# Patient Record
Sex: Female | Born: 1960 | Race: Black or African American | Hispanic: No | Marital: Single | Smoking: Never smoker
Health system: Southern US, Community
[De-identification: ages and names within clinical notes are randomized; demographics above are authoritative.]

## PROBLEM LIST (undated history)

## (undated) DIAGNOSIS — R0602 Shortness of breath: Secondary | ICD-10-CM

## (undated) DIAGNOSIS — I428 Other cardiomyopathies: Secondary | ICD-10-CM

## (undated) DIAGNOSIS — G4733 Obstructive sleep apnea (adult) (pediatric): Secondary | ICD-10-CM

## (undated) DIAGNOSIS — D649 Anemia, unspecified: Secondary | ICD-10-CM

## (undated) DIAGNOSIS — I517 Cardiomegaly: Secondary | ICD-10-CM

## (undated) DIAGNOSIS — G4730 Sleep apnea, unspecified: Secondary | ICD-10-CM

## (undated) DIAGNOSIS — E119 Type 2 diabetes mellitus without complications: Secondary | ICD-10-CM

## (undated) DIAGNOSIS — M1990 Unspecified osteoarthritis, unspecified site: Secondary | ICD-10-CM

## (undated) DIAGNOSIS — R7303 Prediabetes: Secondary | ICD-10-CM

## (undated) DIAGNOSIS — E785 Hyperlipidemia, unspecified: Secondary | ICD-10-CM

## (undated) DIAGNOSIS — E039 Hypothyroidism, unspecified: Secondary | ICD-10-CM

## (undated) DIAGNOSIS — I447 Left bundle-branch block, unspecified: Secondary | ICD-10-CM

## (undated) DIAGNOSIS — I429 Cardiomyopathy, unspecified: Secondary | ICD-10-CM

## (undated) DIAGNOSIS — I5042 Chronic combined systolic (congestive) and diastolic (congestive) heart failure: Secondary | ICD-10-CM

## (undated) DIAGNOSIS — J45909 Unspecified asthma, uncomplicated: Secondary | ICD-10-CM

## (undated) DIAGNOSIS — I10 Essential (primary) hypertension: Secondary | ICD-10-CM

## (undated) DIAGNOSIS — E663 Overweight: Secondary | ICD-10-CM

## (undated) HISTORY — PX: OVARIAN CYST REMOVAL: SHX89

## (undated) HISTORY — DX: Hypothyroidism, unspecified: E03.9

## (undated) HISTORY — DX: Other cardiomyopathies: I42.8

## (undated) HISTORY — DX: Unspecified osteoarthritis, unspecified site: M19.90

## (undated) HISTORY — DX: Type 2 diabetes mellitus without complications: E11.9

## (undated) HISTORY — PX: ACHILLES TENDON SURGERY: SHX542

## (undated) HISTORY — DX: Overweight: E66.3

## (undated) HISTORY — DX: Sleep apnea, unspecified: G47.30

## (undated) HISTORY — DX: Shortness of breath: R06.02

## (undated) HISTORY — DX: Unspecified asthma, uncomplicated: J45.909

## (undated) HISTORY — DX: Left bundle-branch block, unspecified: I44.7

## (undated) HISTORY — DX: Chronic combined systolic (congestive) and diastolic (congestive) heart failure: I50.42

## (undated) HISTORY — DX: Cardiomyopathy, unspecified: I42.9

## (undated) HISTORY — PX: KNEE ARTHROSCOPY WITH MENISCAL REPAIR: SHX5653

## (undated) HISTORY — DX: Essential (primary) hypertension: I10

## (undated) HISTORY — DX: Prediabetes: R73.03

## (undated) HISTORY — DX: Anemia, unspecified: D64.9

## (undated) HISTORY — DX: Hypercalcemia: E83.52

## (undated) HISTORY — DX: Obstructive sleep apnea (adult) (pediatric): G47.33

## (undated) HISTORY — DX: Cardiomegaly: I51.7

## (undated) HISTORY — DX: Hyperlipidemia, unspecified: E78.5

---

## 2015-06-27 ENCOUNTER — Other Ambulatory Visit: Payer: Self-pay

## 2015-06-27 DIAGNOSIS — Z1231 Encounter for screening mammogram for malignant neoplasm of breast: Secondary | ICD-10-CM

## 2015-07-25 ENCOUNTER — Ambulatory Visit
Admission: RE | Admit: 2015-07-25 | Discharge: 2015-07-25 | Disposition: A | Discharge status: Home / Self Care | Payer: 59 | Source: Ambulatory Visit

## 2015-07-25 DIAGNOSIS — Z1231 Encounter for screening mammogram for malignant neoplasm of breast: Secondary | ICD-10-CM

## 2016-09-12 ENCOUNTER — Other Ambulatory Visit: Payer: Self-pay | Admitting: Family Medicine

## 2016-09-12 DIAGNOSIS — Z1231 Encounter for screening mammogram for malignant neoplasm of breast: Secondary | ICD-10-CM

## 2016-10-09 ENCOUNTER — Ambulatory Visit
Admission: RE | Admit: 2016-10-09 | Discharge: 2016-10-09 | Disposition: A | Discharge status: Home / Self Care | Payer: 59 | Source: Ambulatory Visit | Attending: Family Medicine | Admitting: Family Medicine

## 2016-10-09 DIAGNOSIS — Z1231 Encounter for screening mammogram for malignant neoplasm of breast: Secondary | ICD-10-CM

## 2017-03-14 ENCOUNTER — Other Ambulatory Visit: Payer: Self-pay | Admitting: Family Medicine

## 2017-03-14 DIAGNOSIS — R221 Localized swelling, mass and lump, neck: Secondary | ICD-10-CM

## 2017-03-18 ENCOUNTER — Ambulatory Visit
Admission: RE | Admit: 2017-03-18 | Discharge: 2017-03-18 | Disposition: A | Discharge status: Home / Self Care | Payer: 59 | Source: Ambulatory Visit | Attending: Family Medicine | Admitting: Family Medicine

## 2017-03-18 DIAGNOSIS — R221 Localized swelling, mass and lump, neck: Secondary | ICD-10-CM

## 2017-03-18 MED ORDER — IOPAMIDOL (ISOVUE-300) INJECTION 61%
75.0000 mL | Freq: Once | INTRAVENOUS | Status: AC | PRN
  Administered 2017-03-18: 75 mL via INTRAVENOUS

## 2017-04-21 ENCOUNTER — Encounter: Payer: Self-pay | Admitting: Dietician

## 2017-04-21 ENCOUNTER — Encounter: Payer: 59 | Attending: Family Medicine | Admitting: Dietician

## 2017-04-21 DIAGNOSIS — Z713 Dietary counseling and surveillance: Secondary | ICD-10-CM | POA: Diagnosis not present

## 2017-04-21 DIAGNOSIS — E7800 Pure hypercholesterolemia, unspecified: Secondary | ICD-10-CM | POA: Diagnosis not present

## 2017-04-21 DIAGNOSIS — R7303 Prediabetes: Secondary | ICD-10-CM | POA: Diagnosis not present

## 2017-04-21 DIAGNOSIS — I10 Essential (primary) hypertension: Secondary | ICD-10-CM | POA: Insufficient documentation

## 2017-04-21 NOTE — Patient Instructions (Signed)
Rethink what you drink.  Be sure to stay hydrated. Continue to stay active Bake, broil, grill rather than fry. Eat slowly.  Plan:  Aim for 3 Carb Choices per meal (45 grams) +/- 1 either way  Aim for 0-1 Carbs per snack if hungry  Include protein in moderation with your meals and snacks Consider reading food labels for Total Carbohydrate and Fat Grams of foods

## 2017-04-21 NOTE — Progress Notes (Signed)
  Medical Nutrition Therapy:  Appt start time: 1308 end time:  1630.   Assessment:  Primary concerns today: Patient is here today with her husband.  She would like to lose weight.  She has been recently diagnosed with prediabetes with an A1C of 6.4%.  She is going for a sleep study this week to evaluate OSA.  Other hx includes hypothyroid.  She states that her medication has been increased but she does not feel better.  She also has HTN and high cholesterol.    Weight hx: 198 lbs today and highest weight 190 lbs 09/2014 and lost 20 lbs within 6 months of moving  She made no changes and does not know how she lost weight. Regained summer 2016. Goal 140-150 lbs.  Patient lives with her husband.  They share shopping and cooking.  She works for the [employer].  She gets an average of 12,000 steps daily at work.  Her husband has diabetes.  Preferred Learning Style:   No preference indicated   Learning Readiness:   Not ready  Contemplating  Ready  Change in progress   MEDICATIONS: see list   DIETARY INTAKE:  Usual eating pattern includes 3 meals and 0 snacks per day. 24-hr recall:  B (8:30 AM):  Plain instant oatmeal with liquid creamer, yogurt  Snk (10:30AM): Fig bar or granola bar, cup of hot tea with lemon and equal  L ( PM): leftovers OR [place] sandwich, chips Snk ( PM): none D ( PM): Meat, starch, vegetable Snk ( PM): none Beverages: Armold Palmar iced tea, water, rare juice  Usual physical activity: none   Progress Towards Goal(s):  In progress.   Nutritional Diagnosis:  NB-1.1 Food and nutrition-related knowledge deficit As related to prediabetes.  As evidenced by patient report and diet hx.    Intervention:  Nutrition counseling and diabetes education initiated. Discussed Carb Counting by food group as method of portion control, reading food labels, and benefits of continued increased activity. Also discussed basic physiology of Diabetes, target BG ranges pre and post  meals, and A1c.   Rethink what you drink.  Be sure to stay hydrated. Continue to stay active Bake, broil, grill rather than fry. Eat slowly.  Plan:  Aim for 3 Carb Choices per meal (45 grams) +/- 1 either way  Aim for 0-1 Carbs per snack if hungry  Include protein in moderation with your meals and snacks Consider reading food labels for Total Carbohydrate and Fat Grams of foods  Teaching Method Utilized:  Visual Auditory Hands on  Handouts given during visit include: Living Well with Diabetes handouts Food Label handouts Meal Plan Card  My plate  M5H sheet  Barriers to learning/adherence to lifestyle change: long hours  Demonstrated degree of understanding via:  Teach Back   Monitoring/Evaluation:  Dietary intake, exercise, label reading, and body weight prn.

## 2017-06-09 ENCOUNTER — Emergency Department (HOSPITAL_BASED_OUTPATIENT_CLINIC_OR_DEPARTMENT_OTHER)
Admission: EM | Admit: 2017-06-09 | Discharge: 2017-06-09 | Disposition: A | Discharge status: Home / Self Care | Payer: 59 | Attending: Emergency Medicine | Admitting: Emergency Medicine

## 2017-06-09 ENCOUNTER — Emergency Department (HOSPITAL_BASED_OUTPATIENT_CLINIC_OR_DEPARTMENT_OTHER): Payer: 59

## 2017-06-09 ENCOUNTER — Encounter (HOSPITAL_BASED_OUTPATIENT_CLINIC_OR_DEPARTMENT_OTHER): Payer: Self-pay | Admitting: *Deleted

## 2017-06-09 DIAGNOSIS — R42 Dizziness and giddiness: Secondary | ICD-10-CM | POA: Diagnosis present

## 2017-06-09 DIAGNOSIS — Z79899 Other long term (current) drug therapy: Secondary | ICD-10-CM | POA: Insufficient documentation

## 2017-06-09 DIAGNOSIS — J45909 Unspecified asthma, uncomplicated: Secondary | ICD-10-CM | POA: Insufficient documentation

## 2017-06-09 DIAGNOSIS — I10 Essential (primary) hypertension: Secondary | ICD-10-CM | POA: Insufficient documentation

## 2017-06-09 DIAGNOSIS — R1110 Vomiting, unspecified: Secondary | ICD-10-CM | POA: Insufficient documentation

## 2017-06-09 LAB — CBC WITH DIFFERENTIAL/PLATELET
BASOS ABS: 0 10*3/uL (ref 0.0–0.1)
BASOS PCT: 0 %
EOS ABS: 0 10*3/uL (ref 0.0–0.7)
Eosinophils Relative: 0 %
HEMATOCRIT: 40.9 % (ref 36.0–46.0)
HEMOGLOBIN: 13.6 g/dL (ref 12.0–15.0)
Lymphocytes Relative: 9 %
Lymphs Abs: 0.8 10*3/uL (ref 0.7–4.0)
MCH: 27.6 pg (ref 26.0–34.0)
MCHC: 33.3 g/dL (ref 30.0–36.0)
MCV: 83.1 fL (ref 78.0–100.0)
MONO ABS: 0.2 10*3/uL (ref 0.1–1.0)
MONOS PCT: 2 %
NEUTROS PCT: 89 %
Neutro Abs: 7.5 10*3/uL (ref 1.7–7.7)
Platelets: 312 10*3/uL (ref 150–400)
RBC: 4.92 MIL/uL (ref 3.87–5.11)
RDW: 14.9 % (ref 11.5–15.5)
WBC: 8.5 10*3/uL (ref 4.0–10.5)

## 2017-06-09 LAB — BASIC METABOLIC PANEL
Anion gap: 9 (ref 5–15)
BUN: 17 mg/dL (ref 6–20)
CHLORIDE: 104 mmol/L (ref 101–111)
CO2: 23 mmol/L (ref 22–32)
CREATININE: 0.94 mg/dL (ref 0.44–1.00)
Calcium: 10.1 mg/dL (ref 8.9–10.3)
GFR calc Af Amer: >60 mL/min (ref >60)
GFR calc non Af Amer: >60 mL/min (ref >60)
GLUCOSE: 138 mg/dL — AB (ref 65–99)
Potassium: 3.9 mmol/L (ref 3.5–5.1)
Sodium: 136 mmol/L (ref 135–145)

## 2017-06-09 MED ORDER — ONDANSETRON HCL 4 MG/2ML IJ SOLN
4.0000 mg | Freq: Once | INTRAMUSCULAR | Status: AC
  Administered 2017-06-09: 4 mg via INTRAVENOUS
  Filled 2017-06-09: qty 2

## 2017-06-09 MED ORDER — MECLIZINE HCL 25 MG PO TABS
25.0000 mg | ORAL_TABLET | Freq: Once | ORAL | Status: AC
Start: 2017-06-09 — End: 2017-06-09
  Administered 2017-06-09: 25 mg via ORAL
  Filled 2017-06-09: qty 1

## 2017-06-09 MED ORDER — SODIUM CHLORIDE 0.9 % IV SOLN: INTRAVENOUS | Status: DC

## 2017-06-09 MED ORDER — SODIUM CHLORIDE 0.9 % IV BOLUS (SEPSIS)
250.0000 mL | Freq: Once | INTRAVENOUS | Status: AC
  Administered 2017-06-09: 250 mL via INTRAVENOUS

## 2017-06-09 MED ORDER — MECLIZINE HCL 25 MG PO TABS: 25.0000 mg | ORAL_TABLET | Freq: Three times a day (TID) | ORAL | 0 refills | Status: AC | PRN

## 2017-06-09 NOTE — ED Provider Notes (Signed)
[facility] Provider Note   CSN: [id number] Arrival date & time: 06/09/17  1726     History   Chief Complaint Chief Complaint  Patient presents with  . Emesis  . Dizziness    HPI Lindsay Hayden is a 56 y.o. female.  Patient with the acute onset of room spinning and vomiting that started at 11:30 this morning. Was not present when she first got up. Associated with a very mild headache. Associated with several episodes of nausea and vomiting. Patient does better if she lays still. Patient denies any speech problems or any significant weakness or numbness. Patient without similar history in the past. Patient does have the risk factors of hypertension and hyperlipidemia and prediabetes.  Patient when laying still still feels much better. Patient denies any fevers. Denies any upper respiratory infection symptoms. Denies any ringing in her ears. No injury or fall.      Past Medical History:  Diagnosis Date  . Asthma   . Hyperlipidemia   . Hypertension   . OSA (obstructive sleep apnea)   . Prediabetes     There are no active problems to display for this patient.   History reviewed. No pertinent surgical history.  OB History    No data available       Home Medications    Prior to Admission medications   Medication Sig Start Date End Date Taking? Authorizing Provider  albuterol (PROVENTIL HFA;VENTOLIN HFA) 108 (90 Base) MCG/ACT inhaler Inhale into the lungs every 6 (six) hours as needed for wheezing or shortness of breath.    [provider]  atorvastatin (LIPITOR) 10 MG tablet Take 10 mg by mouth daily.    [provider]  cholecalciferol (VITAMIN D) 1000 units tablet Take 5,000 Units by mouth daily.    [provider]  enalapril (VASOTEC) 20 MG tablet Take 20 mg by mouth daily.    [provider]  furosemide (LASIX) 20 MG tablet Take 20 mg by mouth.    [provider]  hydrALAZINE (APRESOLINE) 25 MG tablet  Take 25 mg by mouth 3 (three) times daily.    [provider]  levothyroxine (SYNTHROID, LEVOTHROID) 75 MCG tablet Take 75 mcg by mouth daily before breakfast.    [provider]  meclizine (ANTIVERT) 25 MG tablet Take 1 tablet (25 mg total) by mouth 3 (three) times daily as needed for dizziness. 06/09/17   Fredia Sorrow, MD  metoprolol succinate (TOPROL-XL) 25 MG 24 hr tablet Take 25 mg by mouth daily.    [provider]  spironolactone (ALDACTONE) 25 MG tablet Take 25 mg by mouth daily.    [provider]    Family History No family history on file.  Social History Social History  Substance Use Topics  . Smoking status: Never Smoker  . Smokeless tobacco: Never Used  . Alcohol use Yes     Allergies   Patient has no known allergies.   Review of Systems Review of Systems  Constitutional: Negative for fatigue and fever.  HENT: Negative for congestion and sore throat.   Eyes: Negative for redness.  Respiratory: Negative for shortness of breath.   Cardiovascular: Negative for chest pain.  Gastrointestinal: Positive for nausea and vomiting. Negative for abdominal pain.  Genitourinary: Negative for dysuria.  Musculoskeletal: Negative for back pain and neck pain.  Skin: Negative for rash.  Neurological: Positive for dizziness and headaches. Negative for facial asymmetry, speech difficulty, weakness and numbness.  Hematological: Does not bruise/bleed easily.  Psychiatric/Behavioral: Negative for confusion.     Physical Exam Updated Vital Signs BP (!) 180/86   Pulse 64   Temp 98.1 F (36.7 C) (Oral)   Resp 16   Ht 1.575 m (5\' 2" )   Wt 87.1 kg (192 lb)   SpO2 97%   BMI 35.12 kg/m   Physical Exam  Constitutional: She is oriented to person, place, and time. She appears well-developed and well-nourished. No distress.  HENT:  Head: Normocephalic and atraumatic.  Mouth/Throat: Oropharynx is clear and moist.  Eyes: Pupils are equal,  round, and reactive to light. Conjunctivae and EOM are normal.  Neck: Normal range of motion. Neck supple.  Cardiovascular: Normal rate, regular rhythm and normal heart sounds.   Pulmonary/Chest: Effort normal and breath sounds normal. No respiratory distress.  Abdominal: Soft. Bowel sounds are normal. There is no tenderness.  Musculoskeletal: Normal range of motion.  Neurological: She is alert and oriented to person, place, and time. No cranial nerve deficit or sensory deficit. She exhibits normal muscle tone. Coordination normal.  Reproducible dizziness room spinning with moving head left to right. Also with movement of eyes following a crack emotions.  Skin: Skin is warm.  Nursing note and vitals reviewed.    ED Treatments / Results  Labs (all labs ordered are listed, but only abnormal results are displayed) Labs Reviewed  BASIC METABOLIC PANEL - Abnormal; Notable for the following:       Result Value   Glucose, Bld 138 (*)    All other components within normal limits  CBC WITH DIFFERENTIAL/PLATELET    EKG  EKG Interpretation None       Radiology Ct Head Wo Contrast  Result Date: 06/09/2017 CLINICAL DATA:  Ataxia with frontal headache EXAM: CT HEAD WITHOUT CONTRAST TECHNIQUE: Contiguous axial images were obtained from the base of the skull through the vertex without intravenous contrast. COMPARISON:  None. FINDINGS: Brain: No acute territorial infarction, hemorrhage or intracranial mass is seen. Scattered hypodensity within the bilateral white matter suggestive of small vessel ischemic change. The ventricles are nonenlarged. Vascular: No hyperdense vessels.  No unexpected calcification Skull: Normal. Negative for fracture or focal lesion. Sinuses/Orbits: Mild mucosal thickening in the ethmoid sinuses. No acute orbital abnormality Other: None IMPRESSION: No CT evidence for acute intracranial abnormality Electronically Signed   By: Donavan Foil M.D.   On: 06/09/2017 19:29     Procedures Procedures (including critical care time)  Medications Ordered in ED Medications  0.9 %  sodium chloride infusion (not administered)  sodium chloride 0.9 % bolus 250 mL (0 mLs Intravenous Stopped 06/09/17 2019)  ondansetron (ZOFRAN) injection 4 mg (4 mg Intravenous Given 06/09/17 1912)  meclizine (ANTIVERT) tablet 25 mg (25 mg Oral Given 06/09/17 1912)     Initial Impression / Assessment and Plan / ED Course  I have reviewed the triage vital signs and the nursing notes.  Pertinent labs & imaging results that were available during my care of the patient were reviewed by me and considered in my medical decision making (see chart for details).     Cardiac monitor without any arrhythmia.  Patient symptoms consistent with vertigo. Head CT negative. Patient with resolution of symptoms with meclizine orally. Able to ambulate without difficulty. As discussed with patient not small stroke is not been completely ruled out. Do not feel the patient needs to be sent in for MRI. She will follow-up with her doctors and also given referral to neurology. Will return for any new or worse symptoms.  Will continue on them meclizine.  Final Clinical Impressions(s) / ED Diagnoses   Final diagnoses:  Vertigo    New Prescriptions New Prescriptions   MECLIZINE (ANTIVERT) 25 MG TABLET    Take 1 tablet (25 mg total) by mouth 3 (three) times daily as needed for dizziness.     Fredia Sorrow, MD 06/09/17 2049

## 2017-06-09 NOTE — ED Triage Notes (Signed)
States she has been dizzy since this am. Vomiting. Symptoms all day.

## 2017-06-09 NOTE — ED Notes (Signed)
ED Provider at bedside. 

## 2017-06-09 NOTE — Discharge Instructions (Signed)
It will be important to follow-up with either your doctor or neurology for the vertigo symptoms. May require MRI. Return for any new or worse symptoms. As we discussed small stroke not completely ruled out. Take the meclizine as directed. Rest at home and be careful with walking.

## 2017-06-09 NOTE — ED Notes (Signed)
Pt verbalized understanding of discharge instructions and denies any further questions at this time.   

## 2017-06-09 NOTE — ED Notes (Signed)
Pt ambulated to bathroom with one assist. Slow but steady gait. States she feels better.

## 2017-06-09 NOTE — ED Notes (Signed)
Patient transported to CT 

## 2017-06-13 ENCOUNTER — Encounter: Payer: Self-pay | Admitting: Neurology

## 2017-06-13 ENCOUNTER — Ambulatory Visit (INDEPENDENT_AMBULATORY_CARE_PROVIDER_SITE_OTHER): Payer: 59 | Admitting: Neurology

## 2017-06-13 VITALS — BP 128/78 | HR 72 | Ht 62.0 in | Wt 195.2 lb

## 2017-06-13 DIAGNOSIS — R519 Headache, unspecified: Secondary | ICD-10-CM

## 2017-06-13 DIAGNOSIS — R42 Dizziness and giddiness: Secondary | ICD-10-CM

## 2017-06-13 DIAGNOSIS — R29898 Other symptoms and signs involving the musculoskeletal system: Secondary | ICD-10-CM

## 2017-06-13 DIAGNOSIS — R51 Headache: Secondary | ICD-10-CM | POA: Diagnosis not present

## 2017-06-13 DIAGNOSIS — G4452 New daily persistent headache (NDPH): Secondary | ICD-10-CM | POA: Diagnosis not present

## 2017-06-13 NOTE — Progress Notes (Signed)
NEUROLOGY CONSULTATION NOTE  Lindsay Hayden MRN: [id number] DOB: 05/02/1961  Referring provider: ED referral Primary care provider: Dr. Sabra Heck  Reason for consult:  vertigo  HISTORY OF PRESENT ILLNESS: Lindsay Hayden is a 56 year old right-handed female with asthma, hyperlipidemia, hypertension, OSA and prediabetes who presents for vertigo.  History supplemented by ED note.  On 06/09/17, she developed acute onset of room spinning when she laid down on the bed.  It lasted about a minute or two.  When she got up, she felt nauseous and started to vomit.  She went back to bed and developed a left sided headache lasting 5-10 minutes.  She started developing the spinning again. This cycle kept repeating.  The vertigo was not clearly positional.  There was some blurred vision but no associated double vision, slurred speech, facial droop or unilateral numbness or weakness.  She presented to [facility] ED for further evaluation.  Blood pressure was initially 627O systolic but later read as 180/86.  Labs, including CBC and CMP, were unremarkable.  CT of head was personally reviewed and demonstrated chronic small vessel ischemic changes but no acute abnormality.  She was given meclizine and discharged.  She continued to have some mild dizziness.  She had another bout of vertigo yesterday and woke up today with the headache.  Left side of her face feels "tight".  She does have history of left sided headache, which she associates with high blood pressure.  Sometimes it is associated with photophobia but usually not nausea.  She also has left hand and arm pain.  She has a ganglion cyst on her wrist.  She reports numbness and tingling in the thumb and pain sometimes radiates up the arm.  She says her hand is weak.  She was evaluated by a hand specialist in the past who told her she had carpal tunnel syndrome and recommended surgery, including excision of the cyst.  PAST MEDICAL  HISTORY: Past Medical History:  Diagnosis Date  . Asthma   . Hyperlipidemia   . Hypertension   . OSA (obstructive sleep apnea)   . Prediabetes     PAST SURGICAL HISTORY: Past Surgical History:  Procedure Laterality Date  . ACHILLES TENDON SURGERY     right  . CESAREAN SECTION     x2  . KNEE ARTHROSCOPY WITH MENISCAL REPAIR     right knee  . OVARIAN CYST REMOVAL     right    MEDICATIONS: Current Outpatient Prescriptions on File Prior to Visit  Medication Sig Dispense Refill  . albuterol (PROVENTIL HFA;VENTOLIN HFA) 108 (90 Base) MCG/ACT inhaler Inhale into the lungs every 6 (six) hours as needed for wheezing or shortness of breath.    Marland Kitchen atorvastatin (LIPITOR) 10 MG tablet Take 10 mg by mouth daily.    . cholecalciferol (VITAMIN D) 1000 units tablet Take 5,000 Units by mouth daily.    . enalapril (VASOTEC) 20 MG tablet Take 20 mg by mouth daily.    . furosemide (LASIX) 20 MG tablet Take 20 mg by mouth.    . hydrALAZINE (APRESOLINE) 25 MG tablet Take 25 mg by mouth 3 (three) times daily.    Marland Kitchen levothyroxine (SYNTHROID, LEVOTHROID) 75 MCG tablet Take 75 mcg by mouth daily before breakfast.    . meclizine (ANTIVERT) 25 MG tablet Take 1 tablet (25 mg total) by mouth 3 (three) times daily as needed for dizziness. 30 tablet 0  . metoprolol succinate (TOPROL-XL) 25 MG 24 hr tablet Take 25 mg  by mouth daily.    Marland Kitchen spironolactone (ALDACTONE) 25 MG tablet Take 25 mg by mouth daily.     No current facility-administered medications on file prior to visit.     ALLERGIES: No Known Allergies  FAMILY HISTORY: History reviewed. No pertinent family history.  SOCIAL HISTORY: Social History   Social History  . Marital status: Single    Spouse name: N/A  . Number of children: 2  . Years of education: AD   Occupational History  . mail handler [employer]   Social History Main Topics  . Smoking status: Never Smoker  . Smokeless tobacco: Never Used  . Alcohol use 0.6 oz/week    1 Glasses  of wine per week     Comment: 1-2 times a month  . Drug use: No  . Sexual activity: Not on file   Other Topics Concern  . Not on file   Social History Narrative   Patient is single, lives with boyfriend in a single story home, though does have a full basement that she rarely goes down the stairs to. No pets.    REVIEW OF SYSTEMS: Constitutional: No fevers, chills, or sweats, no generalized fatigue, change in appetite Eyes: No visual changes, double vision, eye pain Ear, nose and throat: No hearing loss, ear pain, nasal congestion, sore throat Cardiovascular: No chest pain, palpitations Respiratory:  No shortness of breath at rest or with exertion, wheezes GastrointestinaI: No nausea, vomiting, diarrhea, abdominal pain, fecal incontinence Genitourinary:  No dysuria, urinary retention or frequency Musculoskeletal:  No neck pain, back pain Integumentary: No rash, pruritus, skin lesions Neurological: as above Psychiatric: No depression, insomnia, anxiety Endocrine: No palpitations, fatigue, diaphoresis, mood swings, change in appetite, change in weight, increased thirst Hematologic/Lymphatic:  No purpura, petechiae. Allergic/Immunologic: no itchy/runny eyes, nasal congestion, recent allergic reactions, rashes  PHYSICAL EXAM: Vitals:   06/13/17 1401  BP: 128/78  Pulse: 72  SpO2: 95%   General: No acute distress.  Patient appears well-groomed.  Head:  Normocephalic/atraumatic Eyes:  fundi examined but not visualized Neck: supple, no paraspinal tenderness, full range of motion Back: No paraspinal tenderness Heart: regular rate and rhythm Lungs: Clear to auscultation bilaterally. Vascular: No carotid bruits. Neurological Exam: Mental status: alert and oriented to person, place, and time, recent and remote memory intact, fund of knowledge intact, attention and concentration intact, speech fluent and not dysarthric, language intact. Cranial nerves: CN I: not tested CN II: pupils  equal, round and reactive to light, visual fields intact CN III, IV, VI:  full range of motion, no nystagmus, no ptosis CN V: facial sensation intact CN VII: upper and lower face symmetric CN VIII: hearing intact CN IX, X: gag intact, uvula midline CN XI: sternocleidomastoid and trapezius muscles intact CN XII: tongue midline Bulk & Tone: normal, no fasciculations. Motor:  5/5 throughout  Sensation: temperature and vibration sensation intact. Deep Tendon Reflexes:  2+ throughout, toes downgoing.  Finger to nose testing:  Without dysmetria.  Heel to shin:  Without dysmetria.  Gait:  Normal station and stride.  Able to turn, tandem walk slow and cautious. Romberg negative. Dix-Hallpike and Head Impulse Test were both negative but quick head turn to the right elicited the vertigo.  IMPRESSION: Vertigo with left sided headache.  Vertigo may be BPPV, although not classic presentation.  Headache may be migraine triggered by the vertigo.  However, given her uncommon presentation, and continuation of symptoms, Elevated blood pressure in the ED and co-morbidities, I would perform further workup.  PLAN: 1.  MRI and MRA of head to evaluate for vertebrobasilar stroke 2.  Further recommendations pending results.  If MRI unremarkable, I would recommend vestibular rehabilitation if symptoms not improved. 3.  Recommended she revisit hand specialist as the ganglion cyst is causing pain.  Thank you for allowing me to take part in the care of this patient.  Metta Clines, DO  CC:  Kathyrn Lass, MD

## 2017-06-13 NOTE — Patient Instructions (Signed)
1.  We will get MRI and MRA of head  2.  Further recommendations pending results.  If MRI unremarkable, I would refer you to physical therapy

## 2017-06-30 ENCOUNTER — Ambulatory Visit
Admission: RE | Admit: 2017-06-30 | Discharge: 2017-06-30 | Disposition: A | Discharge status: Home / Self Care | Payer: 59 | Source: Ambulatory Visit | Attending: Neurology | Admitting: Neurology

## 2017-06-30 DIAGNOSIS — R51 Headache: Secondary | ICD-10-CM

## 2017-06-30 DIAGNOSIS — R519 Headache, unspecified: Secondary | ICD-10-CM

## 2017-06-30 DIAGNOSIS — R29898 Other symptoms and signs involving the musculoskeletal system: Secondary | ICD-10-CM

## 2017-06-30 DIAGNOSIS — G4452 New daily persistent headache (NDPH): Secondary | ICD-10-CM

## 2017-06-30 DIAGNOSIS — R42 Dizziness and giddiness: Secondary | ICD-10-CM

## 2017-07-02 ENCOUNTER — Other Ambulatory Visit: Payer: Self-pay

## 2017-07-02 ENCOUNTER — Telehealth: Payer: Self-pay

## 2017-07-02 DIAGNOSIS — R42 Dizziness and giddiness: Secondary | ICD-10-CM

## 2017-07-02 NOTE — Telephone Encounter (Signed)
Called Pt, advsd of MRI results, Pt asked what she should do now, advsd her Dr Tomi Likens recommended vestibular rehab and they will contact her directly-put in orders

## 2017-07-02 NOTE — Telephone Encounter (Signed)
-----   Message from Pieter Partridge, DO sent at 07/01/2017  8:09 AM EDT ----- MRI does not reveal anything concerning that would cause the dizziness and headache.  There are no tumors, strokes, bleeds or aneurysms.  It shows some chronic changes in the brain that is often seen in people with history of high cholesterol and high blood pressure.

## 2017-09-24 ENCOUNTER — Ambulatory Visit: Payer: POS | Attending: Neurology | Admitting: Rehabilitative and Restorative Service Providers"

## 2017-09-24 VITALS — BP 145/85

## 2017-09-24 DIAGNOSIS — R2681 Unsteadiness on feet: Secondary | ICD-10-CM

## 2017-09-24 DIAGNOSIS — R42 Dizziness and giddiness: Secondary | ICD-10-CM | POA: Diagnosis present

## 2017-09-24 NOTE — Therapy (Signed)
[facility] [address] [place], [place], [address] Phone: [phone]   Fax:  [phone]  Physical Therapy Evaluation  Patient Details  Name: Lindsay Hayden MRN: [id number] Date of Birth: 10/24/1960 Referring Provider: Metta Clines, DO   Encounter Date: 09/24/2017  PT End of Session - 09/24/17 1418    Visit Number  1    Number of Visits  4    Date for PT Re-Evaluation  11/08/17    Authorization Type  private insurance    PT Start Time  6812    PT Stop Time  1320    PT Time Calculation (min)  45 min    Activity Tolerance  Patient tolerated treatment well;Treatment limited secondary to medical complications (Comment) headache developed during evaluation    Behavior During Therapy  [facility] for tasks assessed/performed       Past Medical History:  Diagnosis Date  . Asthma   . Hyperlipidemia   . Hypertension   . OSA (obstructive sleep apnea)   . Prediabetes     Past Surgical History:  Procedure Laterality Date  . ACHILLES TENDON SURGERY     right  . CESAREAN SECTION     x2  . KNEE ARTHROSCOPY WITH MENISCAL REPAIR     right knee  . OVARIAN CYST REMOVAL     right    Vitals:   09/24/17 1239  BP: (!) 145/85     Subjective Assessment - 09/24/17 1239    Subjective  The patient notes sudden onset of dizziness 06/09/17 reporting unsteadiness upon waking.  She went back to bed, noted her taste was off (for her morning tea), developed nausea/vomitting, and felt her eyes were moving side to side quickly with room spinning.  She describes a "amusement park ride".  The initial episode lasted for a minut but "felt like forever".    After room spinning stopped, she noted head pressure and more nausea/vomitting.   When she would change positions, the room wold spin again.  She had intermittent n/v throughout the day and intermittent spells of vertigo.  At this time, she describes her symptoms as "something is off" with  quick head motion.  She reports sensation that things are going by really quick with fast head motion.  She notes the meclizine makes her tired and she reports "I'm no good, like a Zombie".      Pertinent History  HTN, hyperlipidemia, sleep apnea, "prediabetes" listed in history.    Patient Stated Goals  Stop the dizziness.     Currently in Pain?  Yes    Pain Score  -- "slight headache" like my head feels tight    Pain Location  Head    Pain Descriptors / Indicators  Headache    Pain Type  Chronic pain HA have increased since onset of vertigo    Pain Onset  More than a month ago    Pain Frequency  Intermittent    Aggravating Factors   varies    Pain Relieving Factors  unsure    Effect of Pain on Daily Activities  Reports HA every other day frequency.          [facility] PT Assessment - 09/24/17 1252      Assessment   Medical Diagnosis  Dizziness    Referring Provider  Metta Clines, DO    Onset Date/Surgical Date  06/09/17    Prior Therapy  none      Precautions   Precautions  None  Restrictions   Weight Bearing Restrictions  No      Balance Screen   Has the patient fallen in the past 6 months  No    Has the patient had a decrease in activity level because of a fear of falling?   Yes    Is the patient reluctant to leave their home because of a fear of falling?   No      Home Film/video editor residence    Living Arrangements  -- lives with boyfriend    Type of [place] Access  -- steps to basement      Prior Function   Level of Independence  Independent    Vocation  Full time employment    Vocation Requirements  on feet all day         Vestibular Assessment - 09/24/17 1259      Vestibular Assessment   General Observation  no dizziness at rest; walks into clinic at slowed pace indep without device      Symptom Behavior   Type of Dizziness  Imbalance spinning in acute phase of oinset    Frequency of Dizziness  daily    Duration  of Dizziness  seconds to minutes    Aggravating Factors  Activity in general;Turning head quickly    Relieving Factors  Head stationary      Occulomotor Exam   Occulomotor Alignment  Normal    Spontaneous  Absent    Gaze-induced  Absent    Smooth Pursuits  Intact    Saccades  Intact      Vestibulo-Occular Reflex   VOR 1 Head Only (x 1 viewing)  Slow gaze x 1=5 reps provokes a sensation of fast visual movement rated a 4/10    VOR Cancellation  Normal    Comment  Head impulse test=patient initially shuts her eyes and has a guarded head position after attempting HiT.  PT had her try with eyes opened.  She was able to maintain gaze, but noted anxiety due to feeling like spinning could come on.       Visual Acuity   Static  line 3    Dynamic  line 8      Positional Testing   Dix-Hallpike  Dix-Hallpike Right;Dix-Hallpike Left    Horizontal Canal Testing  Horizontal Canal Right;Horizontal Canal Left      Dix-Hallpike Right   Dix-Hallpike Right Duration  0    Dix-Hallpike Right Symptoms  No nystagmus      Dix-Hallpike Left   Dix-Hallpike Left Duration  0    Dix-Hallpike Left Symptoms  No nystagmus      Horizontal Canal Right   Horizontal Canal Right Duration  0    Horizontal Canal Right Symptoms  Normal      Horizontal Canal Left   Horizontal Canal Left Duration  0    Horizontal Canal Left Symptoms  Normal      Positional Sensitivities   Head Turning x 5  Lightheadedness lightheaded "whoozy" sensation; took 11.2 sec to complete    Head Nodding x 5  No dizziness provokes HA (or building up of all prior assessments); 8 sec    Positional Sensitivities Comments  motion sensitive with static versus dynamic visual acuity         Objective measurements completed on examination: See above findings.              PT Education - 09/24/17 1411  Education provided  Yes    Education Details  goals of treatment, role of VOR, treatment for motion sensitivity    Person(s)  Educated  Patient    Methods  Explanation    Comprehension  Verbalized understanding          PT Long Term Goals - 09/24/17 1419      PT LONG TERM GOAL #1   Title  The patient will be indep with HEP for gaze adaptation and motion sensitivity.    Time  4    Period  Weeks    Target Date  11/08/17      PT LONG TERM GOAL #2   Title  The patient will tolerate VOR x 1 gaze adaptation x 30 seconds nonstop with c/o dizziness, or other symptoms < or equal to 2/10.    Time  4    Period  Weeks    Target Date  11/08/17      PT LONG TERM GOAL #3   Title  The patient will improve SVA vs DVA to < or equal to 3 lines to demo improving VOR.    Time  4    Period  Weeks    Target Date  11/08/17      PT LONG TERM GOAL #4   Title  The patient will tolerate head motion horizontal plane x 10 reps with dizziness < or equal to 2/10.    Time  4    Period  Weeks    Target Date  11/08/17      PT LONG TERM GOAL #5   Title  The patient will improve functional status score from 71% to 85% to demo improving perception of functional mobility.    Time  4    Period  Weeks    Target Date  11/08/17             Plan - 09/24/17 1314    Clinical Impression Statement  The patient is a 57 year old female presenting to OP PT with diminsihed VOR per provocation of symptoms with head impulse testing (guarding and eyes closed diminished test accuracy), 5 line difference on static versus dynamic visual acuity, and motion sensitivity.   Examination ended due to onset of 6/10 HA with light sensitivity.    PT to establish HEP for patient to progress VOR and motion tolerance.     Clinical Presentation  Stable    Clinical Decision Making  Low    Rehab Potential  Good    Clinical Impairments Affecting Rehab Potential  headaches    PT Frequency  1x / week    PT Duration  4 weeks    PT Treatment/Interventions  ADLs/Self Care Home Management;Balance training;Neuromuscular re-education;Patient/family education;Gait  training;Vestibular;Therapeutic exercise;Therapeutic activities;Functional mobility training    PT Next Visit Plan  Establish HEP for VOR, motion sensitivty, and balance as indicated.  Provide instruction on modifying as needed for tolerance and to limit HA    Consulted and Agree with Plan of Care  Patient       Patient will benefit from skilled therapeutic intervention in order to improve the following deficits and impairments:  Decreased activity tolerance, Dizziness, Decreased balance  Visit Diagnosis: Dizziness and giddiness  Unsteadiness on feet     Problem List There are no active problems to display for this patient.   [place], [place] 09/24/2017, 2:32 PM  [facility] [address] [place], [place], [address] Phone: [phone]   Fax:  [phone]  Name: Lindsay Hayden MRN: [id number] Date of Birth: [birth year]

## 2017-10-03 ENCOUNTER — Ambulatory Visit: Payer: POS | Admitting: Rehabilitative and Restorative Service Providers"

## 2017-10-03 ENCOUNTER — Encounter: Payer: Self-pay | Admitting: Rehabilitative and Restorative Service Providers"

## 2017-10-03 DIAGNOSIS — R2681 Unsteadiness on feet: Secondary | ICD-10-CM

## 2017-10-03 DIAGNOSIS — R42 Dizziness and giddiness: Secondary | ICD-10-CM | POA: Diagnosis not present

## 2017-10-03 NOTE — Therapy (Signed)
[facility] [address] [place], [place], [address] Phone: [phone]   Fax:  [phone]  Physical Therapy Treatment  Patient Details  Name: Lindsay Hayden MRN: [id number] Date of Birth: 05/18/1961 Referring Provider: Metta Clines, DO   Encounter Date: 10/03/2017  PT End of Session - 10/03/17 1353    Visit Number  2    Number of Visits  4    Date for PT Re-Evaluation  11/08/17    Authorization Type  private insurance    PT Start Time  1321    PT Stop Time  1350    PT Time Calculation (min)  29 min    Activity Tolerance  Patient tolerated treatment well    Behavior During Therapy  [facility] for tasks assessed/performed       Past Medical History:  Diagnosis Date  . Asthma   . Hyperlipidemia   . Hypertension   . OSA (obstructive sleep apnea)   . Prediabetes     Past Surgical History:  Procedure Laterality Date  . ACHILLES TENDON SURGERY     right  . CESAREAN SECTION     x2  . KNEE ARTHROSCOPY WITH MENISCAL REPAIR     right knee  . OVARIAN CYST REMOVAL     right    There were no vitals filed for this visit.  Subjective Assessment - 10/03/17 1323    Subjective  The patient notes that she felt nystagmus "my eyes went like that" (describing jumping of her eyes side to side) 2 times this week when filling up pill box.      Pertinent History  HTN, hyperlipidemia, sleep apnea, "prediabetes" listed in history.    Patient Stated Goals  Stop the dizziness.     Currently in Pain?  No/denies                       Vestibular Treatment/Exercise - 10/03/17 1325      Vestibular Treatment/Exercise   Vestibular Treatment Provided  Gaze;Habituation    Habituation Exercises  Standing Horizontal Head Turns;Standing Vertical Head Turns;180 degree Turns    Gaze Exercises  X1 Viewing Horizontal;X1 Viewing Vertical      Standing Horizontal Head Turns   Number of Reps   10    Symptom Description    notes mild imbalance; not provoking dizziness today      Standing Vertical Head Turns   Number of Reps   10    Symptom Description   no dizziness      180 degree Turns   Number of Reps   5    Symptom Description   no increase in dizziness today.      X1 Viewing Horizontal   Foot Position  standing feet apart    Reps  3    Comments  initially performed 12 reps, rested, then 15 seconds with sensation of head movement sensation; then 30 seconds with cues to slow speed once movement sensation begins      X1 Viewing Vertical   Foot Position  standing feet apart    Reps  1    Comments  x 30 seconds            PT Education - 10/03/17 1353    Education provided  Yes    Education Details  HEP: provided gaze x 1 viewing, habituation with head motion standing, eyes closed + compliant for balance; discussed home walking program    Person(s) Educated  Patient    Methods  Explanation;Demonstration;Handout    Comprehension  Verbalized understanding;Returned demonstration          PT Long Term Goals - 09/24/17 1419      PT LONG TERM GOAL #1   Title  The patient will be indep with HEP for gaze adaptation and motion sensitivity.    Time  4    Period  Weeks    Target Date  11/08/17      PT LONG TERM GOAL #2   Title  The patient will tolerate VOR x 1 gaze adaptation x 30 seconds nonstop with c/o dizziness, or other symptoms < or equal to 2/10.    Time  4    Period  Weeks    Target Date  11/08/17      PT LONG TERM GOAL #3   Title  The patient will improve SVA vs DVA to < or equal to 3 lines to demo improving VOR.    Time  4    Period  Weeks    Target Date  11/08/17      PT LONG TERM GOAL #4   Title  The patient will tolerate head motion horizontal plane x 10 reps with dizziness < or equal to 2/10.    Time  4    Period  Weeks    Target Date  11/08/17      PT LONG TERM GOAL #5   Title  The patient will improve functional status score from 71% to 85% to demo improving  perception of functional mobility.    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  She tolerates gaze and habituation activities well.  Plan to check LTGs next visit to determine further needs.     PT Treatment/Interventions  ADLs/Self Care Home Management;Balance training;Neuromuscular re-education;Patient/family education;Gait training;Vestibular;Therapeutic exercise;Therapeutic activities;Functional mobility training    PT Next Visit Plan  check HEP, motion sensitivity, check LTGs.  Monitor HA    Consulted and Agree with Plan of Care  Patient       Patient will benefit from skilled therapeutic intervention in order to improve the following deficits and impairments:  Decreased activity tolerance, Dizziness, Decreased balance  Visit Diagnosis: Dizziness and giddiness  Unsteadiness on feet     Problem List There are no active problems to display for this patient.   [place], [place] 10/03/2017, 1:55 PM  [facility] [address] [place], [place], [address] Phone: [phone]   Fax:  [phone]  Name: Zyara Riling MRN: [id number] Date of Birth: 07/05/1961

## 2017-10-03 NOTE — Patient Instructions (Signed)
Gaze Stabilization - Tip Card  1.Target must remain in focus, not blurry, and appear stationary while head is in motion. 2.Perform exercises with small head movements (45 to either side of midline). 3.Increase speed of head motion so long as target is in focus. 4.If you wear eyeglasses, be sure you can see target through lens (therapist will give specific instructions for bifocal / progressive lenses). 5.These exercises may provoke dizziness or nausea. Work through these symptoms. If too dizzy, slow head movement slightly. Rest between each exercise. 6.Exercises demand concentration; avoid distractions. 7.For safety, perform standing exercises close to a counter, wall, corner, or next to someone.  Copyright  VHI. All rights reserved.   Gaze Stabilization - Standing Feet Apart   Feet shoulder width apart, keeping eyes on target on wall 3 feet away, tilt head down slightly and move head side to side for 30 seconds. Repeat while moving head up and down for 30 seconds. *Work up to tolerating 60 seconds, as able. Do 2-3 sessions per day.  DO NOT ALLOW DIZZINESS TO GET > 3/10.  iF IT DOES, STOP AND LET SYMPTOMS SETTLE.   Copyright  VHI. All rights reserved.   Feet Apart (Compliant Surface) Head Motion - Eyes Open    With eyes open, standing on compliant surface: __pillow_, feet shoulder width apart, move head slowly: up and down x 10, then side to side x 10. Do __1-2__ sessions per day.  Copyright  VHI. All rights reserved.   Feet Together (Compliant Surface) Arm Motion - Eyes Closed    Stand on compliant surface: _pillow__ with feet together. Close eyes and hold 30 seconds.  Repeat __3__ times per session. Do __1-2__ sessions per day.  Copyright  VHI. All rights reserved.

## 2017-10-07 ENCOUNTER — Other Ambulatory Visit: Payer: Self-pay | Admitting: Family Medicine

## 2017-10-07 DIAGNOSIS — Z1231 Encounter for screening mammogram for malignant neoplasm of breast: Secondary | ICD-10-CM

## 2017-10-15 ENCOUNTER — Ambulatory Visit: Payer: POS | Admitting: Rehabilitative and Restorative Service Providers"

## 2017-10-15 ENCOUNTER — Encounter: Payer: Self-pay | Admitting: Rehabilitative and Restorative Service Providers"

## 2017-10-15 DIAGNOSIS — R42 Dizziness and giddiness: Secondary | ICD-10-CM | POA: Diagnosis not present

## 2017-10-15 DIAGNOSIS — R2681 Unsteadiness on feet: Secondary | ICD-10-CM

## 2017-10-15 NOTE — Therapy (Signed)
[facility] [address] [place], [place], [address] Phone: [phone]   Fax:  [phone]  Physical Therapy Treatment  Patient Details  Name: Lindsay Hayden MRN: [id number] Date of Birth: 30-Jul-1961 Referring Provider: Metta Clines, DO   Encounter Date: 10/15/2017  PT End of Session - 10/15/17 1302    Visit Number  3    Number of Visits  4    Date for PT Re-Evaluation  11/08/17    Authorization Type  private insurance    PT Start Time  1230    PT Stop Time  1300    PT Time Calculation (min)  30 min    Activity Tolerance  Patient tolerated treatment well    Behavior During Therapy  [facility] for tasks assessed/performed       Past Medical History:  Diagnosis Date  . Asthma   . Hyperlipidemia   . Hypertension   . OSA (obstructive sleep apnea)   . Prediabetes     Past Surgical History:  Procedure Laterality Date  . ACHILLES TENDON SURGERY     right  . CESAREAN SECTION     x2  . KNEE ARTHROSCOPY WITH MENISCAL REPAIR     right knee  . OVARIAN CYST REMOVAL     right    There were no vitals filed for this visit.  Subjective Assessment - 10/15/17 1237    Subjective  The patient reports no dizzinress with day to day activities.  She has had one episode of dizziness when performing quick head motion with HEP that resolved wtihin 5 minutes.   No headaches per report.    Pertinent History  HTN, hyperlipidemia, sleep apnea, "prediabetes" listed in history.    Patient Stated Goals  Stop the dizziness.     Currently in Pain?  No/denies             Vestibular Assessment - 10/15/17 1239      Vestibular Assessment   General Observation  No device walking independently into clinic.       Visual Acuity   Static  line 7    Dynamic  line 4 3 line difference, improved from 5 line              [facility] Adult PT Treatment/Exercise - 10/15/17 1307      Self-Care   Self-Care  Other Self-Care Comments    Other Self-Care Comments   The patient and PT discussed continuing HEP x 3 days/week x 1 month and then weening further down as long as she is doing well with daily activities.   Performed functional status score and patient scored 93%      Vestibular Treatment/Exercise - 10/15/17 1242      Vestibular Treatment/Exercise   Vestibular Treatment Provided  Gaze    Gaze Exercises  X1 Viewing Horizontal      Standing Horizontal Head Turns   Number of Reps   10    Symptom Description   on compliant surface without dizziness      Standing Vertical Head Turns   Number of Reps   10    Symptom Description   no dizziness; performed on compliant surface      X1 Viewing Horizontal   Foot Position  standing feet apart    Comments  30 seconds without dizziness between 1-2 MHz. (> 1 cycle/second)                 PT Long Term Goals - 10/15/17  [place] #1   Title  The patient will be indep with HEP for gaze adaptation and motion sensitivity.    Time  4    Period  Weeks    Status  Achieved      PT LONG TERM GOAL #2   Title  The patient will tolerate VOR x 1 gaze adaptation x 30 seconds nonstop with c/o dizziness, or other symptoms < or equal to 2/10.    Time  4    Period  Weeks    Status  Achieved      PT LONG TERM GOAL #3   Title  The patient will improve SVA vs DVA to < or equal to 3 lines to demo improving VOR.    Baseline  was a 5 line difference.     Time  4    Period  Weeks    Status  Achieved      PT LONG TERM GOAL #4   Title  The patient will tolerate head motion horizontal plane x 10 reps with dizziness < or equal to 2/10.    Baseline  "off balance" sensation described as dizziness 1/10    Time  4    Period  Weeks    Status  Achieved      PT LONG TERM GOAL #5   Title  The patient will improve functional status score from 71% to 85% to demo improving perception of functional mobility.    Baseline  93%    Time  4    Period  Weeks    Status   Achieved            Plan - 10/15/17 1304    Clinical Impression Statement  The patient has met all LTGs and reports return to brisk walking/ light jogging for exercise.  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  PT recommended she f/u with physician if these symptoms persist, however she has not had any episodes this week.      PT Treatment/Interventions  ADLs/Self Care Home Management;Balance training;Neuromuscular re-education;Patient/family education;Gait training;Vestibular;Therapeutic exercise;Therapeutic activities;Functional mobility training    PT Next Visit Plan  Discharge today    Consulted and Agree with Plan of Care  Patient       Patient will benefit from skilled therapeutic intervention in order to improve the following deficits and impairments:  Decreased activity tolerance, Dizziness, Decreased balance  Visit Diagnosis: No diagnosis found.     Problem List There are no active problems to display for this patient.   [place], [place] 10/15/2017, 1:08 PM  [facility] [address] [place], [place], [address] Phone: [phone]   Fax:  [phone]  Name: Lindsay Hayden MRN: [id number] Date of Birth: March 05, 1961

## 2017-10-15 NOTE — Patient Instructions (Signed)
FOR HOME EXERCISES:  Continue doing current home program 3x/week.    After 4 weeks, you can decrease to 1x/week as long as exercises continue to feel easy for you.  After doing them for a couple of weeks at 1x/week, you can stop doing them as long as you continue to move well during daily activities without dizziness.  [place], [place] [address] [place], [place]  [address] Phone:  [phone] Fax:  [phone]

## 2017-10-15 NOTE — Therapy (Signed)
[facility] [address] [place], [place], [address] Phone: [phone]   Fax:  [phone]  Patient Details  Name: Lindsay Hayden MRN: [id number] Date of Birth: December 12, 1960 Referring Provider:  Metta Clines, DO  PHYSICAL THERAPY DISCHARGE SUMMARY  Visits from Start of Care: 3  Current functional level related to goals / functional outcomes: PT Long Term Goals - 10/15/17 1238      PT LONG TERM GOAL #1   Title  The patient will be indep with HEP for gaze adaptation and motion sensitivity.    Time  4    Period  Weeks    Status  Achieved      PT LONG TERM GOAL #2   Title  The patient will tolerate VOR x 1 gaze adaptation x 30 seconds nonstop with c/o dizziness, or other symptoms < or equal to 2/10.    Time  4    Period  Weeks    Status  Achieved      PT LONG TERM GOAL #3   Title  The patient will improve SVA vs DVA to < or equal to 3 lines to demo improving VOR.    Baseline  was a 5 line difference.     Time  4    Period  Weeks    Status  Achieved      PT LONG TERM GOAL #4   Title  The patient will tolerate head motion horizontal plane x 10 reps with dizziness < or equal to 2/10.    Baseline  "off balance" sensation described as dizziness 1/10    Time  4    Period  Weeks    Status  Achieved      PT LONG TERM GOAL #5   Title  The patient will improve functional status score from 71% to 85% to demo improving perception of functional mobility.    Baseline  93%    Time  4    Period  Weeks    Status  Achieved         Remaining deficits: None at this time.   Education / Equipment: Home program, home walking/jogging.  Plan: Patient agrees to discharge.  Patient goals were met. Patient is being discharged due to meeting the stated rehab goals.  ?????     Thank you for the referral of this patient. Rudell Cobb, MPT   Encounter Date: 10/15/2017   Lindsay Hayden 10/15/2017, 1:10 PM  [facility] [address] [place], [place], [address] Phone: [phone]   Fax:  [phone]

## 2017-10-28 ENCOUNTER — Ambulatory Visit
Admission: RE | Admit: 2017-10-28 | Discharge: 2017-10-28 | Disposition: A | Discharge status: Home / Self Care | Payer: POS | Source: Ambulatory Visit | Attending: Family Medicine | Admitting: Family Medicine

## 2017-10-28 DIAGNOSIS — Z1231 Encounter for screening mammogram for malignant neoplasm of breast: Secondary | ICD-10-CM

## 2017-11-06 ENCOUNTER — Ambulatory Visit: Payer: 59

## 2017-11-13 ENCOUNTER — Ambulatory Visit: Payer: 59

## 2017-11-20 ENCOUNTER — Ambulatory Visit: Payer: 59

## 2017-11-27 ENCOUNTER — Encounter: Payer: POS | Attending: Family Medicine | Admitting: Dietician

## 2017-11-27 ENCOUNTER — Encounter: Payer: Self-pay | Admitting: Dietician

## 2017-11-27 DIAGNOSIS — Z713 Dietary counseling and surveillance: Secondary | ICD-10-CM | POA: Insufficient documentation

## 2017-11-27 DIAGNOSIS — D119 Benign neoplasm of major salivary gland, unspecified: Secondary | ICD-10-CM | POA: Diagnosis not present

## 2017-11-27 DIAGNOSIS — E119 Type 2 diabetes mellitus without complications: Secondary | ICD-10-CM

## 2017-11-27 NOTE — Progress Notes (Signed)
Patient was seen on 11/27/17 for the first of a series of three diabetes self-management courses at the [facility].  Patient Education Plan per assessed needs and concerns is to attend three course education program for Diabetes Self Management Education.  The following learning objectives were met by the patient during this class:  Describe diabetes  State some common risk factors for diabetes  Defines the role of glucose and insulin  Identifies type of diabetes and pathophysiology  Describe the relationship between diabetes and cardiovascular risk  State the members of the Healthcare Team  States the rationale for glucose monitoring  State when to test glucose  State their individual Target Range  State the importance of logging glucose readings  Describe how to interpret glucose readings  Identifies A1C target  Explain the correlation between A1c and eAG values  State symptoms and treatment of high blood glucose  State symptoms and treatment of low blood glucose  Explain proper technique for glucose testing  Identifies proper sharps disposal  Handouts given during class include:  Living Well with Diabetes book  Carb Counting and Meal Planning book  Meal Plan Card  Meal planning worksheet  Low Sodium Flavoring Tips  Types of Fats  The diabetes portion plate  L5V to eAG Conversion Chart  Diabetes Recommended Care Schedule  Support Group  Diabetes Success Plan  Core Class Satisfaction Survey   Follow-Up Plan:  Attend core 2

## 2017-12-04 ENCOUNTER — Encounter: Payer: POS | Admitting: Dietician

## 2017-12-04 DIAGNOSIS — E119 Type 2 diabetes mellitus without complications: Secondary | ICD-10-CM

## 2017-12-04 DIAGNOSIS — Z713 Dietary counseling and surveillance: Secondary | ICD-10-CM | POA: Diagnosis not present

## 2017-12-04 NOTE — Progress Notes (Signed)
Patient was seen on 12/04/17 for the second of a series of three diabetes self-management courses at the [facility]. The following learning objectives were met by the patient during this class:   Describe the role of different macronutrients on glucose  Explain how carbohydrates affect blood glucose  State what foods contain the most carbohydrates  Demonstrate carbohydrate counting  Demonstrate how to read Nutrition Facts food label  Describe effects of various fats on heart health  Describe the importance of good nutrition for health and healthy eating strategies  Describe techniques for managing your shopping, cooking and meal planning  List strategies to follow meal plan when dining out  Describe the effects of alcohol on glucose and how to use it safely  Goals:  Follow Diabetes Meal Plan as instructed  Aim to spread carbs evenly throughout the day  Aim for 3 meals per day and snacks as needed Include lean protein foods to meals/snacks  Monitor glucose levels as instructed by your doctor   Follow-Up Plan:  Attend Core 3  Work towards following your personal food plan.

## 2017-12-11 ENCOUNTER — Encounter: Payer: POS | Admitting: Dietician

## 2017-12-11 DIAGNOSIS — E119 Type 2 diabetes mellitus without complications: Secondary | ICD-10-CM

## 2017-12-11 DIAGNOSIS — Z713 Dietary counseling and surveillance: Secondary | ICD-10-CM | POA: Diagnosis not present

## 2017-12-11 NOTE — Progress Notes (Signed)
Patient was seen on 12/11/17 for the third of a series of three diabetes self-management courses at the [facility].   Catalina Gravel the amount of activity recommended for healthy living . Describe activities suitable for individual needs . Identify ways to regularly incorporate activity into daily life . Identify barriers to activity and ways to over come these barriers  Identify diabetes medications being personally used and their primary action for lowering glucose and possible side effects . Describe role of stress on blood glucose and develop strategies to address psychosocial issues . Identify diabetes complications and ways to prevent them  Explain how to manage diabetes during illness . Evaluate success in meeting personal goal . Establish 2-3 goals that they will plan to diligently work on  Goals:   I will count my carb choices at most meals and snacks  I will be active 30 minutes or more 5 times a week  To help manage stress I will  Do deep breathing at least 2 times a week  Your patient has identified these potential barriers to change:  Motivation  Your patient has identified their diabetes self-care support plan as  Family Support    Plan:  Attend Support Group as desired

## 2018-10-12 ENCOUNTER — Other Ambulatory Visit: Payer: Self-pay | Admitting: Family Medicine

## 2018-10-12 DIAGNOSIS — Z1231 Encounter for screening mammogram for malignant neoplasm of breast: Secondary | ICD-10-CM

## 2018-11-09 ENCOUNTER — Ambulatory Visit
Admission: RE | Admit: 2018-11-09 | Discharge: 2018-11-09 | Disposition: A | Discharge status: Home / Self Care | Payer: POS | Source: Ambulatory Visit | Attending: Family Medicine | Admitting: Family Medicine

## 2018-11-09 DIAGNOSIS — Z1231 Encounter for screening mammogram for malignant neoplasm of breast: Secondary | ICD-10-CM

## 2019-02-12 ENCOUNTER — Telehealth: Payer: Self-pay

## 2019-02-12 ENCOUNTER — Encounter: Payer: Self-pay | Admitting: Cardiology

## 2019-02-12 NOTE — Telephone Encounter (Signed)
Left message for patient to return phone call regarding upcoming virtual appointment with Dr Radford Pax. Need verbal consent for virtual visit.

## 2019-02-12 NOTE — Telephone Encounter (Signed)

## 2019-02-12 NOTE — Telephone Encounter (Signed)
New Message    Pt is returning call and says she is at work but will try to answer     Please call back

## 2019-02-12 NOTE — Telephone Encounter (Signed)
Duplicate

## 2019-02-15 ENCOUNTER — Other Ambulatory Visit: Payer: Self-pay

## 2019-02-15 ENCOUNTER — Telehealth (INDEPENDENT_AMBULATORY_CARE_PROVIDER_SITE_OTHER): Payer: POS | Admitting: Cardiology

## 2019-02-15 ENCOUNTER — Encounter: Payer: Self-pay | Admitting: Cardiology

## 2019-02-15 DIAGNOSIS — E669 Obesity, unspecified: Secondary | ICD-10-CM | POA: Diagnosis not present

## 2019-02-15 DIAGNOSIS — I10 Essential (primary) hypertension: Secondary | ICD-10-CM

## 2019-02-15 DIAGNOSIS — I420 Dilated cardiomyopathy: Secondary | ICD-10-CM | POA: Diagnosis not present

## 2019-02-15 DIAGNOSIS — G4733 Obstructive sleep apnea (adult) (pediatric): Secondary | ICD-10-CM

## 2019-02-15 DIAGNOSIS — I447 Left bundle-branch block, unspecified: Secondary | ICD-10-CM | POA: Diagnosis not present

## 2019-02-15 DIAGNOSIS — Z6835 Body mass index (BMI) 35.0-35.9, adult: Secondary | ICD-10-CM | POA: Insufficient documentation

## 2019-02-15 MED ORDER — SPIRONOLACTONE 25 MG PO TABS: 25.0000 mg | ORAL_TABLET | Freq: Every day | ORAL | 3 refills | Status: DC

## 2019-02-15 MED ORDER — FUROSEMIDE 20 MG PO TABS: 20.0000 mg | ORAL_TABLET | Freq: Every day | ORAL | 3 refills | Status: DC

## 2019-02-15 MED ORDER — METOPROLOL SUCCINATE ER 25 MG PO TB24: 25.0000 mg | ORAL_TABLET | Freq: Every day | ORAL | 3 refills | Status: DC

## 2019-02-15 MED ORDER — ENALAPRIL MALEATE 20 MG PO TABS: 20.0000 mg | ORAL_TABLET | Freq: Every day | ORAL | 3 refills | Status: DC

## 2019-02-15 NOTE — Progress Notes (Signed)
Virtual Visit via Video Note   This visit type was conducted due to national recommendations for restrictions regarding the COVID-19 Pandemic (e.g. social distancing) in an effort to limit this patient's exposure and mitigate transmission in our community.  Due to her co-morbid illnesses, this patient is at least at moderate risk for complications without adequate follow up.  This format is felt to be most appropriate for this patient at this time.  All issues noted in this document were discussed and addressed.  A limited physical exam was performed with this format.  Please refer to the patient's chart for her consent to telehealth for [facility].   Evaluation Performed:  Follow-up visit  This visit type was conducted due to national recommendations for restrictions regarding the COVID-19 Pandemic (e.g. social distancing).  This format is felt to be most appropriate for this patient at this time.  All issues noted in this document were discussed and addressed.  No physical exam was performed (except for noted visual exam findings with Video Visits).  Please refer to the patient's chart (MyChart message for video visits and phone note for telephone visits) for the patient's consent to telehealth for [facility].  Date:  02/15/2019   ID:  Lindsay Hayden, DOB 1961/07/25, MRN [id number]  Patient Location:  [facility]  Provider location:   [place]  PCP:  Kathyrn Lass, MD  Cardiologist:  NEW - old Dr. Wynonia Lawman patient Electrophysiologist:  None   Chief Complaint:  DCM, HTN, LBBB, oSA, lipids  History of Present Illness:    Lindsay Hayden is a 58 y.o. female who presents via audio/video conferencing for a telehealth visit today.    This is a 58 year old female with a history of hypertensive dilated cardiomyopathy, hypertension, left bundle branch block, obstructive sleep apnea, hyperlipidemia and obesity.  She was last seen by Dr. Wynonia Lawman in 2018.  She is now here to establish  care.  She is here today for followup and is doing well.  She denies any chest pain or pressure (unless it is windy and cold out which will trigger her asthma), PND, orthopnea, LE edema, dizziness, palpitations or syncope. She was diagnosed with asthma a few years ago which will cause wheezing and SOB at times.  She is compliant with her meds and is tolerating meds with no SE.    The patient does not have symptoms concerning for COVID-19 infection (fever, chills, cough, or new shortness of breath).   Prior CV studies:   The following studies were reviewed today:  none  Past Medical History:  Diagnosis Date  . Asthma   . Diabetes mellitus without complication ([place])   . Hyperlipidemia   . Hypertension   . OSA (obstructive sleep apnea)   . Prediabetes   . Sleep apnea    Past Surgical History:  Procedure Laterality Date  . ACHILLES TENDON SURGERY     right  . CESAREAN SECTION     x2  . KNEE ARTHROSCOPY WITH MENISCAL REPAIR     right knee  . OVARIAN CYST REMOVAL     right     Current Meds  Medication Sig  . albuterol (PROVENTIL HFA;VENTOLIN HFA) 108 (90 Base) MCG/ACT inhaler Inhale into the lungs every 6 (six) hours as needed for wheezing or shortness of breath.  Marland Kitchen atorvastatin (LIPITOR) 10 MG tablet Take 10 mg by mouth daily.  . cholecalciferol (VITAMIN D) 1000 units tablet Take 5,000 Units by mouth daily.  . enalapril (VASOTEC) 20 MG tablet Take 20  mg by mouth daily.  . furosemide (LASIX) 20 MG tablet Take 20 mg by mouth daily.   . hydrALAZINE (APRESOLINE) 25 MG tablet Take 25 mg by mouth daily.   Marland Kitchen LEVOXYL 88 MCG tablet Take 1 tablet by mouth daily.  . meclizine (ANTIVERT) 25 MG tablet Take 1 tablet (25 mg total) by mouth 3 (three) times daily as needed for dizziness.  . metoprolol succinate (TOPROL-XL) 25 MG 24 hr tablet Take 25 mg by mouth daily.  Marland Kitchen spironolactone (ALDACTONE) 25 MG tablet Take 25 mg by mouth daily.  . [DISCONTINUED] levothyroxine (SYNTHROID, LEVOTHROID) 75  MCG tablet Take 75 mcg by mouth daily before breakfast.     Allergies:   Patient has no known allergies.   Social History   Tobacco Use  . Smoking status: Never Smoker  . Smokeless tobacco: Never Used  Substance Use Topics  . Alcohol use: Yes    Alcohol/week: 1.0 standard drinks    Types: 1 Glasses of wine per week    Comment: 1-2 times a month  . Drug use: No     Family Hx: The patient's family history is not on file.  ROS:   Please see the history of present illness.     All other systems reviewed and are negative.   Labs/Other Tests and Data Reviewed:    Recent Labs: No results found for requested labs within last 8760 hours.   Recent Lipid Panel No results found for: CHOL, TRIG, HDL, CHOLHDL, LDLCALC, LDLDIRECT  Wt Readings from Last 3 Encounters:  02/15/19 186 lb (84.4 kg)  06/13/17 195 lb 3.2 oz (88.5 kg)  06/09/17 192 lb (87.1 kg)     Objective:    Vital Signs:  BP 118/65 (BP Location: Right Arm, Patient Position: Sitting, Cuff Size: Normal)   Pulse 68   Ht 5\' 3"  (1.6 m)   Wt 186 lb (84.4 kg)   BMI 32.95 kg/m    CONSTITUTIONAL:  Well nourished, well developed female in no acute distress.  EYES: anicteric MOUTH: oral mucosa is pink RESPIRATORY: Normal respiratory effort, symmetric expansion CARDIOVASCULAR: No peripheral edema SKIN: No rash, lesions or ulcers MUSCULOSKELETAL: no digital cyanosis NEURO: Cranial Nerves II-XII grossly intact, moves all extremities PSYCH: Intact judgement and insight.  A&O x 3, Mood/affect appropriate   ASSESSMENT & PLAN:    1.  DCM - presumed to be hypertensive in origin.  Her last 2D echocardiogram was 04/04/2018 showing moderate LVH with mild left atrial enlargement and EF 35%.  She denies any heart failure symptoms and is NYHA class I CHF.  She will continue on Vasotec 20 mg daily, Lasix 20 mg daily, Toprol-XL 25 mg daily and spironolactone 25 mg daily.   She can only take the hydralazine once daily because she can  hear her heart beat at night if she takes her nightly does and then she cannot sleep.  Since she can only take it once daily I suspect that it is not really adding any benefit.  Her BP is well controlled.  I have told her to stop the hydralazine and check her BP twice daily for a week and call with the results.  I will repeat a 2D echo and if EF remains < 40% then stop ACE I and start Entresto and titrate to highest dose tolerated and repeat echo.    2.  Hypertension - blood pressure is well controlled on exam today. She will continue on Vasotec for now as well as BB.  Stop  hydralazine.    3.  Chronic left bundle branch block - I will try to get old records from Dr. Thurman Coyer office.  She tells me that her Cardiologist in [place] did a stress test once the LBBB and DCM were dx and stress test was fine.  She has had 2 heart caths done in [place] that were normal.    4.  Obstructive sleep apnea - she is on CPAP.  I will get a download from her DME.    5.  Obesity - I have encouraged her to get into a routine exercise program and cut back on carbs and portions.   6.  COVID-19 Education:The signs and symptoms of COVID-19 were discussed with the patient and how to seek care for testing (follow up with PCP or arrange E-visit).  The importance of social distancing was discussed today.  Patient Risk:   After full review of this patient's clinical status, I feel that they are at least moderate risk at this time.  Time:   Today, I have spent 20 minutes directly with the patient on video discussing medical problems including DCM, HTN, OSA, LBBB.  We also reviewed the symptoms of COVID 19 and the ways to protect against contracting the virus with telehealth technology.  I spent an additional 5 minutes reviewing patient's chart including office notes form Dr. Wynonia Lawman and 2D echo.  Medication Adjustments/Labs and Tests Ordered: Current medicines are reviewed at length with the patient today.  Concerns regarding medicines  are outlined above.  Tests Ordered: No orders of the defined types were placed in this encounter.  Medication Changes: No orders of the defined types were placed in this encounter.   Disposition:  Follow up in 6 month(s)  Signed, Fransico Him, MD  02/15/2019 10:29 AM    [facility]

## 2019-02-15 NOTE — Patient Instructions (Signed)
Medication Instructions:  Stop: Hydralazine   If you need a refill on your cardiac medications before your next appointment, please call your pharmacy.   Lab work: None If you have labs (blood work) drawn today and your tests are completely normal, you will receive your results only by: Marland Kitchen MyChart Message (if you have MyChart) OR . A paper copy in the mail If you have any lab test that is abnormal or we need to change your treatment, we will call you to review the results.  Testing/Procedures: Your physician has requested that you have an echocardiogram. Echocardiography is a painless test that uses sound waves to create images of your heart. It provides your doctor with information about the size and shape of your heart and how well your heart's chambers and valves are working. This procedure takes approximately one hour. There are no restrictions for this procedure.  Follow-Up: At [facility], you and your health needs are our priority.  As part of our continuing mission to provide you with exceptional heart care, we have created designated Provider Care Teams.  These Care Teams include your primary Cardiologist (physician) and Advanced Practice Providers (APPs -  Physician Assistants and Nurse Practitioners) who all work together to provide you with the care you need, when you need it. You will need a follow up appointment in 6 months.  Please call our office 2 months in advance to schedule this appointment.  You may see Dr. Radford Pax or one of the following Advanced Practice Providers on your designated Care Team:   [place], PA-C Melina Copa, PA-C . Ermalinda Barrios, PA-C

## 2019-02-16 ENCOUNTER — Telehealth: Payer: Self-pay | Admitting: *Deleted

## 2019-02-16 DIAGNOSIS — G4733 Obstructive sleep apnea (adult) (pediatric): Secondary | ICD-10-CM

## 2019-02-16 NOTE — Telephone Encounter (Signed)
Informed patient of compliance results and verbalized understanding was indicated. Patient is aware and agreeable to AHI being within range at 0.6/hr. Patient is aware and agreeable to being in compliance with machine usage Patient is aware and agreeable to no change in current pressures.  Patient states she needs filters I will send the order to [facility] via community message today.

## 2019-02-16 NOTE — Telephone Encounter (Signed)
-----   Message from Sueanne Margarita, MD sent at 02/16/2019  1:34 PM EDT ----- Regarding: RE: download I have reviewed her CPAP download.  She is 97% compliant in using more than 4 hours nightly.  Her AHI is 0.6/h and she is on auto CPAP.  She will continue on current settings.  Fransico Him ----- Message ----- From: Freada Bergeron, CMA Sent: 02/15/2019   1:37 PM EDT To: Sueanne Margarita, MD, [name], RN Subject: download                                       Patient in air view ----- Message ----- From: [name], RN Sent: 02/15/2019  12:11 PM EDT To: Freada Bergeron, CMA  Get D/L.  Suezanne Jacquet

## 2019-02-24 ENCOUNTER — Telehealth: Payer: Self-pay | Admitting: Cardiology

## 2019-02-24 NOTE — Telephone Encounter (Signed)
New Message     Pt is calling to leave her BP readings   06/03  Morning 120/68  Evening 115/55 06/04  Morning 123/74  Evening  111/51 06/05  Morning 122/59  Evening 111/57 06/06  Morning 122/64  Evening 110/60 06/07  Morning 122/60  Evening 113/42 06/08  Morning 110/65  Evening 104/65  06/09  Morning 123/70  Evening  126/65

## 2019-02-24 NOTE — Telephone Encounter (Signed)
BPs are great off of hydralazine

## 2019-02-25 NOTE — Telephone Encounter (Signed)
Left a detailed message, per dpr, to call back if the patient had any questions.

## 2019-03-21 IMAGING — MR MR HEAD W/O CM
10 of 11 series · 34 of 48 positions shown · non-contrast
Comparison: Prior CT from 06/09/2017.

CLINICAL DATA: Initial evaluation for vertigo, left-sided
headaches. Left-sided weakness with visual difficulties.

EXAM:
MRI HEAD WITHOUT CONTRAST
MRA HEAD WITHOUT CONTRAST
TECHNIQUE: Multiplanar, multiecho pulse sequences of the brain and surrounding
structures were obtained without intravenous contrast. Angiographic
images of the head were obtained using MRA technique without
contrast.

[Series 5: T1 · sagittal · 4.0mm · 0.75mm/px · 1 of 27 slices shown (1 of 2)]
[im 1/27]
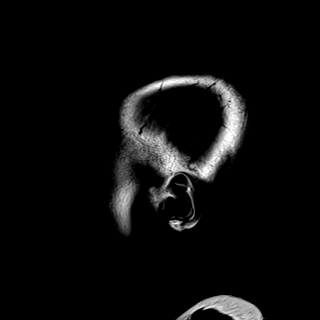

[Series 6: DWI · axial · 3.0mm · 1.44mm/px · z∈[-46,+95]mm · 6 of 88 slices shown (1 of 4)]
[im 1/88]
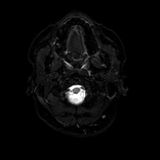
[im 18/88]
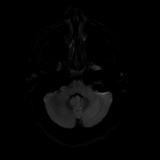
[im 35/88]
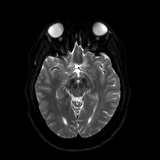
[im 53/88]
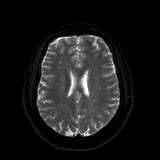
[im 70/88]
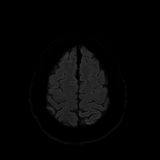
[im 88/88]
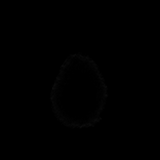

[Series 7: DWI · axial · 3.0mm · 1.44mm/px · z∈[-46,+95]mm · 3 of 44 slices shown (2 of 4)]
[im 1/44]
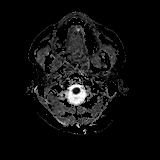
[im 22/44]
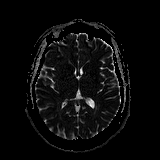
[im 44/44]
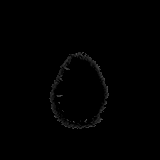

[Series 8: DWI · coronal · 5.0mm · 1.44mm/px · 4 of 60 slices shown (3 of 4)]
[im 1/60]
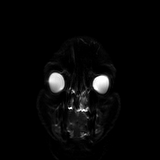
[im 20/60]
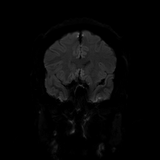
[im 40/60]
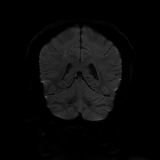
[im 60/60]
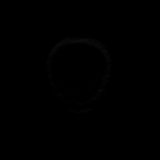

[Series 9: DWI · coronal · 5.0mm · 1.44mm/px · 2 of 30 slices shown (4 of 4)]
[im 1/30]
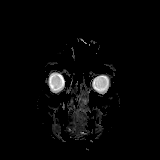
[im 30/30]
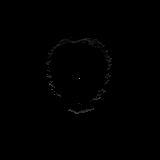

[Series 10: T2 · axial · 4.0mm · 0.36mm/px · z∈[-49,+91]mm · 2 of 28 slices shown (1 of 2)]
[im 1/28]
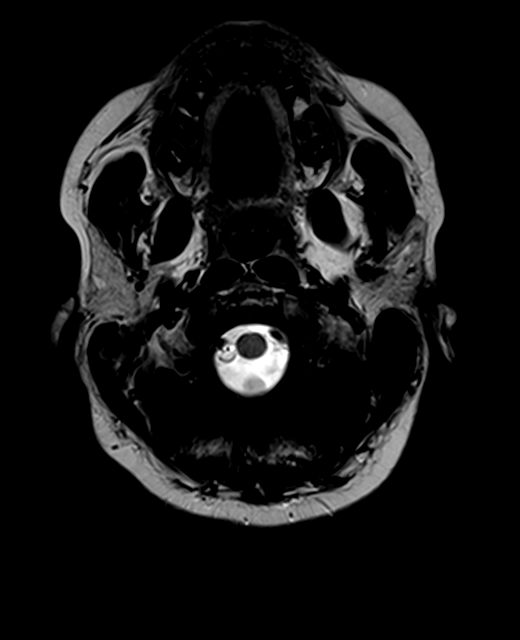
[im 28/28]
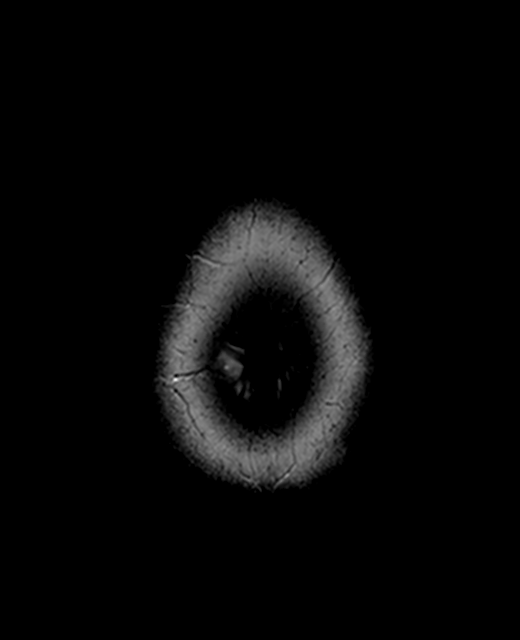

[Series 11: FLAIR · axial · 3.0mm · 0.72mm/px · z∈[-52,+97]mm · 2 of 26 slices shown]
[im 1/26]
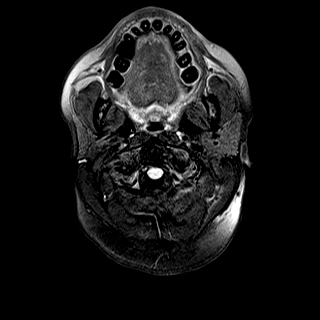
[im 26/26]
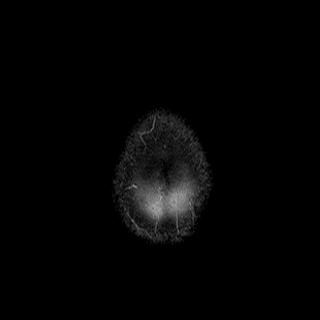

[Series 15: tof_[id number]_multi-slab · axial · 0.6mm · 0.26mm/px · z∈[-42,-3]mm · 4 of 162 slices shown]
[im 1/162]
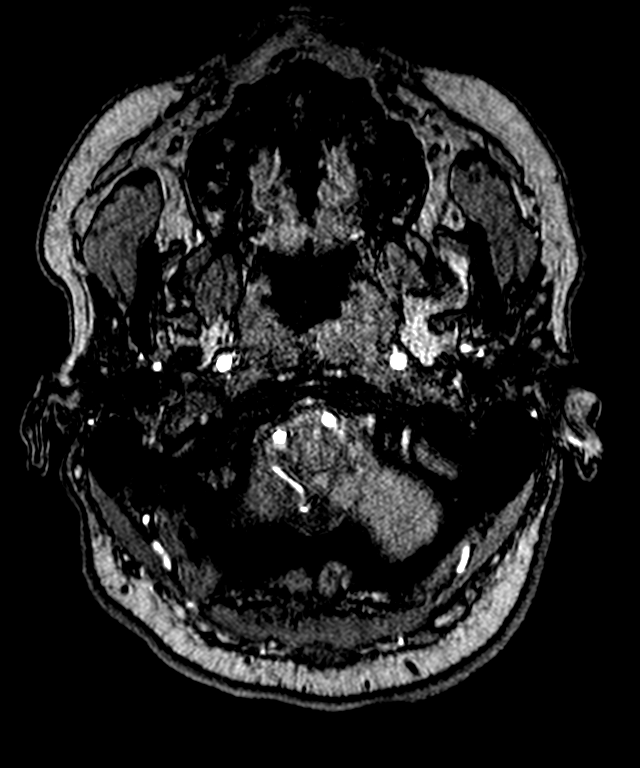
[im 33/162]
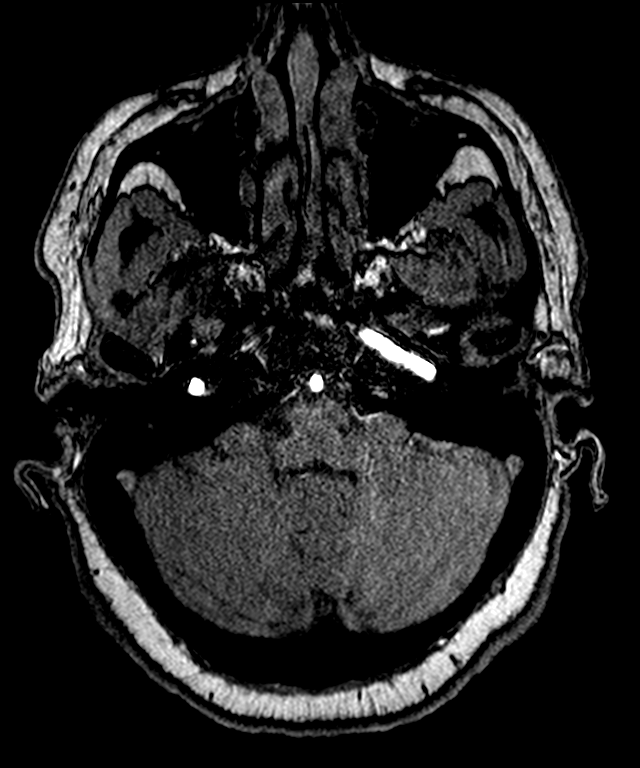
[im 49/162]
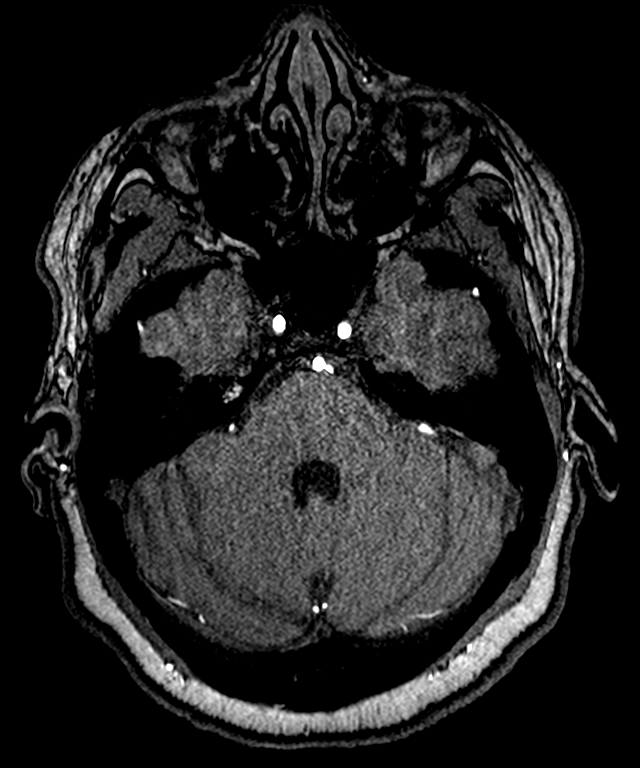
[im 65/162]
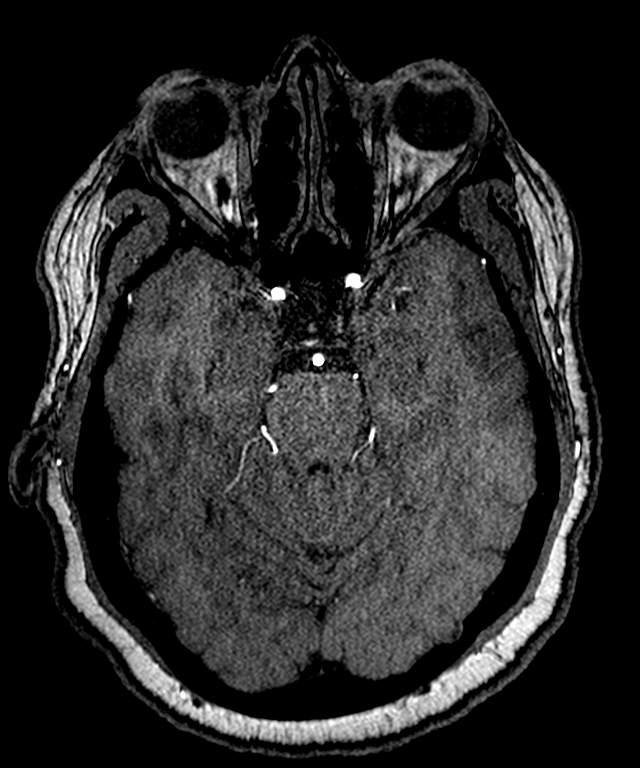

[Series 22: T1 · axial · 1.0mm · 0.90mm/px · z∈[-51,+92]mm · 8 of 144 slices shown (2 of 2)]
[im 1/144]
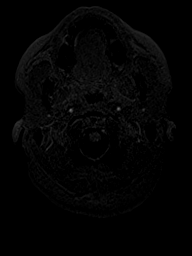
[im 18/144]
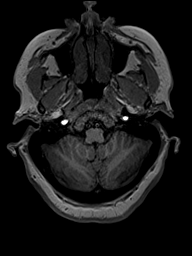
[im 36/144]
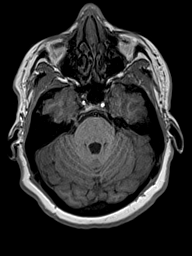
[im 54/144]
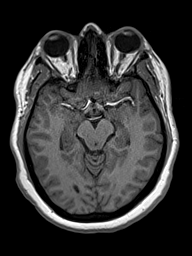
[im 90/144]
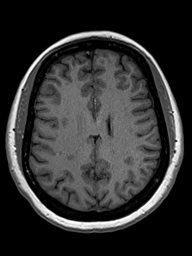
[im 108/144]
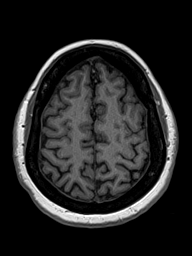
[im 126/144]
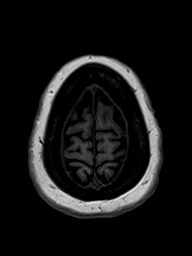
[im 144/144]
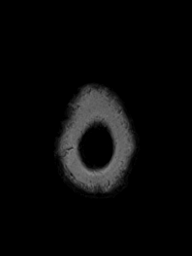

[Series 23: T2 · coronal · 4.5mm · 0.36mm/px · 2 of 30 slices shown (2 of 2)]
[im 1/30]
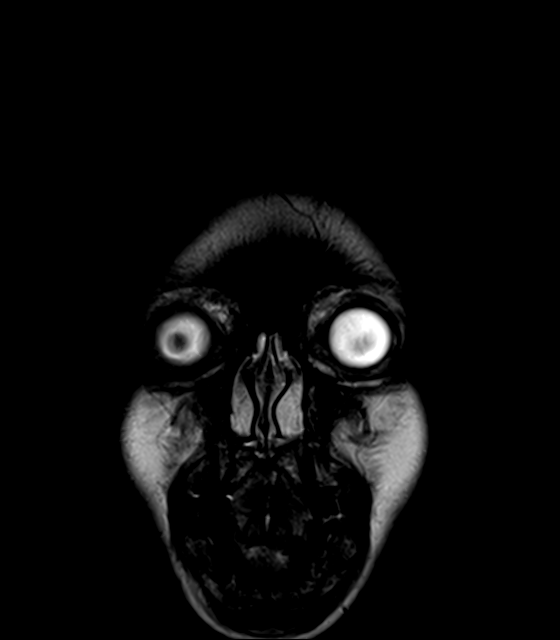
[im 30/30]
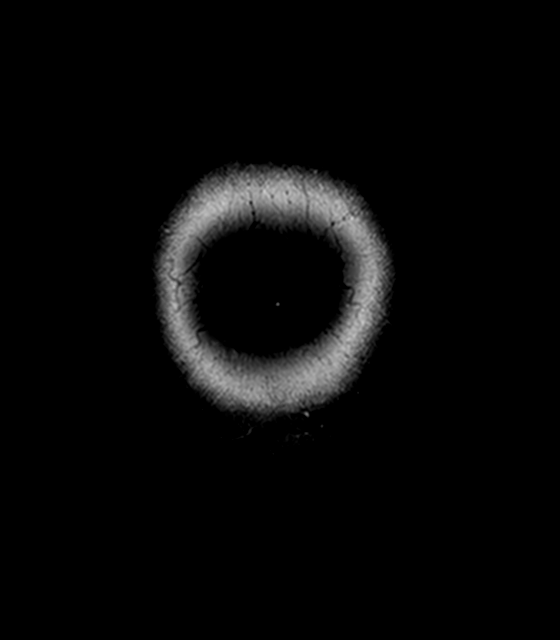

[34 of 48 positions shown; findings below may reference images not displayed]

FINDINGS: MRI HEAD FINDINGS

Brain: Cerebral volume within normal limits for age. Patchy
multifocal T2/FLAIR hyperintense foci seen involving the
periventricular and deep white matter both cerebral hemispheres,
nonspecific. Overall, these are mild to moderate for age.

No abnormal foci of restricted diffusion to suggest acute or
subacute ischemia. Gray-white matter differentiation maintained. No
encephalomalacia to suggest chronic infarction. No foci of
susceptibility artifact to suggest acute or chronic intracranial
hemorrhage.

No mass lesion, midline shift or mass effect. No hydrocephalus. No
extra-axial fluid collection. Major dural sinuses are grossly
patent.

Note made of an empty sella.  Midline structures intact and normal.

Vascular: Major intracranial vascular flow voids are maintained.

Skull and upper cervical spine: Craniocervical junction normal.
Upper cervical spine normal. Bone marrow signal intensity somewhat
diffusely decreased on T1 weighted imaging, most commonly related to
anemia, smoking, or obesity. No discrete osseous lesions. Scalp soft
tissues within normal limits.

Sinuses/Orbits: Globes and orbital soft tissues are normal.
Paranasal sinuses are clear. No mastoid effusion. Inner ear
structures normal.

Other: None.

MRA HEAD FINDINGS

ANTERIOR CIRCULATION:

Distal cervical segments of the internal carotid arteries are patent
with antegrade flow. Petrous, cavernous, and supraclinoid segments
patent without flow-limiting stenosis. ICA termini pain. A1 segments
patent bilaterally. Normal anterior communicating artery. Anterior
cerebral arteries widely patent to their distal aspects. M1 segments
widely patent without stenosis or occlusion. Normal MCA
bifurcations. No proximal M2 occlusion or stenosis. Distal MCA
branches well perfused and symmetric.

POSTERIOR CIRCULATION:

Vertebral arteries widely patent to the vertebrobasilar junction.
Basilar artery widely patent to its distal aspect. Superior cerebral
arteries patent bilaterally. Both of the posterior cerebral arteries
primarily supplied via the basilar artery and are widely patent to
their distal aspects. Small bilateral posterior communicating
arteries noted.

No aneurysm or vascular malformation.
IMPRESSION: MRI HEAD IMPRESSION:

1. No acute or reversible intracranial process identified.
2. Patchy nonspecific cerebral white matter disease,
mild-to-moderate for patient age. Primary differential
considerations include sequelae of chronic small vessel ischemic
disease, changes related of migrainous disorder, vasculitis, or
sequelae of prior infectious or inflammatory process. While not
typical for underlying demyelinating disease, this could be
considered in the correct clinical setting.
3. Empty sella. While this is often a finding of no clinical
significance, this can also be seen in the setting of elevated
intracranial pressures.

MRA HEAD IMPRESSION:

Normal intracranial MRA.

## 2019-03-22 ENCOUNTER — Other Ambulatory Visit: Payer: Self-pay

## 2019-03-22 ENCOUNTER — Ambulatory Visit (HOSPITAL_COMMUNITY): Payer: POS | Attending: Internal Medicine

## 2019-03-22 DIAGNOSIS — I420 Dilated cardiomyopathy: Secondary | ICD-10-CM | POA: Diagnosis not present

## 2019-03-23 ENCOUNTER — Telehealth: Payer: Self-pay

## 2019-03-23 DIAGNOSIS — I10 Essential (primary) hypertension: Secondary | ICD-10-CM

## 2019-03-23 MED ORDER — SACUBITRIL-VALSARTAN 24-26 MG PO TABS: 1.0000 | ORAL_TABLET | Freq: Two times a day (BID) | ORAL | 11 refills | Status: DC

## 2019-03-23 NOTE — Telephone Encounter (Signed)
Notes recorded by Sueanne Margarita, MD on 03/23/2019 at 4:31 PM EDT  Since EF remains < 40% please have patient stop Vasotec and start Entresto 24-26mg  BID 48 hours after her last dose of Vasotec. Followup with PA in 2 weeks for uptitration of Entresto. BMET at time of OV with PA.

## 2019-03-23 NOTE — Telephone Encounter (Signed)
Spoke with the patient, she expressed understanding about stopping Vasotec and starting entresto on 03/26/19. She is scheduled for follow up with the PA and is getting labs the same day.

## 2019-03-23 NOTE — Telephone Encounter (Signed)
-----   Message from Sueanne Margarita, MD sent at 03/23/2019  4:28 PM EDT ----- Please let patient know that echo showed moderately reduced LVF with EF 35-40%, moderately thickened heart muscle, increased stiffness of heart muscle.  Compared to prior echo, EF remains the same.

## 2019-03-24 NOTE — Telephone Encounter (Signed)
Called in the prescription to the pharmacy.

## 2019-03-24 NOTE — Telephone Encounter (Signed)
Spoke with the patient, she expressed understanding about the 10 dollar co-pay card.

## 2019-04-09 ENCOUNTER — Telehealth: Payer: Self-pay

## 2019-04-09 ENCOUNTER — Ambulatory Visit: Payer: POS | Admitting: Physician Assistant

## 2019-04-09 ENCOUNTER — Other Ambulatory Visit: Payer: POS

## 2019-04-09 NOTE — Telephone Encounter (Signed)
Spoke with pt and she answered no to all prescreening questions and is aware of new in office protocol.        COVID-19 Pre-Screening Questions:  . In the past 7 to 10 days have you had a cough,  shortness of breath, headache, congestion, fever (100 or greater) body aches, chills, sore throat, or sudden loss of taste or sense of smell? NO . Have you been around anyone with known Covid 19? NO . Have you been around anyone who is awaiting Covid 19 test results in the past 7 to 10 days? NO . Have you been around anyone who has been exposed to Covid 19, or has mentioned symptoms of Covid 19 within the past 7 to 10 days? NO  If you have any concerns/questions about symptoms patients report during screening (either on the phone or at threshold). Contact the provider seeing the patient or DOD for further guidance.  If neither are available contact a member of the leadership team.

## 2019-04-12 ENCOUNTER — Ambulatory Visit: Payer: POS | Admitting: Cardiology

## 2019-04-12 ENCOUNTER — Encounter: Payer: Self-pay | Admitting: Cardiology

## 2019-04-12 ENCOUNTER — Other Ambulatory Visit: Payer: Self-pay

## 2019-04-12 ENCOUNTER — Other Ambulatory Visit: Payer: POS

## 2019-04-12 VITALS — BP 130/80 | HR 59 | Ht 63.0 in | Wt 187.8 lb

## 2019-04-12 DIAGNOSIS — I10 Essential (primary) hypertension: Secondary | ICD-10-CM | POA: Diagnosis not present

## 2019-04-12 DIAGNOSIS — I447 Left bundle-branch block, unspecified: Secondary | ICD-10-CM

## 2019-04-12 DIAGNOSIS — I5022 Chronic systolic (congestive) heart failure: Secondary | ICD-10-CM | POA: Diagnosis not present

## 2019-04-12 NOTE — Progress Notes (Signed)
04/12/2019 Lindsay Hayden   03-14-1961  [id number]  Primary Physician Kathyrn Lass, MD Primary Cardiologist: Dr. Radford Pax  Electrophysiologist: None   Reason for Visit/CC: f/u for chronic systolic HF  HPI:  This is a 58 year old female with a history of hypertensive dilated cardiomyopathy, hypertension, left bundle branch block, obstructive sleep apnea, hyperlipidemia and obesity. She was previously followed by Dr. Wynonia Lawman and recently established care with Dr. Radford Pax. She is here today for f/u for her chronic systolic HF/ medication management.   Dr. Radford Pax recently ordered a 2D echo to reassess LVEF. Echo 7/6 showed moderately reduced LVEF at 35-40%. Decision was made to stop her ACEi and after 36 hr washout, she was started on Entresto. She was also previously on hydralazine but did not tolerate BID dosing due to side effect (tinnitus). This was discontinued at last OV. She is on Toprol XL, spironolactone and Lasix.   She is back today for f/u after recent med changes. Also needs f/u BMP after starting Entresto. She is tolerating medication ok. She had some trouble sleeping when she first started it, but seems to think this was related to changes in her work schedule/ work hours. This is getting better. No fatigue, dyspnea or CP. No LEE, orthopnea or PND. BP is stable. 130/80 today.     Cardiac Studies  2D Echo 03/22/19  IMPRESSIONS    1. The left ventricle has moderately reduced systolic function, with an ejection fraction of 35-40%. The cavity size was normal. There is moderately increased left ventricular wall thickness. Left ventricular diastolic Doppler parameters are consistent  with pseudonormalization. Elevated left ventricular end-diastolic pressure The E/e' is 17.7. There is abnormal septal motion consistent with left bundle branch block.  2. The right ventricle has normal systolic function. The cavity was normal. There is no increase in right ventricular wall thickness.  Right ventricular systolic pressure is normal with an estimated pressure of 19.5 mmHg.  3. The aortic valve is tricuspid. No stenosis of the aortic valve. Current Meds  Medication Sig   albuterol (PROVENTIL HFA;VENTOLIN HFA) 108 (90 Base) MCG/ACT inhaler Inhale into the lungs every 6 (six) hours as needed for wheezing or shortness of breath.   atorvastatin (LIPITOR) 10 MG tablet Take 10 mg by mouth daily.   cholecalciferol (VITAMIN D) 1000 units tablet Take 5,000 Units by mouth daily.   furosemide (LASIX) 20 MG tablet Take 1 tablet (20 mg total) by mouth daily.   LEVOXYL 88 MCG tablet Take 1 tablet by mouth daily.   meclizine (ANTIVERT) 25 MG tablet Take 1 tablet (25 mg total) by mouth 3 (three) times daily as needed for dizziness.   metoprolol succinate (TOPROL-XL) 25 MG 24 hr tablet Take 1 tablet (25 mg total) by mouth daily.   sacubitril-valsartan (ENTRESTO) 24-26 MG Take 1 tablet by mouth 2 (two) times daily.   spironolactone (ALDACTONE) 25 MG tablet Take 1 tablet (25 mg total) by mouth daily.   tiZANidine (ZANAFLEX) 4 MG tablet Take 4 mg by mouth as needed. As needed for muscle spasms   No Known Allergies Past Medical History:  Diagnosis Date   Asthma    Diabetes mellitus without complication ([place])    Hyperlipidemia    Hypertension    OSA (obstructive sleep apnea)    Prediabetes    Sleep apnea    No family history on file. Past Surgical History:  Procedure Laterality Date   ACHILLES TENDON SURGERY     right   CESAREAN SECTION  x2   KNEE ARTHROSCOPY WITH MENISCAL REPAIR     right knee   OVARIAN CYST REMOVAL     right   Social History   Socioeconomic History   Marital status: Single    Spouse name: Not on file   Number of children: 2   Years of education: AD   Highest education level: Not on file  Occupational History   Occupation: Scientific laboratory technician: [employer] resource strain: Not on file   Food  insecurity    Worry: Not on file    Inability: Not on file   Transportation needs    Medical: Not on file    Non-medical: Not on file  Tobacco Use   Smoking status: Never Smoker   Smokeless tobacco: Never Used  Substance and Sexual Activity   Alcohol use: Yes    Alcohol/week: 1.0 standard drinks    Types: 1 Glasses of wine per week    Comment: 1-2 times a month   Drug use: No   Sexual activity: Not on file  Lifestyle   Physical activity    Days per week: Not on file    Minutes per session: Not on file   Stress: Not on file  Relationships   Social connections    Talks on phone: Not on file    Gets together: Not on file    Attends religious service: Not on file    Active member of club or organization: Not on file    Attends meetings of clubs or organizations: Not on file    Relationship status: Not on file   Intimate partner violence    Fear of current or ex partner: Not on file    Emotionally abused: Not on file    Physically abused: Not on file    Forced sexual activity: Not on file  Other Topics Concern   Not on file  Social History Narrative   Patient is single, lives with boyfriend in a single story home, though does have a full basement that she rarely goes down the stairs to. No pets.     Lipid Panel  No results found for: CHOL, TRIG, HDL, CHOLHDL, VLDL, LDLCALC, LDLDIRECT  Review of Systems: General: negative for chills, fever, night sweats or weight changes.  Cardiovascular: negative for chest pain, dyspnea on exertion, edema, orthopnea, palpitations, paroxysmal nocturnal dyspnea or shortness of breath Dermatological: negative for rash Respiratory: negative for cough or wheezing Urologic: negative for hematuria Abdominal: negative for nausea, vomiting, diarrhea, bright red blood per rectum, melena, or hematemesis Neurologic: negative for visual changes, syncope, or dizziness All other systems reviewed and are otherwise negative except as noted  above.   Physical Exam:  Blood pressure 130/80, pulse (!) 59, height 5\' 3"  (1.6 m), weight 187 lb 12.8 oz (85.2 kg), SpO2 96 %.  General appearance: alert, cooperative and no distress Neck: no carotid bruit and no JVD Lungs: clear to auscultation bilaterally Heart: regular rate and rhythm, S1, S2 normal, no murmur, click, rub or gallop Extremities: extremities normal, atraumatic, no cyanosis or edema Pulses: 2+ and symmetric Skin: Skin color, texture, turgor normal. No rashes or lesions Neurologic: Grossly normal  EKG sinus bradycardia 59 bpm. Chronic LBBB -- personally reviewed   ASSESSMENT AND PLAN:   1. Chronic Systolic HF/ Hypertensive Dilated CM: recent echo showed moderately reduced LVEF, 36-40%, c/w prior study. Entresto recently started. BP stable, 130/80. Will obtain f/u BMP today. If renal function  and K WNL, will further titrate dose and have her f/u in HTN clinic in 2-3 weeks for further adjustment, if able to tolerate. Continue Toprol XL, spironolactone and lasix. We discussed recommendations for the management of chronic heart failure to control volume/symptoms and reduce risk of acute exacerbation that may necessitate hospitalization.  These measures include continuation of current diuretics with close outpatient monitoring of volume status through daily weights.  Patient advised to check weight daily and to call our office if greater than 3 pound weight gain in 24 hours or greater than 5 pound weight gain in the course of 1 week.  Patient also strongly encouraged to adhere to a low salt diet, reducing intake to less than 2 g daily.  2. HTN: controlled on current regimen. As noted above, will further titrate Entresto, depending on f/u BMP. If dose is adjusted, will plan f/u in HTN clinic in 2-3 weeks.   3. LBBB: chronic  4. OSA: on CPAP.    Follow-Up: f/u with Dr. Radford Pax in 3 months   Rilyn Upshaw Ladoris Gene, MHS [facility] 04/12/2019 1:39 PM

## 2019-04-12 NOTE — Patient Instructions (Signed)
Medication Instructions:  none If you need a refill on your cardiac medications before your next appointment, please call your pharmacy.   Lab work: none If you have labs (blood work) drawn today and your tests are completely normal, you will receive your results only by: Marland Kitchen MyChart Message (if you have MyChart) OR . A paper copy in the mail If you have any lab test that is abnormal or we need to change your treatment, we will call you to review the results.  Testing/Procedures: none  Follow-Up: At [facility], you and your health needs are our priority.  As part of our continuing mission to provide you with exceptional heart care, we have created designated Provider Care Teams.  These Care Teams include your primary Cardiologist (physician) and Advanced Practice Providers (APPs -  Physician Assistants and Nurse Practitioners) who all work together to provide you with the care you need, when you need it. .   Any Other Special Instructions Will Be Listed Below (If Applicable).  WEIGHT:  Weigh yourself daily at the same time.  If you gain more than 3 lbs in 1 DAY or 5 lbs in 1 WEEK, call our office [phone]   DASH Eating Plan DASH stands for "Dietary Approaches to Stop Hypertension." The DASH eating plan is a healthy eating plan that has been shown to reduce high blood pressure (hypertension). It may also reduce your risk for type 2 diabetes, heart disease, and stroke. The DASH eating plan may also help with weight loss. What are tips for following this plan?  General guidelines  Avoid eating more than 2,300 mg (milligrams) of salt (sodium) a day. If you have hypertension, you may need to reduce your sodium intake to 1,500 mg a day.  Limit alcohol intake to no more than 1 drink a day for nonpregnant women and 2 drinks a day for men. One drink equals 12 oz of beer, 5 oz of wine, or 1 oz of hard liquor.  Work with your health care provider to maintain a healthy body weight or to  lose weight. Ask what an ideal weight is for you.  Get at least 30 minutes of exercise that causes your heart to beat faster (aerobic exercise) most days of the week. Activities may include walking, swimming, or biking.  Work with your health care provider or diet and nutrition specialist (dietitian) to adjust your eating plan to your individual calorie needs. Reading food labels   Check food labels for the amount of sodium per serving. Choose foods with less than 5 percent of the Daily Value of sodium. Generally, foods with less than 300 mg of sodium per serving fit into this eating plan.  To find whole grains, look for the word "whole" as the first word in the ingredient list. Shopping  Buy products labeled as "low-sodium" or "no salt added."  Buy fresh foods. Avoid canned foods and premade or frozen meals. Cooking  Avoid adding salt when cooking. Use salt-free seasonings or herbs instead of table salt or sea salt. Check with your health care provider or pharmacist before using salt substitutes.  Do not fry foods. Cook foods using healthy methods such as baking, boiling, grilling, and broiling instead.  Cook with heart-healthy oils, such as olive, canola, soybean, or sunflower oil. Meal planning  Eat a balanced diet that includes: ? 5 or more servings of fruits and vegetables each day. At each meal, try to fill half of your plate with fruits and vegetables. ? Up  to 6-8 servings of whole grains each day. ? Less than 6 oz of lean meat, poultry, or fish each day. A 3-oz serving of meat is about the same size as a deck of cards. One egg equals 1 oz. ? 2 servings of low-fat dairy each day. ? A serving of nuts, seeds, or beans 5 times each week. ? Heart-healthy fats. Healthy fats called Omega-3 fatty acids are found in foods such as flaxseeds and coldwater fish, like sardines, salmon, and mackerel.  Limit how much you eat of the following: ? Canned or prepackaged foods. ? Food that is  high in trans fat, such as fried foods. ? Food that is high in saturated fat, such as fatty meat. ? Sweets, desserts, sugary drinks, and other foods with added sugar. ? Full-fat dairy products.  Do not salt foods before eating.  Try to eat at least 2 vegetarian meals each week.  Eat more home-cooked food and less restaurant, buffet, and fast food.  When eating at a restaurant, ask that your food be prepared with less salt or no salt, if possible. What foods are recommended? The items listed may not be a complete list. Talk with your dietitian about what dietary choices are best for you. Grains Whole-grain or whole-wheat bread. Whole-grain or whole-wheat pasta. Brown rice. [place]ena Morrow. Bulgur. Whole-grain and low-sodium cereals. Pita bread. Low-fat, low-sodium crackers. Whole-wheat flour tortillas. Vegetables Fresh or frozen vegetables (raw, steamed, roasted, or grilled). Low-sodium or reduced-sodium tomato and vegetable juice. Low-sodium or reduced-sodium tomato sauce and tomato paste. Low-sodium or reduced-sodium canned vegetables. Fruits All fresh, dried, or frozen fruit. Canned fruit in natural juice (without added sugar). Meat and other protein foods Skinless chicken or [place]. Ground chicken or [place]. Pork with fat trimmed off. Fish and seafood. Egg whites. Dried beans, peas, or lentils. Unsalted nuts, nut butters, and seeds. Unsalted canned beans. Lean cuts of beef with fat trimmed off. Low-sodium, lean deli meat. Dairy Low-fat (1%) or fat-free (skim) milk. Fat-free, low-fat, or reduced-fat cheeses. Nonfat, low-sodium ricotta or cottage cheese. Low-fat or nonfat yogurt. Low-fat, low-sodium cheese. Fats and oils Soft margarine without trans fats. Vegetable oil. Low-fat, reduced-fat, or light mayonnaise and salad dressings (reduced-sodium). Canola, safflower, olive, soybean, and sunflower oils. Avocado. Seasoning and other foods Herbs. Spices. Seasoning mixes without salt.  Unsalted popcorn and pretzels. Fat-free sweets. What foods are not recommended? The items listed may not be a complete list. Talk with your dietitian about what dietary choices are best for you. Grains Baked goods made with fat, such as croissants, muffins, or some breads. Dry pasta or rice meal packs. Vegetables Creamed or fried vegetables. Vegetables in a cheese sauce. Regular canned vegetables (not low-sodium or reduced-sodium). Regular canned tomato sauce and paste (not low-sodium or reduced-sodium). Regular tomato and vegetable juice (not low-sodium or reduced-sodium). Angie Fava. Olives. Fruits Canned fruit in a light or heavy syrup. Fried fruit. Fruit in cream or butter sauce. Meat and other protein foods Fatty cuts of meat. Ribs. Fried meat. Berniece Salines. Sausage. Bologna and other processed lunch meats. Salami. Fatback. Hotdogs. Bratwurst. Salted nuts and seeds. Canned beans with added salt. Canned or smoked fish. Whole eggs or egg yolks. Chicken or [place] with skin. Dairy Whole or 2% milk, cream, and half-and-half. Whole or full-fat cream cheese. Whole-fat or sweetened yogurt. Full-fat cheese. Nondairy creamers. Whipped toppings. Processed cheese and cheese spreads. Fats and oils Butter. Stick margarine. Lard. Shortening. Ghee. Bacon fat. Tropical oils, such as coconut, palm kernel, or palm oil. Seasoning  and other foods Salted popcorn and pretzels. Onion salt, garlic salt, seasoned salt, table salt, and sea salt. Worcestershire sauce. Tartar sauce. Barbecue sauce. Teriyaki sauce. Soy sauce, including reduced-sodium. Steak sauce. Canned and packaged gravies. Fish sauce. Oyster sauce. Cocktail sauce. Horseradish that you find on the shelf. Ketchup. Mustard. Meat flavorings and tenderizers. Bouillon cubes. Hot sauce and Tabasco sauce. Premade or packaged marinades. Premade or packaged taco seasonings. Relishes. Regular salad dressings. Where to find more information:  National Heart, Lung, and [place]: https://wilson-eaton.com/  American Heart Association: www.heart.org Summary  The DASH eating plan is a healthy eating plan that has been shown to reduce high blood pressure (hypertension). It may also reduce your risk for type 2 diabetes, heart disease, and stroke.  With the DASH eating plan, you should limit salt (sodium) intake to 2,300 mg a day. If you have hypertension, you may need to reduce your sodium intake to 1,500 mg a day.  When on the DASH eating plan, aim to eat more fresh fruits and vegetables, whole grains, lean proteins, low-fat dairy, and heart-healthy fats.  Work with your health care provider or diet and nutrition specialist (dietitian) to adjust your eating plan to your individual calorie needs. This information is not intended to replace advice given to you by your health care provider. Make sure you discuss any questions you have with your health care provider. Document Released: 08/22/2011 Document Revised: 08/15/2017 Document Reviewed: 08/26/2016 Elsevier Patient Education  2020 [employer].

## 2019-04-13 LAB — BASIC METABOLIC PANEL
BUN/Creatinine Ratio: 17 (ref 9–23)
BUN: 17 mg/dL (ref 6–24)
CO2: 24 mmol/L (ref 20–29)
Calcium: 10.5 mg/dL — ABNORMAL HIGH (ref 8.7–10.2)
Chloride: 102 mmol/L (ref 96–106)
Creatinine, Ser: 1.01 mg/dL — ABNORMAL HIGH (ref 0.57–1.00)
GFR calc Af Amer: 72 mL/min/{1.73_m2} (ref >59)
GFR calc non Af Amer: 63 mL/min/{1.73_m2} (ref >59)
Glucose: 143 mg/dL — ABNORMAL HIGH (ref 65–99)
Potassium: 4.4 mmol/L (ref 3.5–5.2)
Sodium: 140 mmol/L (ref 134–144)

## 2019-07-12 ENCOUNTER — Telehealth: Payer: Self-pay | Admitting: Cardiology

## 2019-07-12 ENCOUNTER — Encounter: Payer: Self-pay | Admitting: Physician Assistant

## 2019-07-12 NOTE — Telephone Encounter (Signed)
Please get in with extender this week

## 2019-07-12 NOTE — Telephone Encounter (Signed)
New Message  Pt c/o swelling: STAT is pt has developed SOB within 24 hours  1) How much weight have you gained and in what time span? 4-5 lbs  2) If swelling, where is the swelling located? Ankles and Lower Legs (numbness)  3) Are you currently taking a fluid pill? Yes, Lasix  4) Are you currently SOB? Yes, a lot of wheezing  5) Do you have a log of your daily weights (if so, list)? N/A  6) Have you gained 3 pounds in a day or 5 pounds in a week? Yes  7) Have you traveled recently? No

## 2019-07-12 NOTE — Telephone Encounter (Signed)
I spoke to the patient and scheduled her with Dayna on 10/27 @ 9 am per Dr Radford Pax.  The patient verbalized understanding.

## 2019-07-12 NOTE — Progress Notes (Addendum)
Cardiology Office Note    Date:  07/13/2019   ID:  Lindsay Hayden, DOB 11-12-1960, MRN [id number]  PCP:  Kathyrn Lass, MD  Cardiologist:  Fransico Him, MD  Electrophysiologist:  None   Chief Complaint: cough, sockline swelling, weight gain, fatigue - also reports Covid-19 exposure with coworkers  History of Present Illness:   Lindsay Hayden is a 58 y.o. female with history of NICM presumed to be hypertensive in etiology, chronic combined CHF, hypertension, left bundle branch block, obstructive sleep apnea, hyperlipidemia, DM and obesity who presents for evaluation of cough, wheezing, and mild swelling.  She was previously followed by Dr. Wynonia Lawman and recently established care with Dr. Radford Pax. Per patient report she was found to have low EF in 2006 and they did a heart cath around that time, as well as one a few years later without any findings of coronary obstruction. She was also found to be hypertensive at that time around 150/90s. Records are not on file. She does recall being told at one point her EF had improved somewhat but that was years ago. She had no history of heavy ETOH or illicit drug use. No family history of cardiomyopathy. She reports a significant viral illness in 2001 but this was years before diagnosis of CHF. Most recent echo 03/2019 showed EF 35-40%, moderate LVH, pseudonormalization, LBBB, normal RVSP. Earlier this year she was changed from ACEI to [facility]. She was also previously on hydralazine but did not tolerate nighttime dosing due to tinnitus. Last labs 04/12/19 showed K 4.4, Cr 1.01 (similar to prior baseline), calcium 10.5.  She returns to clinic today because over the past 5 days has noticed intermittent cough. She has also noticed a tendency to see her sockline indenting in her legs as well. The cough is nonproductive but she states it sounds like she would be able to bring something up. She is not coughing during the visit today. She hadn't been weighing  herself recently but stepped on the scale to note she's gained about 4-5lb from prior values at home. She has noticed increasing wheezing. She has previously had to use an inhaler. Her answer is somewhat noncommittal when discussing worsening shortness of breath. She mentioned this to triage but today states her breathing has not changed much recently. She has some degree of chronic dyspnea on exertion, but her concerns are moreso recently with the cough and wheezing. 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 She tested negative in August. She tries to use a mask but sometimes has to pull it down briefly due intolerance of breathing. She does not report any chest pain, orthopnea, palpitations or syncope. She denies any fever but possibly has had chills; she's not totally sure. She noticed increased fatigue the last few days. If she lies on the couch she wants to fall asleep sooner than usual. She has been compliant with CPAP.    Past Medical History:  Diagnosis Date  . Asthma   . Chronic combined systolic and diastolic CHF (congestive heart failure) ([place])   . Diabetes mellitus without complication ([place])   . Hyperlipidemia   . Hypertension   . LBBB (left bundle branch block)   . LVH (left ventricular hypertrophy)   . NICM (nonischemic cardiomyopathy) ([place])   . OSA (obstructive sleep apnea)   . Prediabetes   . Sleep apnea     Past Surgical History:  Procedure Laterality Date  . ACHILLES  TENDON SURGERY     right  . CESAREAN SECTION     x2  . KNEE ARTHROSCOPY WITH MENISCAL REPAIR     right knee  . OVARIAN CYST REMOVAL     right    Current Medications: Current Meds  Medication Sig  . albuterol (PROVENTIL HFA;VENTOLIN HFA) 108 (90 Base) MCG/ACT inhaler Inhale into the lungs every 6 (six) hours as needed for wheezing or shortness of breath.  Marland Kitchen atorvastatin (LIPITOR) 10 MG tablet Take 10 mg by mouth daily.  .  cholecalciferol (VITAMIN D) 1000 units tablet Take 5,000 Units by mouth daily.  . furosemide (LASIX) 20 MG tablet Take 1 tablet (20 mg total) by mouth daily.  Marland Kitchen LEVOXYL 88 MCG tablet Take 1 tablet by mouth daily.  . meclizine (ANTIVERT) 25 MG tablet Take 1 tablet (25 mg total) by mouth 3 (three) times daily as needed for dizziness.  . metoprolol succinate (TOPROL-XL) 25 MG 24 hr tablet Take 1 tablet (25 mg total) by mouth daily.  . sacubitril-valsartan (ENTRESTO) 24-26 MG Take 1 tablet by mouth 2 (two) times daily.  Marland Kitchen spironolactone (ALDACTONE) 25 MG tablet Take 1 tablet (25 mg total) by mouth daily.  Marland Kitchen tiZANidine (ZANAFLEX) 4 MG tablet Take 4 mg by mouth as needed. As needed for muscle spasms      Allergies:   Patient has no known allergies.   Social History   Socioeconomic History  . Marital status: Single    Spouse name: Not on file  . Number of children: 2  . Years of education: AD  . Highest education level: Not on file  Occupational History  . Occupation: Scientific laboratory technician: [employer]  Social Needs  . Financial resource strain: Not on file  . Food insecurity    Worry: Not on file    Inability: Not on file  . Transportation needs    Medical: Not on file    Non-medical: Not on file  Tobacco Use  . Smoking status: Never Smoker  . Smokeless tobacco: Never Used  Substance and Sexual Activity  . Alcohol use: Yes    Alcohol/week: 1.0 standard drinks    Types: 1 Glasses of wine per week    Comment: 1-2 times a month  . Drug use: No  . Sexual activity: Not on file  Lifestyle  . Physical activity    Days per week: Not on file    Minutes per session: Not on file  . Stress: Not on file  Relationships  . Social Herbalist on phone: Not on file    Gets together: Not on file    Attends religious service: Not on file    Active member of club or organization: Not on file    Attends meetings of clubs or organizations: Not on file    Relationship status: Not on  file  Other Topics Concern  . Not on file  Social History Narrative   Patient is single, lives with boyfriend in a single story home, though does have a full basement that she rarely goes down the stairs to. No pets.     Family History:  The patient's family history includes Stroke in her mother.  ROS:   Please see the history of present illness. She had one episode recently where she woke up one night and her left arm was more cold than the right. This resolved spontaneously and has not recurred. All other systems are reviewed and otherwise negative.  EKGs/Labs/Other Studies Reviewed:    Studies reviewed were summarized above.   EKG:  EKG is ordered today, personally reviewed, demonstrating NSR 68bpm, biatrial enlargement, right axis deviation, LBBB  Recent Labs: 04/12/2019: BUN 17; Creatinine, Ser 1.01; Potassium 4.4; Sodium 140  Recent Lipid Panel No results found for: CHOL, TRIG, HDL, CHOLHDL, VLDL, LDLCALC, LDLDIRECT  PHYSICAL EXAM:    VS:  BP 120/64   Pulse 68   Ht 5\' 3"  (1.6 m)   Wt 194 lb 6.4 oz (88.2 kg)   SpO2 97%   BMI 34.44 kg/m   BMI: Body mass index is 34.44 kg/m.  GEN: Well nourished, well developed AAF, in no acute distress - nontoxic, well appearing HEENT: normocephalic, atraumatic Neck: no JVD, carotid bruits, or masses Cardiac: RRR; no murmurs, rubs, or gallops, no appreciable edema  Respiratory:  Diminished bilaterally with faint occasional scattered expiratory wheezing, normal work of breathing, talking in full sentences GI: soft, nontender, nondistended, + BS MS: no deformity or atrophy Skin: warm and dry, no rash Neuro:  Alert and Oriented x 3, Strength and sensation are intact, follows commands Psych: euthymic mood, full affect  Wt Readings from Last 3 Encounters:  07/13/19 194 lb 6.4 oz (88.2 kg)  04/12/19 187 lb 12.8 oz (85.2 kg)  02/15/19 186 lb (84.4 kg)     ASSESSMENT & PLAN:   1. Cough - present for several days, associated with  mild edema (not present on exam today) and wheezing (present). Several coworkers have also tested positive for Covid. I think it is prudent for her to go to the [facility] testing site ASAP today to be tested. I will also check stat labs to assess her kidney function, CBC, BNP. May need to titrate Lasix although she does not actually look significantly volume overloaded. Hold off on CXR to minimize exposure at this time since she has normal oxygenation. She is not tachycardic, tachypneic, hypoxic or visibly short of breath. She actually looks very well appearing today. She will need to review Covid protocols with her job site even if testing returns normal. Would encourage she touch base with primary care after we get labs back. 2. NICM - given cough, would not titrate baseline medications at this time so as not to complicate the picture. However, given her LVH, LV dysfunction and LBBB I do feel she would benefit from cMRI at some point to discern whether amyloidosis is of concern. This will likely need to be deferred 2 weeks in case of potential Covid concerns. Reviewed 2g sodium restriction, 2L fluid restriction, daily weights with patient. She does indicate she tries to drink as much fluid as possible, which may be contributing. 3. Essential HTN - blood pressure controlled. 4. Hyperlipidemia - continue statin for now. This is not followed by our office. 5. LBBB - chronic, no symptoms of bradycardia.  Disposition: Will tentatively arrange f/u Dr. Abbey Chatters in 3 weeks pending labs, will make virtual in case Covid returns positive.   Medication Adjustments/Labs and Tests Ordered: Current medicines are reviewed at length with the patient today.  Concerns regarding medicines are outlined above. Medication changes, Labs and Tests ordered today are summarized above and listed in the Patient Instructions accessible in Encounters.   Signed, Charlie Pitter, PA-C  07/13/2019 9:33 AM    [address], [place], [place]  [id number] Phone: [phone]; Fax: [phone]

## 2019-07-12 NOTE — Telephone Encounter (Signed)
Spoke with Lindsay Hayden and over the last week has noted swelling to lower extremities ,as well as ,cough nonproductive,and SOB with activity.Per Lindsay Hayden weighs 195 and at last office visit Lindsay Hayden weighed 187 Per Lindsay Hayden no change in salt intake Lindsay Hayden is able to lay flat in bed as Lindsay Hayden elevates legs during the night Lindsay Hayden notes SOB with activity the patient taking Spironolactone 25 qd and Lasix 20 mg qd Will forward to Dr Radford Pax for reviw and recommendations .Adonis Housekeeper

## 2019-07-13 ENCOUNTER — Ambulatory Visit: Payer: POS | Admitting: Physician Assistant

## 2019-07-13 ENCOUNTER — Other Ambulatory Visit: Payer: Self-pay

## 2019-07-13 ENCOUNTER — Encounter: Payer: Self-pay | Admitting: Physician Assistant

## 2019-07-13 ENCOUNTER — Encounter: Payer: Self-pay | Admitting: *Deleted

## 2019-07-13 VITALS — BP 120/64 | HR 68 | Ht 63.0 in | Wt 194.4 lb

## 2019-07-13 DIAGNOSIS — I5042 Chronic combined systolic (congestive) and diastolic (congestive) heart failure: Secondary | ICD-10-CM

## 2019-07-13 DIAGNOSIS — E785 Hyperlipidemia, unspecified: Secondary | ICD-10-CM

## 2019-07-13 DIAGNOSIS — R059 Cough, unspecified: Secondary | ICD-10-CM

## 2019-07-13 DIAGNOSIS — I428 Other cardiomyopathies: Secondary | ICD-10-CM

## 2019-07-13 DIAGNOSIS — I10 Essential (primary) hypertension: Secondary | ICD-10-CM | POA: Diagnosis not present

## 2019-07-13 DIAGNOSIS — R05 Cough: Secondary | ICD-10-CM

## 2019-07-13 DIAGNOSIS — Z20822 Contact with and (suspected) exposure to covid-19: Secondary | ICD-10-CM

## 2019-07-13 DIAGNOSIS — I447 Left bundle-branch block, unspecified: Secondary | ICD-10-CM

## 2019-07-13 LAB — CBC
Hematocrit: 42.1 % (ref 34.0–46.6)
Hemoglobin: 14.1 g/dL (ref 11.1–15.9)
MCH: 28.5 pg (ref 26.6–33.0)
MCHC: 33.5 g/dL (ref 31.5–35.7)
MCV: 85 fL (ref 79–97)
Platelets: 315 10*3/uL (ref 150–450)
RBC: 4.95 x10E6/uL (ref 3.77–5.28)
RDW: 14.3 % (ref 11.7–15.4)
WBC: 5 10*3/uL (ref 3.4–10.8)

## 2019-07-13 LAB — COMPREHENSIVE METABOLIC PANEL
ALT: 19 IU/L (ref 0–32)
AST: 18 IU/L (ref 0–40)
Albumin/Globulin Ratio: 1.5 (ref 1.2–2.2)
Albumin: 4.7 g/dL (ref 3.8–4.9)
Alkaline Phosphatase: 101 IU/L (ref 39–117)
BUN/Creatinine Ratio: 21 (ref 9–23)
BUN: 20 mg/dL (ref 6–24)
Bilirubin Total: 0.5 mg/dL (ref 0.0–1.2)
CO2: 23 mmol/L (ref 20–29)
Calcium: 10.9 mg/dL — ABNORMAL HIGH (ref 8.7–10.2)
Chloride: 104 mmol/L (ref 96–106)
Creatinine, Ser: 0.96 mg/dL (ref 0.57–1.00)
GFR calc Af Amer: 76 mL/min/{1.73_m2} (ref >59)
GFR calc non Af Amer: 66 mL/min/{1.73_m2} (ref >59)
Globulin, Total: 3.1 g/dL (ref 1.5–4.5)
Glucose: 125 mg/dL — ABNORMAL HIGH (ref 65–99)
Potassium: 4.5 mmol/L (ref 3.5–5.2)
Sodium: 138 mmol/L (ref 134–144)
Total Protein: 7.8 g/dL (ref 6.0–8.5)

## 2019-07-13 LAB — PRO B NATRIURETIC PEPTIDE: NT-Pro BNP: 99 pg/mL (ref 0–287)

## 2019-07-13 LAB — TSH: TSH: 3.18 u[IU]/mL (ref 0.450–4.500)

## 2019-07-13 NOTE — Patient Instructions (Addendum)
Medication Instructions:  Your physician recommends that you continue on your current medications as directed. Please refer to the Current Medication list given to you today.  *If you need a refill on your cardiac medications before your next appointment, please call your pharmacy*  Lab Work: TODAY:  STAT, CMET, CBC, PRO BNP, & TSH  GO TO [place], [place], [place].  GO UNDER THE TENT LINE TO BE TESTED FOR COVID, AS SOON AS YOU LEAVE HERE.  If you have labs (blood work) drawn today and your tests are completely normal, you will receive your results only by: Marland Kitchen MyChart Message (if you have MyChart) OR . A paper copy in the mail If you have any lab test that is abnormal or we need to change your treatment, we will call you to review the results.  Testing/Procedures: Your physician has requested that you have a cardiac MRI. Cardiac (they will call to schedule)  MRI uses a computer to create images of your heart as its beating, producing both still and moving pictures of your heart and major blood vessels. For further information please visit [email or web address] Please follow the instruction sheet given to you today for more information.    Follow-Up: At [facility], you and your health needs are our priority.  As part of our continuing mission to provide you with exceptional heart care, we have created designated Provider Care Teams.  These Care Teams include your primary Cardiologist (physician) and Advanced Practice Providers (APPs -  Physician Assistants and Nurse Practitioners) who all work together to provide you with the care you need, when you need it.  Your next appointment:   08/10/2019 9:00 A.M.   Please have your vitals checked and ready by 8:45 a.m.  The format for your next appointment:   Virtual Visit via Video   Provider:   Melina Copa, PA-C   Other Instructions:   Nuclear Medicine Exam A nuclear medicine exam is a safe and painless  imaging test. It helps your health care provider detect and diagnose diseases. It also provides information about the ways your organs work and how they are structured. For a nuclear medicine exam, you will be given a radioactive tracer. This substance is absorbed by your body's organs. A large scanning machine detects the tracer and creates pictures of the areas that your health care provider wants to know more about. There are several kinds of nuclear medicine exams. They include the following:  CT scan.  MRI scan.  PET scan.  SPECT scan. Tell your health care provider about:  Any allergies you have.  All medicines you are taking, including vitamins, herbs, eye drops, creams, and over-the-counter medicines.  Any problems you or family members have had with anesthetic medicines.  Any blood disorders you have.  Any surgeries you have had.  Any medical conditions you have.  Whether you are pregnant or may be pregnant.  Whether you are nursing. What are the risks? Generally, this is a safe procedure. However, problems may occur, such as an allergic reaction to the tracer, but this is rare. What happens before the procedure? Medicines Ask your health care provider about:  Changing or stopping your regular medicines. This is especially important if you are taking diabetes medicines or blood thinners.  Taking medicines such as aspirin and ibuprofen. These medicines can thin your blood. Do not take these medicines unless your health care provider tells you to take them.  Taking over-the-counter medicines, vitamins, herbs,  and supplements. General instructions  Follow instructions from your health care provider about eating or drinking restrictions.  Do not wear jewelry.  Wear loose, comfortable clothing. You may be asked to wear a hospital gown for the procedure.  Bring previous imaging studies, such as X-rays, with you to the exam if they are available. What happens during  the procedure?   An IV may be inserted into one of your veins.  You will be asked to lie on a table or sit in a chair.  You will be given the radioactive tracer. You may get: ? A pill or liquid to swallow. ? An injection. ? Medicine through your IV. ? A gas to inhale.  A large scanning machine will be used to create images of your body. After the pictures are taken, you may have to wait so your health care provider can make sure that enough images were taken. The procedure may vary among health care providers and hospitals. What happens after the procedure?  You may go home after the procedure and return to your usual activities, unless your health care provider tells you otherwise.  Drink enough water to keep your urine pale yellow. This helps to remove the radioactive tracer from your body.  It is up to you to get the results of your procedure. Ask your health care provider, or the department that is doing the procedure, when your results will be ready.  Get help right away if you have problems breathing. Summary  A nuclear medicine exam is a safe and painless imaging test that provides information about how your organs are working. It is also used to detect and diagnose diseases of various body organs.  Follow your health care provider's instructions about eating and drinking restrictions. Ask whether you should change or stop any medicines.  During the procedure, you will be given a radioactive tracer. A large scanning machine will create images of your body.  You may go home after the procedure and return to your regular activities. Follow your health care provider's instructions.  Get help right away if you have problems breathing. This information is not intended to replace advice given to you by your health care provider. Make sure you discuss any questions you have with your health care provider. Document Released: 10/10/2004 Document Revised: 07/22/2018 Document  Reviewed: 07/22/2018 Elsevier Patient Education  2020 [employer].

## 2019-07-15 LAB — NOVEL CORONAVIRUS, NAA: SARS-CoV-2, NAA: NOT DETECTED

## 2019-07-16 ENCOUNTER — Telehealth: Payer: Self-pay | Admitting: Physician Assistant

## 2019-07-16 NOTE — Telephone Encounter (Signed)
Returned call to pt.  She has been made aware of her covid test results. See result note.

## 2019-07-16 NOTE — Telephone Encounter (Signed)
New message   Patient calling for covid results

## 2019-07-22 ENCOUNTER — Encounter: Payer: Self-pay | Admitting: *Deleted

## 2019-07-22 ENCOUNTER — Telehealth: Payer: Self-pay | Admitting: *Deleted

## 2019-07-22 NOTE — Telephone Encounter (Signed)
Left message for patient regardng appointment for Cardiac MRI scheduled 08/09/19 at 12:00pm--arrival time is 11:15 am 1st floor admissions office at [facility].  Will mail letter to patient and information is in My Chart

## 2019-08-04 ENCOUNTER — Other Ambulatory Visit (HOSPITAL_COMMUNITY): Payer: POS

## 2019-08-06 ENCOUNTER — Telehealth (HOSPITAL_COMMUNITY): Payer: Self-pay | Admitting: Emergency Medicine

## 2019-08-06 NOTE — Telephone Encounter (Signed)
Left message on voicemail with name and callback number Amante Fomby RN Navigator Cardiac Imaging [facility] [phone] Office [phone] Cell  

## 2019-08-09 ENCOUNTER — Other Ambulatory Visit: Payer: Self-pay

## 2019-08-09 ENCOUNTER — Ambulatory Visit (HOSPITAL_COMMUNITY)
Admission: RE | Admit: 2019-08-09 | Discharge: 2019-08-09 | Disposition: A | Discharge status: Home / Self Care | Payer: POS | Source: Ambulatory Visit | Attending: Physician Assistant | Admitting: Physician Assistant

## 2019-08-09 DIAGNOSIS — I5042 Chronic combined systolic (congestive) and diastolic (congestive) heart failure: Secondary | ICD-10-CM

## 2019-08-09 DIAGNOSIS — R059 Cough, unspecified: Secondary | ICD-10-CM

## 2019-08-09 DIAGNOSIS — R05 Cough: Secondary | ICD-10-CM | POA: Insufficient documentation

## 2019-08-09 DIAGNOSIS — I428 Other cardiomyopathies: Secondary | ICD-10-CM

## 2019-08-09 MED ORDER — GADOBUTROL 1 MMOL/ML IV SOLN
10.0000 mL | Freq: Once | INTRAVENOUS | Status: AC | PRN
  Administered 2019-08-09: 10 mL via INTRAVENOUS

## 2019-08-09 NOTE — Progress Notes (Signed)
Virtual Visit via Telephone Note   This visit type was conducted due to national recommendations for restrictions regarding the COVID-19 Pandemic (e.g. social distancing) in an effort to limit this patient's exposure and mitigate transmission in our community.  Due to her co-morbid illnesses, this patient is at least at moderate risk for complications without adequate follow up.  This format is felt to be most appropriate for this patient at this time.  The patient had technical difficulties with video requiring transitioning to audio format only (telephone).  All issues noted in this document were discussed and addressed.  No physical exam could be performed with this format.   Please refer to the patient's chart for her consent to telehealth for [facility] - she consented verbally at time of last OV. Virtual platform was offered given ongoing worsening Covid-19 pandemic.  Date:  08/10/2019   ID:  Lindsay Hayden, DOB November 23, 1960, MRN [id number]  Patient Location: Home Provider Location: Home  PCP:  Kathyrn Lass, MD   Cardiologist:  Fransico Him, MD  Electrophysiologist:  None   Evaluation Performed:  Follow-Up Visit  Chief Complaint:  F/u cardiomyopathy and cardiac MRI  History of Present Illness:    Lindsay Hayden is a 58 y.o. female with history of NICM presumed to be hypertensive in etiology, chronic combined CHF, hypertension, left bundle branch block, obstructive sleep apnea, hyperlipidemia (followed by primary care), asthma, DM and obesity who presents to follow-up cardiac MRI.  She was previously followed by Dr. Wynonia Lawman and recently established care with Dr. Radford Pax. Per patient report she was found to have low EF in 2006 and they did a heart cath around that time, as well as one a few years later without any findings of coronary obstruction. She was also found to be hypertensive at that time around 150/90s. Records are not on file. She does recall being told at one  point her EF had improved somewhat but that was years ago. She had no history of heavy ETOH or illicit drug use. No family history of cardiomyopathy. She reported a significant viral illness in 2001 but this was years before diagnosis of CHF. She also recalls development of asthma around that time. Most recent echo 03/2019 showed EF 35-40%, moderate LVH, pseudonormalization, LBBB, and normal RVSP. Earlier this year she was changed from ACEI to [facility]. She was also previously on hydralazine but did not tolerate nighttime dosing due to tinnitus.   She presented back to clinic 07/13/19 for evaluation of cough. She had recalled being told in the past to call if she had any weight gain. She had not been weighing recently but stepped on the scale and noticed a 4-5lb weight gain from prior values in the setting of less activity during the pandemic. She also reported increased wheezing. She reported sockline edema but examination was unrevealing for signs of fluid overload. She had no orthopnea or PND. Her BNP was completely normal and other labs showed normal TSH, K 4.5, Cr 0.96, glucose 125 and persistent hypercalcemia at 10.9. She also reported exposure to Covid-19 positive coworkers so was sent for a Covid test which was negative. She was encouraged to reach out to primary care for further evaluation. As part of her cardiac workup for NICM, cardiac MRI was done 08/09/19 and showed moderate LVE with diffuse hypokinesis, EF 43%, no infarct/scar and no evidence of amyloidosis on delayed inversion recovery sequences, mild MR, mild LAE, normal RV size and function.  She is seen back virtually for follow-up.  Her chills and fatigue have resolved. She still has some wheezing, exacerbated by wind and exertion. She uses her inhaler and it resolves. She also relays a continued intermittent cough. The chronicity is a little difficult to understand because it sounds like it's actually waxed and waned for quite some time. No  worsening CP, edema, orthopnea reported. Weight is down 1lb. (Of note, her weight tends to fluctuate - was as high as 198 previously.) She continues to feel well enough to work 12 hour shifts at the [employer]. Her BP is mildly elevated but in the context of some frustration over her BP cuff. She reports it is normally 123456 systolic or less over [id number].    Past Medical History:  Diagnosis Date   Asthma    Chronic combined systolic and diastolic CHF (congestive heart failure) (HCC)    Diabetes mellitus without complication (HCC)    Hyperlipidemia    Hypertension    LBBB (left bundle branch block)    LVH (left ventricular hypertrophy)    NICM (nonischemic cardiomyopathy) (HCC)    OSA (obstructive sleep apnea)    Prediabetes    Sleep apnea    Past Surgical History:  Procedure Laterality Date   ACHILLES TENDON SURGERY     right   CESAREAN SECTION     x2   KNEE ARTHROSCOPY WITH MENISCAL REPAIR     right knee   OVARIAN CYST REMOVAL     right     Current Meds  Medication Sig   albuterol (PROVENTIL HFA;VENTOLIN HFA) 108 (90 Base) MCG/ACT inhaler Inhale into the lungs every 6 (six) hours as needed for wheezing or shortness of breath.   atorvastatin (LIPITOR) 10 MG tablet Take 10 mg by mouth daily.   cholecalciferol (VITAMIN D) 1000 units tablet Take 5,000 Units by mouth daily.   furosemide (LASIX) 20 MG tablet Take 1 tablet (20 mg total) by mouth daily.   LEVOXYL 88 MCG tablet Take 1 tablet by mouth daily.   meclizine (ANTIVERT) 25 MG tablet Take 1 tablet (25 mg total) by mouth 3 (three) times daily as needed for dizziness.   metoprolol succinate (TOPROL-XL) 25 MG 24 hr tablet Take 1 tablet (25 mg total) by mouth daily.   sacubitril-valsartan (ENTRESTO) 24-26 MG Take 1 tablet by mouth 2 (two) times daily.   spironolactone (ALDACTONE) 25 MG tablet Take 1 tablet (25 mg total) by mouth daily.   tiZANidine (ZANAFLEX) 4 MG tablet Take 4 mg by mouth as needed. As  needed for muscle spasms     Allergies:   Patient has no known allergies.   Social History   Tobacco Use   Smoking status: Never Smoker   Smokeless tobacco: Never Used  Substance Use Topics   Alcohol use: Yes    Alcohol/week: 1.0 standard drinks    Types: 1 Glasses of wine per week    Comment: 1-2 times a month   Drug use: No     Family Hx: The patient's family history includes Stroke in her mother.  ROS:   Please see the history of present illness.    All other systems reviewed and are negative.   Prior CV studies:    Most recent pertinent cardiac studies are outlined above.  Labs/Other Tests and Data Reviewed:    EKG:  An ECG dated 07/13/19 was personally reviewed today and demonstrated:  NSR 68bpm, biatrial enlargement, right axis deviation, LBBB  Recent Labs: 07/13/2019: ALT 19; BUN 20; Creatinine, Ser 0.96; Hemoglobin 14.1; NT-Pro  BNP 99; Platelets 315; Potassium 4.5; Sodium 138; TSH 3.180   Recent Lipid Panel No results found for: CHOL, TRIG, HDL, CHOLHDL, LDLCALC, LDLDIRECT  Wt Readings from Last 3 Encounters:  08/10/19 193 lb (87.5 kg)  07/13/19 194 lb 6.4 oz (88.2 kg)  04/12/19 187 lb 12.8 oz (85.2 kg)     Objective:    Vital Signs:  BP 139/74    Pulse 70    Ht 5\' 2"  (1.575 m)    Wt 193 lb (87.5 kg)    BMI 35.30 kg/m    VS reviewed. General - pleasant female in no acute distress Pulm - No labored breathing, no coughing during visit, no audible wheezing, speaking in full sentences Neuro - A+Ox3, no slurred speech, answers questions appropriately Psych - Pleasant affect     ASSESSMENT & PLAN:    1. Cough - worsened recently associated with increased intermittent wheezing, improved by her inhaler. She has had asthma for longer than a decade. Her recent physical examination and BNP were not consistent with acute heart failure - would have expected more signs of fluid overload if it were causing this wheezing. I also would not expect the low dose  of metoprolol she is on to be contributing as this is typically quite selective. At last OV we asked her reach out to primary care to discuss further pulmonary treatment but she has not done so yet. This recommendation was reinforced today as I suspect she needs some uptitration of her asthma regimen with close follow-up. Will arrange for her to have a 2V CXR for completeness. 2. NICM/Chronic combined CHF - cMRI results reviewed - were reassuring. No evidence of amyloidosis. LVEF actually slightly improved from summer echo. She appeared relatively euvolemic at last visit. I would like to [employer], but would prefer her pulmonary status be optimized before doing so so that we do not complicate the picture. (In rare cases, the ARB in Entresto can exacerbate cough.) I have asked her to reach out to [place] after she speaks with primary care to let us know the plan. 3. Essential HTN - reports BP trends as above. She was stressed with her BP cuff not working today so BP slightly higher than usual. Continue present regimen for now with an eye towards titrating Entresto in the future. 4. Hypercalcemia - this has been a persistent issue in the past as well. We do not have full access to PCP records so I am not certain what workup has been done for this. Encourage f/u with primary care for further workup.   COVID-19 Education: Discussed at last OV so did not revisit today. We did discuss importance of following up with primary care for control of asthma.  Time:   Today, I have spent 20 minutes with the patient with telehealth technology discussing the above problems.     Medication Adjustments/Labs and Tests Ordered: Current medicines are reviewed at length with the patient today.  Concerns regarding medicines are outlined above.   Follow Up:  In Person 2 months with me  Signed, Charlie Pitter, PA-C  08/10/2019 9:22 AM    [place]

## 2019-08-10 ENCOUNTER — Encounter: Payer: Self-pay | Admitting: Physician Assistant

## 2019-08-10 ENCOUNTER — Telehealth (INDEPENDENT_AMBULATORY_CARE_PROVIDER_SITE_OTHER): Payer: POS | Admitting: Physician Assistant

## 2019-08-10 VITALS — BP 139/74 | HR 70 | Ht 62.0 in | Wt 193.0 lb

## 2019-08-10 DIAGNOSIS — R05 Cough: Secondary | ICD-10-CM | POA: Diagnosis not present

## 2019-08-10 DIAGNOSIS — I428 Other cardiomyopathies: Secondary | ICD-10-CM

## 2019-08-10 DIAGNOSIS — I5042 Chronic combined systolic (congestive) and diastolic (congestive) heart failure: Secondary | ICD-10-CM

## 2019-08-10 DIAGNOSIS — I10 Essential (primary) hypertension: Secondary | ICD-10-CM

## 2019-08-10 DIAGNOSIS — R059 Cough, unspecified: Secondary | ICD-10-CM

## 2019-08-10 NOTE — Patient Instructions (Addendum)
Medication Instructions:  Your physician recommends that you continue on your current medications as directed. Please refer to the Current Medication list given to you today.  *If you need a refill on your cardiac medications before your next appointment, please call your pharmacy*  Lab Work: None ordered but, I forgot to mention, that you also had elevated calcium level on your recent labwork - Please have this repeated by primary care.  If you are taking any calcium supplements please stop.  If you have labs (blood work) drawn today and your tests are completely normal, you will receive your results only by: Marland Kitchen MyChart Message (if you have MyChart) OR . A paper copy in the mail If you have any lab test that is abnormal or we need to change your treatment, we will call you to review the results.  Testing/Procedures: A chest x-ray takes a picture of the organs and structures inside the chest, including the heart, lungs, and blood vessels. This test can show several things, including, whether the heart is enlarges; whether fluid is building up in the lungs; and whether pacemaker / defibrillator leads are still in place.  The latest you can do a Chest X-Ray is 4:30 Monday - Friday. You will go to [facility], [address], [place], [place] [address] Phone:  [phone]   Follow-Up: At [facility], you and your health needs are our priority.  As part of our continuing mission to provide you with exceptional heart care, we have created designated Provider Care Teams.  These Care Teams include your primary Cardiologist (physician) and Advanced Practice Providers (APPs -  Physician Assistants and Nurse Practitioners) who all work together to provide you with the care you need, when you need it.  Your next appointment:   2 month(s)   10/07/2019 ARRIVE AT 7:45 FOR REGISTRATION  The format for your next appointment:   In Person  Provider:   Melina Copa, PA-C  Other  Instructions Please touch base with your primary care provider today about an action plan for your asthma. After you speak, please call us and let us know the plan so we can decide our next steps with [facility]

## 2019-08-23 ENCOUNTER — Ambulatory Visit
Admission: RE | Admit: 2019-08-23 | Discharge: 2019-08-23 | Disposition: A | Discharge status: Home / Self Care | Payer: POS | Source: Ambulatory Visit | Attending: Physician Assistant | Admitting: Physician Assistant

## 2019-08-23 DIAGNOSIS — R05 Cough: Secondary | ICD-10-CM

## 2019-08-23 DIAGNOSIS — R059 Cough, unspecified: Secondary | ICD-10-CM

## 2019-08-24 ENCOUNTER — Telehealth: Payer: Self-pay | Admitting: *Deleted

## 2019-08-24 NOTE — Telephone Encounter (Signed)
Call placed to pt re: Chest Xray results, left a message for pt to call back.

## 2019-08-24 NOTE — Telephone Encounter (Signed)
-----   Message from Charlie Pitter, [place] sent at 08/23/2019  5:53 PM EST ----- CXR without active lung disease. Enlarged heart is present (known from history). F/u PCP.

## 2019-08-24 NOTE — Telephone Encounter (Signed)
Pt returned my call and she has been made aware of her CXR results and verbalized understanding.

## 2019-10-04 ENCOUNTER — Encounter: Payer: Self-pay | Admitting: Physician Assistant

## 2019-10-04 NOTE — Progress Notes (Signed)
Cardiology Office Note    Date:  10/07/2019   ID:  Lindsay Hayden, DOB 10/07/1960, MRN [id number]  PCP:  Kathyrn Lass, MD  Cardiologist:  Fransico Him, MD  Electrophysiologist:  None   Chief Complaint: f/u CHF  History of Present Illness:   Lindsay Hayden is a 59 y.o. female with history of NICM presumed to be hypertensive in etiology, chronic combined CHF, hypertension, left bundle branch block, obstructive sleep apnea, hyperlipidemia (followed by primary care), asthma, DM and obesity who presents to follow up her cardiomyopathy.  She was previously followed by Dr. Wynonia Lawman then established care with Dr. Radford Pax in 02/2019. Per patient report she was found to have low EF in 2006 and they did a heart cath around that time, as well as one a few years later without any findings of coronary obstruction. She was also found to be hypertensive at that time around 150/90s. She had no history of heavy ETOH or illicit drug use. No family history of cardiomyopathy. She reported a significant viral illness in 2001 but this was years before diagnosis of CHF. Records are not on file. She also recalls development of asthma around that time. She does recall being told at one point her EF had improved somewhat but that was years ago. F/u echo 03/2019 showed EF 35-40%, moderate LVH, pseudonormalization, LBBB, and normal RVSP. Earlier this year she was changed from ACEI to [facility]. She was also previously on hydralazine but did not tolerate nighttime dosing due to tinnitus.   She presented back to clinic 07/13/19 for evaluation of cough. She had recalled being told in the past to call if she had any weight gain. She had not been weighing recently but stepped on the scale and noticed a 4-5lb weight gain from prior values in the setting of less activity during the pandemic. Her weight has tended to fluctuate over the last several years. She also reported increased wheezing. She had self-reported sockline  edema but examination was unrevealing for signs of fluid overload. She had no orthopnea or PND. Her BNP was completely normal and other labs showed normal TSH, K 4.5, Cr 0.96, glucose 125 and persistent hypercalcemia at 10.9. She also reported exposure to Covid-19 positive coworkers so was sent for a Covid test which was negative. She was encouraged to reach out to primary care for further evaluation. As part of her cardiac workup for NICM, cardiac MRI was done 08/09/19 and showed moderate LVE with diffuse hypokinesis, EF 43%, no infarct/scar and no evidence of amyloidosis on delayed inversion recovery sequences, mild MR, mild LAE, normal RV size and function.  CXR 08/23/19 showed mild cardiomegaly but no acute abnormalities or fluid. She was asked to see her PCP for management of asthma and report back on progress so that we could consider further Entresto titration.  She returns for follow-up today feeling well. She saw her primary as directed after last visit and was placed on Flovent. This has significantly improved her wheezing. Her breathing is now at baseline. She is not reporting any new chest pain, edema or other cardiac symptoms. She is wondering if she can have lipids checked today.   Past Medical History:  Diagnosis Date  . Asthma   . Chronic combined systolic and diastolic CHF (congestive heart failure) ([place])   . Diabetes mellitus without complication ([place])   . Hypercalcemia   . Hyperlipidemia   . Hypertension   . LBBB (left bundle branch block)   . LVH (left ventricular hypertrophy)   .  NICM (nonischemic cardiomyopathy) ([place])   . OSA (obstructive sleep apnea)   . Prediabetes   . Sleep apnea     Past Surgical History:  Procedure Laterality Date  . ACHILLES TENDON SURGERY     right  . CESAREAN SECTION     x2  . KNEE ARTHROSCOPY WITH MENISCAL REPAIR     right knee  . OVARIAN CYST REMOVAL     right    Current Medications: Current Meds  Medication Sig  . albuterol  (PROVENTIL HFA;VENTOLIN HFA) 108 (90 Base) MCG/ACT inhaler Inhale into the lungs every 6 (six) hours as needed for wheezing or shortness of breath.  Marland Kitchen atorvastatin (LIPITOR) 10 MG tablet Take 10 mg by mouth daily.  . cholecalciferol (VITAMIN D) 1000 units tablet Take 5,000 Units by mouth daily.  . Fluticasone Propionate, Inhal, (FLOVENT IN) Inhale 110 mcg into the lungs 2 (two) times daily.   . furosemide (LASIX) 20 MG tablet Take 1 tablet (20 mg total) by mouth daily.  Marland Kitchen LEVOXYL 88 MCG tablet Take 1 tablet by mouth daily.  . meclizine (ANTIVERT) 25 MG tablet Take 1 tablet (25 mg total) by mouth 3 (three) times daily as needed for dizziness.  . metoprolol succinate (TOPROL-XL) 25 MG 24 hr tablet Take 1 tablet (25 mg total) by mouth daily.  . sacubitril-valsartan (ENTRESTO) 24-26 MG Take 1 tablet by mouth 2 (two) times daily.  Marland Kitchen spironolactone (ALDACTONE) 25 MG tablet Take 1 tablet (25 mg total) by mouth daily.  Marland Kitchen tiZANidine (ZANAFLEX) 4 MG tablet Take 4 mg by mouth as needed. As needed for muscle spasms    Allergies:   Patient has no known allergies.   Social History   Socioeconomic History  . Marital status: Single    Spouse name: Not on file  . Number of children: 2  . Years of education: AD  . Highest education level: Not on file  Occupational History  . Occupation: Scientific laboratory technician: [employer]  Tobacco Use  . Smoking status: Never Smoker  . Smokeless tobacco: Never Used  Substance and Sexual Activity  . Alcohol use: Yes    Alcohol/week: 1.0 standard drinks    Types: 1 Glasses of wine per week    Comment: 1-2 times a month  . Drug use: No  . Sexual activity: Not on file  Other Topics Concern  . Not on file  Social History Narrative   Patient is single, lives with boyfriend in a single story home, though does have a full basement that she rarely goes down the stairs to. No pets.   Social Determinants of Health   Financial Resource Strain:   . Difficulty of Paying  Living Expenses: Not on file  Food Insecurity:   . Worried About Charity fundraiser in the Last Year: Not on file  . Ran Out of Food in the Last Year: Not on file  Transportation Needs:   . Lack of Transportation (Medical): Not on file  . Lack of Transportation (Non-Medical): Not on file  Physical Activity:   . Days of Exercise per Week: Not on file  . Minutes of Exercise per Session: Not on file  Stress:   . Feeling of Stress : Not on file  Social Connections:   . Frequency of Communication with Friends and Family: Not on file  . Frequency of Social Gatherings with Friends and Family: Not on file  . Attends Religious Services: Not on file  . Active Member of  Clubs or Organizations: Not on file  . Attends Archivist Meetings: Not on file  . Marital Status: Not on file     Family History:  The patient's family history includes Stroke in her mother.  ROS:   Please see the history of present illness. All other systems are reviewed and otherwise negative.    EKGs/Labs/Other Studies Reviewed:    Studies reviewed are outlined and summarized above.  CMRI 08/09/19 FINDINGS: Mild LAE. Normal RA/RV. No ASD/PFO. Normal aortic root 2.9 cm. No pericardial effusion Normal TV, AV and MV. Mild MR. Normal RV size and function. Moderate basal septal hypertrophy 14 mm compared to posterior wall 9 mm. No SAM or LVOT turbulence. Moderate LVE with diffuse hypokinesis and abnormal septal motion. Quantitative EF is 43% (EDV 174 cc ESV 100 cc SV 74 cc) No infarct, infiltration or evidence of amyloid on delayed enhancement images with gadolinium  IMPRESSION: 1. Moderate LVE with diffuse hypokinesis and abnormal septal motion EF 43%  2. No infarct/scar and no evidence of amyloidosis on delayed inversion recovery sequences with gadolinium  3.  Mild MR  4.  Mild LAE  5.  Normal RV size and function   2D Echo 03/2019 IMPRESSIONS    1. The left ventricle has moderately  reduced systolic function, with an ejection fraction of 35-40%. The cavity size was normal. There is moderately increased left ventricular wall thickness. Left ventricular diastolic Doppler parameters are consistent  with pseudonormalization. Elevated left ventricular end-diastolic pressure The E/e' is 17.7. There is abnormal septal motion consistent with left bundle branch block.  2. The right ventricle has normal systolic function. The cavity was normal. There is no increase in right ventricular wall thickness. Right ventricular systolic pressure is normal with an estimated pressure of 19.5 mmHg.  3. The aortic valve is tricuspid. No stenosis of the aortic valve.    EKG:  EKG is not ordered today.  Recent Labs: 07/13/2019: ALT 19; BUN 20; Creatinine, Ser 0.96; Hemoglobin 14.1; NT-Pro BNP 99; Platelets 315; Potassium 4.5; Sodium 138; TSH 3.180  Recent Lipid Panel No results found for: CHOL, TRIG, HDL, CHOLHDL, VLDL, LDLCALC, LDLDIRECT  PHYSICAL EXAM:    VS:  BP (!) 142/78   Pulse 69   Ht 5\' 2"  (1.575 m)   Wt 202 lb (91.6 kg)   SpO2 97%   BMI 36.95 kg/m   BMI: Body mass index is 36.95 kg/m.  GEN: Well nourished, well developed obese AAF, in no acute distress HEENT: normocephalic, atraumatic Neck: no JVD, carotid bruits, or masses Cardiac: RRR; no murmurs, rubs, or gallops, no edema  Respiratory:  Very minimal quiet expiratory wheezing R lung but otherwise clear to auscultation bilaterally, normal work of breathing GI: soft, nontender, nondistended, + BS MS: no deformity or atrophy Skin: warm and dry, no rash Neuro:  Alert and Oriented x 3, Strength and sensation are intact, follows commands Psych: euthymic mood, full affect  Wt Readings from Last 3 Encounters:  10/07/19 202 lb (91.6 kg)  08/10/19 193 lb (87.5 kg)  07/13/19 194 lb 6.4 oz (88.2 kg)     ASSESSMENT & PLAN:   1. NICM/Chronic combined CHF - given that wheezing is now better controlled, will plan to titrate  Entresto to 49/51mg  BID. Recheck BMET in 2-3 weeks with a virtual OV in 3-4 weeks to assess BP and medication tolerance. Of note, her weight is up by our scale here but she is wearing boots, a heavy sweatshirt and vest as well.  Home weights have been stable in the 194-195 range. 2. LBBB - no symptoms to suggest bradycardia. Continue to monitor. 3. Essential HTN - BP mildly elevated today but has not yet taken her medications. Will follow with Entresto titration. 4. Hypercalcemia - this was noted on prior labs as well. I asked if her primary care had made any commentary on this and she does not think so. Since we are getting CMET/lipids today, we will be able to reassess. If persistently elevated, will recommend further evaluation per primary care. 5. Hyperlipidemia - she requests lipids to be drawn today since her f/us with PCP have all been virtual. 6. Obesity - weight management has been a struggle per patient. Per our discussion will refer to [facility] and [facility].  Disposition: F/u virtual office visit in 3-4 weeks to reassess response to Entresto titration.  Medication Adjustments/Labs and Tests Ordered: Current medicines are reviewed at length with the patient today.  Concerns regarding medicines are outlined above. Medication changes, Labs and Tests ordered today are summarized above and listed in the Patient Instructions accessible in Encounters.   Signed, Charlie Pitter, PA-C  10/07/2019 8:34 AM    [facility] [address], [place], [place]  [address] Phone: [phone]; Fax: [phone]

## 2019-10-07 ENCOUNTER — Other Ambulatory Visit: Payer: Self-pay

## 2019-10-07 ENCOUNTER — Encounter: Payer: Self-pay | Admitting: Physician Assistant

## 2019-10-07 ENCOUNTER — Ambulatory Visit: Payer: POS | Admitting: Physician Assistant

## 2019-10-07 VITALS — BP 142/78 | HR 69 | Ht 62.0 in | Wt 202.0 lb

## 2019-10-07 DIAGNOSIS — I10 Essential (primary) hypertension: Secondary | ICD-10-CM | POA: Diagnosis not present

## 2019-10-07 DIAGNOSIS — I428 Other cardiomyopathies: Secondary | ICD-10-CM | POA: Diagnosis not present

## 2019-10-07 DIAGNOSIS — E66812 Obesity, class 2: Secondary | ICD-10-CM

## 2019-10-07 DIAGNOSIS — E785 Hyperlipidemia, unspecified: Secondary | ICD-10-CM

## 2019-10-07 DIAGNOSIS — I447 Left bundle-branch block, unspecified: Secondary | ICD-10-CM | POA: Diagnosis not present

## 2019-10-07 DIAGNOSIS — I5042 Chronic combined systolic (congestive) and diastolic (congestive) heart failure: Secondary | ICD-10-CM

## 2019-10-07 DIAGNOSIS — Z6836 Body mass index (BMI) 36.0-36.9, adult: Secondary | ICD-10-CM

## 2019-10-07 LAB — COMPREHENSIVE METABOLIC PANEL
ALT: 22 IU/L (ref 0–32)
AST: 21 IU/L (ref 0–40)
Albumin/Globulin Ratio: 1.4 (ref 1.2–2.2)
Albumin: 4.3 g/dL (ref 3.8–4.9)
Alkaline Phosphatase: 100 IU/L (ref 39–117)
BUN/Creatinine Ratio: 18 (ref 9–23)
BUN: 19 mg/dL (ref 6–24)
Bilirubin Total: 0.7 mg/dL (ref 0.0–1.2)
CO2: 22 mmol/L (ref 20–29)
Calcium: 10.2 mg/dL (ref 8.7–10.2)
Chloride: 104 mmol/L (ref 96–106)
Creatinine, Ser: 1.04 mg/dL — ABNORMAL HIGH (ref 0.57–1.00)
GFR calc Af Amer: 69 mL/min/{1.73_m2} (ref >59)
GFR calc non Af Amer: 60 mL/min/{1.73_m2} (ref >59)
Globulin, Total: 3 g/dL (ref 1.5–4.5)
Glucose: 125 mg/dL — ABNORMAL HIGH (ref 65–99)
Potassium: 4.5 mmol/L (ref 3.5–5.2)
Sodium: 141 mmol/L (ref 134–144)
Total Protein: 7.3 g/dL (ref 6.0–8.5)

## 2019-10-07 LAB — LIPID PANEL
Chol/HDL Ratio: 3.2 ratio (ref 0.0–4.4)
Cholesterol, Total: 149 mg/dL (ref 100–199)
HDL: 46 mg/dL (ref >39)
LDL Chol Calc (NIH): 90 mg/dL (ref 0–99)
Triglycerides: 62 mg/dL (ref 0–149)
VLDL Cholesterol Cal: 13 mg/dL (ref 5–40)

## 2019-10-07 MED ORDER — ENTRESTO 49-51 MG PO TABS: 1.0000 | ORAL_TABLET | Freq: Two times a day (BID) | ORAL | 0 refills | Status: DC

## 2019-10-07 NOTE — Patient Instructions (Addendum)
Medication Instructions:  Your physician has recommended you make the following change in your medication:  1.  INCREASE the Entresto to 49/51 mg taking 1 tablet twice a day   *If you need a refill on your cardiac medications before your next appointment, please call your pharmacy*  Lab Work: TODAY:  CMET & LIPID 10/22/2019:  COME TO THE OFFICE FOR A BMET (anytime you want after 7:30)  If you have labs (blood work) drawn today and your tests are completely normal, you will receive your results only by: Marland Kitchen MyChart Message (if you have MyChart) OR . A paper copy in the mail If you have any lab test that is abnormal or we need to change your treatment, we will call you to review the results.  Testing/Procedures: None ordered  You have been referred to [place].  THEY WILL CALL YOU WITH AN APPOINTMENT  Follow-Up: At [facility], you and your health needs are our priority.  As part of our continuing mission to provide you with exceptional heart care, we have created designated Provider Care Teams.  These Care Teams include your primary Cardiologist (physician) and Advanced Practice Providers (APPs -  Physician Assistants and Nurse Practitioners) who all work together to provide you with the care you need, when you need it.  Your next appointment:   10/25/2019 8:15  The format for your next appointment:   Virtual Visit   Provider:   Melina Copa, PA-C  Other Instructions YOUR CARDIOLOGY TEAM HAS ARRANGED FOR AN E-VISIT FOR YOUR APPOINTMENT - PLEASE REVIEW IMPORTANT INFORMATION BELOW SEVERAL DAYS PRIOR TO YOUR APPOINTMENT  Due to the recent COVID-19 pandemic, we are transitioning in-person office visits to tele-medicine visits in an effort to decrease unnecessary exposure to our patients, their families, and staff. These visits are billed to your insurance just like a normal visit is. We also encourage you to sign up for MyChart if you have not already done so. You will need a  smartphone if possible. For patients that do not have this, we can still complete the visit using a regular telephone but do prefer a smartphone to enable video when possible. You may have a family member that lives with you that can help. If possible, we also ask that you have a blood pressure cuff and scale at home to measure your blood pressure, heart rate and weight prior to your scheduled appointment. Patients with clinical needs that need an in-person evaluation and testing will still be able to come to the office if absolutely necessary. If you have any questions, feel free to call our office.     YOUR PROVIDER WILL BE USING THE FOLLOWING PLATFORM TO COMPLETE YOUR VISIT: [email or web address]  . [email or web address].ME - The staff will give you instructions on receiving your link to join the meeting the day of your visit.    THE DAY OF YOUR APPOINTMENT  Approximately 15 minutes prior to your scheduled appointment, you will receive a telephone call from one of [place] team - your caller ID may say "Unknown caller."  Our staff will confirm medications, vital signs for the day and any symptoms you may be experiencing. Please have this information available prior to the time of visit start. It may also be helpful for you to have a pad of paper and pen handy for any instructions given during your visit. They will also walk you through joining the smartphone meeting if this is a video visit.    CONSENT FOR TELE-HEALTH VISIT -  PLEASE REVIEW  I hereby voluntarily request, consent and authorize [facility] and its employed or contracted physicians, physician assistants, nurse practitioners or other licensed health care professionals (the Practitioner), to provide me with telemedicine health care services (the "Services") as deemed necessary by the treating Practitioner. I acknowledge and consent to receive the Services by the Practitioner via telemedicine. I understand that the telemedicine visit will involve communicating  with the Practitioner through live audiovisual communication technology and the disclosure of certain medical information by electronic transmission. I acknowledge that I have been given the opportunity to request an in-person assessment or other available alternative prior to the telemedicine visit and am voluntarily participating in the telemedicine visit.  I understand that I have the right to withhold or withdraw my consent to the use of telemedicine in the course of my care at any time, without affecting my right to future care or treatment, and that the Practitioner or I may terminate the telemedicine visit at any time. I understand that I have the right to inspect all information obtained and/or recorded in the course of the telemedicine visit and may receive copies of available information for a reasonable fee.  I understand that some of the potential risks of receiving the Services via telemedicine include:  Marland Kitchen Delay or interruption in medical evaluation due to technological equipment failure or disruption; . Information transmitted may not be sufficient (e.g. poor resolution of images) to allow for appropriate medical decision making by the Practitioner; and/or  . In rare instances, security protocols could fail, causing a breach of personal health information.  Furthermore, I acknowledge that it is my responsibility to provide information about my medical history, conditions and care that is complete and accurate to the best of my ability. I acknowledge that Practitioner's advice, recommendations, and/or decision may be based on factors not within their control, such as incomplete or inaccurate data provided by me or distortions of diagnostic images or specimens that may result from electronic transmissions. I understand that the practice of medicine is not an exact science and that Practitioner makes no warranties or guarantees regarding treatment outcomes. I acknowledge that I will receive a copy  of this consent concurrently upon execution via email to the email address I last provided but may also request a printed copy by calling the office of [place].    I understand that my insurance will be billed for this visit.   I have read or had this consent read to me. . I understand the contents of this consent, which adequately explains the benefits and risks of the Services being provided via telemedicine.  . I have been provided ample opportunity to ask questions regarding this consent and the Services and have had my questions answered to my satisfaction. . I give my informed consent for the services to be provided through the use of telemedicine in my medical care  By participating in this telemedicine visit I agree to the above.

## 2019-10-22 ENCOUNTER — Other Ambulatory Visit: Payer: POS | Admitting: *Deleted

## 2019-10-22 ENCOUNTER — Other Ambulatory Visit: Payer: Self-pay

## 2019-10-22 DIAGNOSIS — I447 Left bundle-branch block, unspecified: Secondary | ICD-10-CM

## 2019-10-22 DIAGNOSIS — I428 Other cardiomyopathies: Secondary | ICD-10-CM

## 2019-10-22 DIAGNOSIS — I10 Essential (primary) hypertension: Secondary | ICD-10-CM

## 2019-10-22 DIAGNOSIS — I5042 Chronic combined systolic (congestive) and diastolic (congestive) heart failure: Secondary | ICD-10-CM

## 2019-10-22 DIAGNOSIS — E785 Hyperlipidemia, unspecified: Secondary | ICD-10-CM

## 2019-10-22 NOTE — Progress Notes (Signed)
Virtual Visit via Telephone Note   This visit type was conducted due to national recommendations for restrictions regarding the COVID-19 Pandemic (e.g. social distancing) in an effort to limit this patient's exposure and mitigate transmission in our community.  Due to her co-morbid illnesses, this patient is at least at moderate risk for complications without adequate follow up.  This format is felt to be most appropriate for this patient at this time.  The patient did not have access to video technology/had technical difficulties with video requiring transitioning to audio format only (telephone).  All issues noted in this document were discussed and addressed.  No physical exam could be performed with this format.  The patient consented to telehealth visit with [facility]. Virtual platform was offered given ongoing worsening Covid-19 pandemic.  Date:  10/25/2019   ID:  Aaila Grisso, DOB 05-10-1961, MRN [id number]  Patient Location: Home Provider Location: [facility]  PCP:  Kathyrn Lass, MD  Cardiologist:  Fransico Him, MD  Electrophysiologist:  None   Evaluation Performed:  Follow-Up Visit  Chief Complaint:  F/u Entresto titration  History of Present Illness:    Carylon Rosenberg is a 59 y.o. 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  She was previously followed by Dr. Wynonia Lawman then established care with Dr. Radford Pax in 02/2019. Per patient report she was found to have low EF in 2006 and they did a heart cath around that time, as well as one a few years later without any findings of coronary obstruction. She was also found to be hypertensive at that time around 150/90s. She had no history of heavy ETOH or illicit drug use. No family  history of cardiomyopathy. She reported a significant viral illness in 2001 but this was years before diagnosis of CHF. Records are not on file. She also recalls development of asthma around that time. She does recall being told at one point her EF had improved somewhat but that was years ago. F/u echo 03/2019 showed EF 35-40%, moderate LVH, pseudonormalization, LBBB, and normal RVSP. Earlier this year she was changed from [facility]I to [facility]. She was also previously on hydralazine but did not tolerate nighttime dosing due to tinnitus.   She presented back to clinic 07/13/19 for evaluation of cough. She had recalled being told in the past to call if she had any weight gain. She had not been weighing recently but stepped on the scale and noticed a 4-5lb weight gain from prior values in the setting of less activity during the pandemic. Her weight has tended to fluctuate over the last several years. She also reported increased wheezing. She had self-reported sockline edema but examination was unrevealing for signs of fluid overload. She had no orthopnea or PND. Her BNP was completely normal. She also reported exposure to Covid-19 positive coworkers so was sent for a Covid test which was negative. She was encouraged to reach out to primary care for further evaluation. CXR showed mild cardiomegaly but no acute abnormalities or fluid. She was asked to see her PCP for management of asthma and was placed on Flovent which helped her wheezing significantly. As part of her cardiac workup for NICM, cardiac MRI was done 08/09/19 and showed moderate LVE with diffuse hypokinesis, EF 43%, no infarct/scar and no evidence of amyloidosis on delayed  inversion recovery sequences, mild MR, mild LAE, normal RV size and function. At last visit on 10/07/19, given clinical stability, we opted to titrate her Entresto further. Last labs personally reviewed include 10/22/19 showed K 4.3, Cr 0.95, calcium 10.4, 09/2019 normal LFTs, LDL 90, 06/2019  TSH and CBC wnl.   She is seen back virtually for follow-up today feeling well. She is tolerating the Entresto dose increase without consequence and in fact feeling a little more energized than before. No acute complaints of CP, SOB, edema, orthopnea, PND. She has not been tracking BP the last few days. She checked it today right out of waking up and it was elevated as below. She has not yet taken her Entresto for the day. She does note the cost is much higher. She was previously utilizing $10 copay discount.   Past Medical History:  Diagnosis Date  . Asthma   . Chronic combined systolic and diastolic CHF (congestive heart failure) ([place])   . Diabetes mellitus without complication ([place])   . Hypercalcemia   . Hyperlipidemia   . Hypertension   . LBBB (left bundle branch block)   . LVH (left ventricular hypertrophy)   . NICM (nonischemic cardiomyopathy) ([place])   . OSA (obstructive sleep apnea)   . Prediabetes   . Sleep apnea    Past Surgical History:  Procedure Laterality Date  . ACHILLES TENDON SURGERY     right  . CESAREAN SECTION     x2  . KNEE ARTHROSCOPY WITH MENISCAL REPAIR     right knee  . OVARIAN CYST REMOVAL     right     Current Meds  Medication Sig  . albuterol (PROVENTIL HFA;VENTOLIN HFA) 108 (90 Base) MCG/ACT inhaler Inhale into the lungs every 6 (six) hours as needed for wheezing or shortness of breath.  Marland Kitchen atorvastatin (LIPITOR) 10 MG tablet Take 10 mg by mouth daily.  . cholecalciferol (VITAMIN D) 1000 units tablet Take 5,000 Units by mouth daily.  . Fluticasone Propionate, Inhal, (FLOVENT IN) Inhale 110 mcg into the lungs 2 (two) times daily.   . furosemide (LASIX) 20 MG tablet Take 1 tablet (20 mg total) by mouth daily.  Marland Kitchen LEVOXYL 88 MCG tablet Take 1 tablet by mouth daily.  . meclizine (ANTIVERT) 25 MG tablet Take 1 tablet (25 mg total) by mouth 3 (three) times daily as needed for dizziness.  . metoprolol succinate (TOPROL-XL) 25 MG 24 hr tablet Take 1 tablet  (25 mg total) by mouth daily.  . sacubitril-valsartan (ENTRESTO) 49-51 MG Take 1 tablet by mouth 2 (two) times daily.  Marland Kitchen spironolactone (ALDACTONE) 25 MG tablet Take 1 tablet (25 mg total) by mouth daily.  Marland Kitchen tiZANidine (ZANAFLEX) 4 MG tablet Take 4 mg by mouth as needed. As needed for muscle spasms     Allergies:   Patient has no known allergies.   Social History   Tobacco Use  . Smoking status: Never Smoker  . Smokeless tobacco: Never Used  Substance Use Topics  . Alcohol use: Yes    Alcohol/week: 1.0 standard drinks    Types: 1 Glasses of wine per week    Comment: 1-2 times a month  . Drug use: No     Family Hx: The patient's family history includes Stroke in her mother.  ROS:   Please see the history of present illness.    All other systems reviewed and are negative.   Prior CV studies:    Most recent pertinent cardiac studies are outlined and  summarized above. Reports included below if pertinent.  Cardiac MRI 07/2019 CLINICAL DATA:  Cardiomyopathy EXAM: CARDIAC MRI  TECHNIQUE: The patient was scanned on a 1.5 Tesla GE magnet. A dedicated cardiac coil was used. Functional imaging was done using Fiesta sequences. 2,3, and 4 chamber views were done to assess for RWMA's. Modified Simpson's rule using a short axis stack was used to calculate an ejection fraction on a dedicated work Conservation officer, nature. The patient received 12 cc of Multihance. After 10 minutes inversion recovery sequences were used to assess for infiltration and scar tissue.  CONTRAST:  Gadavist FINDINGS: Mild LAE. Normal RA/RV. No ASD/PFO. Normal aortic root 2.9 cm. No pericardial effusion Normal TV, AV and MV. Mild MR. Normal RV size and function. Moderate basal septal hypertrophy 14 mm compared to posterior wall 9 mm. No SAM or LVOT turbulence. Moderate LVE with diffuse hypokinesis and abnormal septal motion. Quantitative EF is 43% (EDV 174 cc ESV 100 cc SV 74 cc) No infarct,  infiltration or evidence of amyloid on delayed enhancement images with gadolinium  IMPRESSION: 1. Moderate LVE with diffuse hypokinesis and abnormal septal motion EF 43% 2. No infarct/scar and no evidence of amyloidosis on delayed inversion recovery sequences with gadolinium 3.  Mild MR 4.  Mild LAE 5.  Normal RV size and function  Jenkins Rouge   Electronically Signed   By: Jenkins Rouge M.D.   On: 08/09/2019 14:28  2D echo 03/2019 1. The left ventricle has moderately reduced systolic function, with an  ejection fraction of 35-40%. The cavity size was normal. There is  moderately increased left ventricular wall thickness. Left ventricular  diastolic Doppler parameters are consistent  with pseudonormalization. Elevated left ventricular end-diastolic pressure  The E/e' is 17.7. There is abnormal septal motion consistent with left  bundle branch block.  2. The right ventricle has normal systolic function. The cavity was  normal. There is no increase in right ventricular wall thickness. Right  ventricular systolic pressure is normal with an estimated pressure of 19.5  mmHg.  3. The aortic valve is tricuspid. No stenosis of the aortic valve.     Labs/Other Tests and Data Reviewed:    EKG:  An ECG dated 07/13/19 was personally reviewed today and demonstrated:  NSR 68bpm, biatrial enlargement, right axis deviation, LBBB  Recent Labs: 07/13/2019: Hemoglobin 14.1; NT-Pro BNP 99; Platelets 315; TSH 3.180 10/07/2019: ALT 22 10/22/2019: BUN 23; Creatinine, Ser 0.95; Potassium 4.3; Sodium 141   Recent Lipid Panel Lab Results  Component Value Date/Time   CHOL 149 10/07/2019 08:47 AM   TRIG 62 10/07/2019 08:47 AM   HDL 46 10/07/2019 08:47 AM   CHOLHDL 3.2 10/07/2019 08:47 AM   LDLCALC 90 10/07/2019 08:47 AM    Wt Readings from Last 3 Encounters:  10/25/19 199 lb (90.3 kg)  10/07/19 202 lb (91.6 kg)  08/10/19 193 lb (87.5 kg)     Objective:    Vital Signs:  BP (!)  144/85   Pulse 66   Ht 5\' 2"  (1.575 m)   Wt 199 lb (90.3 kg)   BMI 36.40 kg/m    VS reviewed. General - calm F in no acute distress Pulm - No labored breathing, no audible wheezing, speaking in full sentences Neuro - A+Ox3, no slurred speech, answers questions appropriately Psych - Pleasant affect     ASSESSMENT & PLAN:    1. Chronic combined CHF with medication management/financial barrier - tolerating Entresto increase well with improved energy. Her BP  is mildly elevated but she has not yet taken meds for the day. I will have her check her BP at noon daily these next 3 days and relay readings on Wednesday. May increase dose further if BP is suboptimally controlled. It seems her copay card has lapsed and she had to pay $100 for 30 days. This might be cost prohibitive for her so we will relay her information to our patient assistance coordinator to reach out. She also notes the rx was sent to [facility] (where majority of her medications have gone in the past) but she traditionally fills the [place] at [facility]. As a result, they have been calling her on their autocall program to remind her to refill (the lower dose). I will have our nurse call [facility] to tell them to stop calling her as a further dose change may be forthcoming on Wednesday. We will plan to send in a refill at that time.  2. NICM - weight generally in line with prior weights. Infiltrative cardiomyopathy has been ruled out. Continue guideline directed management. 3. Essential HTN - follow blood pressure readings. She will update [place] with numbers on Wednesday for review/decision making about further dose changes. 4. Hypercalcemia - has been persistently noted on labs since 2018 so does not appear acutely related to HF medication titration. Recommended she avoid any extraneous calcium supplementation (not currently on any) and follow up with PCP for further evaluation, if not already done.  Time:   Today, I have spent 14  minutes with the patient with telehealth technology discussing the above problems. Additional 6 minutes in chart prep and reviewing data.   Medication Adjustments/Labs and Tests Ordered: Current medicines are reviewed at length with the patient today.  Concerns regarding medicines are outlined above.   Follow Up:  With Dr. Radford Pax in 4 months.   Signed, Charlie Pitter, PA-C  10/25/2019 8:42 AM    [place]

## 2019-10-23 LAB — BASIC METABOLIC PANEL
BUN/Creatinine Ratio: 24 — ABNORMAL HIGH (ref 9–23)
BUN: 23 mg/dL (ref 6–24)
CO2: 24 mmol/L (ref 20–29)
Calcium: 10.4 mg/dL — ABNORMAL HIGH (ref 8.7–10.2)
Chloride: 103 mmol/L (ref 96–106)
Creatinine, Ser: 0.95 mg/dL (ref 0.57–1.00)
GFR calc Af Amer: 77 mL/min/{1.73_m2} (ref >59)
GFR calc non Af Amer: 67 mL/min/{1.73_m2} (ref >59)
Glucose: 87 mg/dL (ref 65–99)
Potassium: 4.3 mmol/L (ref 3.5–5.2)
Sodium: 141 mmol/L (ref 134–144)

## 2019-10-25 ENCOUNTER — Telehealth: Payer: Self-pay

## 2019-10-25 ENCOUNTER — Telehealth (INDEPENDENT_AMBULATORY_CARE_PROVIDER_SITE_OTHER): Payer: POS | Admitting: Physician Assistant

## 2019-10-25 ENCOUNTER — Encounter: Payer: Self-pay | Admitting: Physician Assistant

## 2019-10-25 ENCOUNTER — Other Ambulatory Visit: Payer: Self-pay

## 2019-10-25 ENCOUNTER — Encounter: Payer: Self-pay | Admitting: *Deleted

## 2019-10-25 VITALS — BP 144/85 | HR 66 | Ht 62.0 in | Wt 199.0 lb

## 2019-10-25 DIAGNOSIS — I5042 Chronic combined systolic (congestive) and diastolic (congestive) heart failure: Secondary | ICD-10-CM

## 2019-10-25 DIAGNOSIS — I428 Other cardiomyopathies: Secondary | ICD-10-CM

## 2019-10-25 DIAGNOSIS — I10 Essential (primary) hypertension: Secondary | ICD-10-CM

## 2019-10-25 NOTE — Patient Instructions (Signed)
Medication Instructions:  Your physician recommends that you continue on your current medications as directed. Please refer to the Current Medication list given to you today.   *If you need a refill on your cardiac medications before your next appointment, please call your pharmacy*  Lab Work: None ordered  If you have labs (blood work) drawn today and your tests are completely normal, you will receive your results only by: Marland Kitchen MyChart Message (if you have MyChart) OR . A paper copy in the mail If you have any lab test that is abnormal or we need to change your treatment, we will call you to review the results.  Testing/Procedures: None ordered  Follow-Up: At [facility], you and your health needs are our priority.  As part of our continuing mission to provide you with exceptional heart care, we have created designated Provider Care Teams.  These Care Teams include your primary Cardiologist (physician) and Advanced Practice Providers (APPs -  Physician Assistants and Nurse Practitioners) who all work together to provide you with the care you need, when you need it.  Your next appointment:   4 month(s)  The format for your next appointment:   In Person  Provider:   You may see Fransico Him, MD or one of the following Advanced Practice Providers on your designated Care Team:    Melina Copa, PA-C  Ermalinda Barrios, PA-C   Other Instructions Please check your blood pressure daily at noon and send [place] a copy of those readings on Wednesday afternoon via MyChart or telephone call.   Please follow up with your primary care provider for your persistent mildly elevated calcium level (present since at least 2018

## 2019-10-28 NOTE — Telephone Encounter (Signed)
Please let pt know BPs look better than the in office value we had seen. Would have her continue current dose of Entresto, OK to refill to [facility] if she wants. Would continue to monitor BP regularly and if she finds that she is seeing readings greater than 130 let us know. Also please see if any updates re: pt assistance from [place].  Tai Syfert PA-C

## 2019-12-13 ENCOUNTER — Ambulatory Visit (INDEPENDENT_AMBULATORY_CARE_PROVIDER_SITE_OTHER): Payer: POS | Admitting: Family Medicine

## 2019-12-13 ENCOUNTER — Encounter (INDEPENDENT_AMBULATORY_CARE_PROVIDER_SITE_OTHER): Payer: Self-pay | Admitting: Family Medicine

## 2019-12-13 ENCOUNTER — Other Ambulatory Visit: Payer: Self-pay

## 2019-12-13 ENCOUNTER — Other Ambulatory Visit: Payer: Self-pay | Admitting: Family Medicine

## 2019-12-13 ENCOUNTER — Ambulatory Visit
Admission: RE | Admit: 2019-12-13 | Discharge: 2019-12-13 | Disposition: A | Discharge status: Home / Self Care | Payer: POS | Source: Ambulatory Visit | Attending: Family Medicine | Admitting: Family Medicine

## 2019-12-13 VITALS — BP 149/81 | HR 66 | Temp 98.1°F | Ht 63.0 in | Wt 198.0 lb

## 2019-12-13 DIAGNOSIS — R5383 Other fatigue: Secondary | ICD-10-CM

## 2019-12-13 DIAGNOSIS — E038 Other specified hypothyroidism: Secondary | ICD-10-CM | POA: Diagnosis not present

## 2019-12-13 DIAGNOSIS — E559 Vitamin D deficiency, unspecified: Secondary | ICD-10-CM

## 2019-12-13 DIAGNOSIS — Z1331 Encounter for screening for depression: Secondary | ICD-10-CM | POA: Diagnosis not present

## 2019-12-13 DIAGNOSIS — R0602 Shortness of breath: Secondary | ICD-10-CM

## 2019-12-13 DIAGNOSIS — Z1231 Encounter for screening mammogram for malignant neoplasm of breast: Secondary | ICD-10-CM

## 2019-12-13 DIAGNOSIS — R7303 Prediabetes: Secondary | ICD-10-CM | POA: Diagnosis not present

## 2019-12-13 DIAGNOSIS — Z0289 Encounter for other administrative examinations: Secondary | ICD-10-CM

## 2019-12-13 DIAGNOSIS — Z6835 Body mass index (BMI) 35.0-35.9, adult: Secondary | ICD-10-CM

## 2019-12-13 DIAGNOSIS — G4733 Obstructive sleep apnea (adult) (pediatric): Secondary | ICD-10-CM

## 2019-12-14 LAB — COMPREHENSIVE METABOLIC PANEL
ALT: 18 IU/L (ref 0–32)
AST: 15 IU/L (ref 0–40)
Albumin/Globulin Ratio: 1.6 (ref 1.2–2.2)
Albumin: 4.5 g/dL (ref 3.8–4.9)
Alkaline Phosphatase: 96 IU/L (ref 39–117)
BUN/Creatinine Ratio: 21 (ref 9–23)
BUN: 20 mg/dL (ref 6–24)
Bilirubin Total: 0.6 mg/dL (ref 0.0–1.2)
CO2: 22 mmol/L (ref 20–29)
Calcium: 10 mg/dL (ref 8.7–10.2)
Chloride: 106 mmol/L (ref 96–106)
Creatinine, Ser: 0.96 mg/dL (ref 0.57–1.00)
GFR calc Af Amer: 76 mL/min/{1.73_m2} (ref >59)
GFR calc non Af Amer: 66 mL/min/{1.73_m2} (ref >59)
Globulin, Total: 2.8 g/dL (ref 1.5–4.5)
Glucose: 113 mg/dL — ABNORMAL HIGH (ref 65–99)
Potassium: 4.6 mmol/L (ref 3.5–5.2)
Sodium: 141 mmol/L (ref 134–144)
Total Protein: 7.3 g/dL (ref 6.0–8.5)

## 2019-12-14 LAB — CBC WITH DIFFERENTIAL/PLATELET
Basophils Absolute: 0.1 10*3/uL (ref 0.0–0.2)
Basos: 1 %
EOS (ABSOLUTE): 0.3 10*3/uL (ref 0.0–0.4)
Eos: 7 %
Hematocrit: 42.4 % (ref 34.0–46.6)
Hemoglobin: 13.6 g/dL (ref 11.1–15.9)
Immature Grans (Abs): 0 10*3/uL (ref 0.0–0.1)
Immature Granulocytes: 0 %
Lymphocytes Absolute: 1.6 10*3/uL (ref 0.7–3.1)
Lymphs: 33 %
MCH: 27.9 pg (ref 26.6–33.0)
MCHC: 32.1 g/dL (ref 31.5–35.7)
MCV: 87 fL (ref 79–97)
Monocytes Absolute: 0.3 10*3/uL (ref 0.1–0.9)
Monocytes: 5 %
Neutrophils Absolute: 2.7 10*3/uL (ref 1.4–7.0)
Neutrophils: 54 %
Platelets: 296 10*3/uL (ref 150–450)
RBC: 4.88 x10E6/uL (ref 3.77–5.28)
RDW: 12.9 % (ref 11.7–15.4)
WBC: 5 10*3/uL (ref 3.4–10.8)

## 2019-12-14 LAB — FOLATE: Folate: 6.5 ng/mL (ref >3.0)

## 2019-12-14 LAB — INSULIN, RANDOM: INSULIN: 25.6 u[IU]/mL — ABNORMAL HIGH (ref 2.6–24.9)

## 2019-12-14 LAB — T4, FREE: Free T4: 1.25 ng/dL (ref 0.82–1.77)

## 2019-12-14 LAB — T3: T3, Total: 95 ng/dL (ref 71–180)

## 2019-12-14 LAB — VITAMIN B12: Vitamin B-12: 252 pg/mL (ref 232–1245)

## 2019-12-14 LAB — HEMOGLOBIN A1C
Est. average glucose Bld gHb Est-mCnc: 143 mg/dL
Hgb A1c MFr Bld: 6.6 % — ABNORMAL HIGH (ref 4.8–5.6)

## 2019-12-14 LAB — VITAMIN D 25 HYDROXY (VIT D DEFICIENCY, FRACTURES): Vit D, 25-Hydroxy: 65.1 ng/mL (ref 30.0–100.0)

## 2019-12-14 LAB — TSH: TSH: 1.79 u[IU]/mL (ref 0.450–4.500)

## 2019-12-14 NOTE — Progress Notes (Signed)
Dear Melina Copa, PA,   Thank you for referring Lindsay Hayden to our clinic. The following note includes my evaluation and treatment recommendations.  Chief Complaint:   OBESITY Lindsay Hayden (MR# [id number]) is a 59 y.o. female who presents for evaluation and treatment of obesity and related comorbidities. Current BMI is Body mass index is 35.07 kg/m.Lindsay Hayden has been struggling with her weight for many years and has been unsuccessful in either losing weight, maintaining weight loss, or reaching her healthy weight goal.  Lindsay Hayden is currently in the action stage of change and ready to dedicate time achieving and maintaining a healthier weight. Lindsay Hayden is interested in becoming our patient and working on intensive lifestyle modifications including (but not limited to) diet and exercise for weight loss.  Lindsay Hayden eats out 3-4 times a week in the evening. At approximately 8:30 a.m. she has a cup of oatmeal and black or green tea. At 10:30 a.m. she has tea and a snack of peanut butter crackers. At 12:30 p.m. she has leftovers or beef patty, tangerines, and a bottle of water (feels full). Between 5-7 p.m. she has 5-6 oz baked chicken, 1 cup of rice, and 1/2 cup of veggies (feels full).  Laquenta's habits were reviewed today and are as follows: Her family eats meals together, her desired weight loss is 48-58 lbs, she has been heavy since giving birth, she started gaining weight after the birth of her second child, her heaviest weight ever was 200 pounds, she craves bread and pasta, she skips lunch sometimes on days off 1-2 times a week, she sometimes eats larger portions than normal, she has binge eating behaviors and she struggles with emotional eating.  Depression Screen Lindsay Hayden's Food and Mood (modified PHQ-9) score was 4.  Depression screen Lindsay Hayden 2/9 12/13/2019  Decreased Interest 1  Down, Depressed, Hopeless 0  PHQ - 2 Score 1  Altered sleeping 0  Tired, decreased  energy 2  Change in appetite 0  Feeling bad or failure about yourself  0  Trouble concentrating 0  Moving slowly or fidgety/restless 1  Suicidal thoughts 0  PHQ-9 Score 4  Difficult doing work/chores Somewhat difficult   Subjective:   Other fatigue. Yudi denies daytime somnolence and denies waking up still tired. Katalaya generally gets 5-7 hours of sleep per night, and states that she has generally restful sleep. Snoring is present. Apneic episodes are present. Epworth Sleepiness Score is 4.  Shortness of breath on exertion. Leidi notes increasing shortness of breath with exercising and seems to be worsening over time with weight gain. She notes getting out of breath sooner with activity than she used to. This has gotten worse recently. [place] denies shortness of breath at rest or orthopnea.  Prediabetes. Lindsay Hayden has a diagnosis of prediabetes based on her elevated HgA1c and was informed this puts her at greater risk of developing diabetes. She continues to work on diet and exercise to decrease her risk of diabetes. She denies nausea or hypoglycemia. Prediabetes was diagnosed ~1 year ago. No HgA1c on file.  Other specified hypothyroidism. Lindsay Hayden is on levothyroxine 88 mcg. No cold intolerance, heat intolerance, or palpitations.   Vitamin D deficiency. No nausea, vomiting, or muscle weakness. She endorses fatigue. Lindsay Hayden is on Vitamin D 5,000 IU daily.  OSA (obstructive sleep apnea). Lindsay Hayden has CPAP. She occasionally very rarely doesn't wear CPAP. She has a sleep provider.  Depression screening. Genese had a negative depression screen with a PHQ-9 score of 4.  Assessment/Plan:  Other fatigue. Shaquonna does feel that her weight is causing her energy to be lower than it should be. Fatigue may be related to obesity, depression or many other causes. Labs will be ordered, and in the meanwhile, [place] will focus on self care including making healthy  food choices, increasing physical activity and focusing on stress reduction. EKG 12-Lead showed left bundle branch block. CBC with Differential/Platelet, Folate, Vitamin B12 labs ordered.  Shortness of breath on exertion. Kyah does feel that she gets out of breath more easily that she used to when she exercises. Tonie's shortness of breath appears to be obesity related and exercise induced. She has agreed to work on weight loss and gradually increase exercise to treat her exercise induced shortness of breath. Will continue to monitor closely.  Prediabetes. Avaleigh will continue to work on weight loss, exercise, and decreasing simple carbohydrates to help decrease the risk of diabetes. Comprehensive metabolic panel, Hemoglobin A1c, Insulin, random labs ordered.  Other specified hypothyroidism. Patient with long-standing hypothyroidism, on levothyroxine therapy. She appears euthyroid. Orders and follow up as documented in patient record. T3, T4, free, TSH levels ordered.  Counseling . Good thyroid control is important for overall health. Supratherapeutic thyroid levels are dangerous and will not improve weight loss results. . The correct way to take levothyroxine is fasting, with water, separated by at least 30 minutes from breakfast, and separated by more than 4 hours from calcium, iron, multivitamins, acid reflux medications (PPIs).     Vitamin D deficiency. Low Vitamin D level contributes to fatigue and are associated with obesity, breast, and colon cancer. VITAMIN D 25 Hydroxy (Vit-D Deficiency, Fractures) level ordered today.  OSA (obstructive sleep apnea). Intensive lifestyle modifications are the first line treatment for this issue. We discussed several lifestyle modifications today and she will continue to work on diet, exercise and weight loss efforts. We will continue to monitor. Orders and follow up as documented in patient record. Lindsay Hayden will follow-up with her sleep  provider as needed.  Counseling  Sleep apnea is a condition in which breathing pauses or becomes shallow during sleep. This happens over and over during the night. This disrupts your sleep and keeps your body from getting the rest that it needs, which can cause tiredness and lack of energy (fatigue) during the day.  Sleep apnea treatment: If you were given a device to open your airway while you sleep, USE IT!  Sleep hygiene:   Limit or avoid alcohol, caffeinated beverages, and cigarettes, especially close to bedtime.   Do not eat a large meal or eat spicy foods right before bedtime. This can lead to digestive discomfort that can make it hard for you to sleep.  Keep a sleep diary to help you and your health care provider figure out what could be causing your insomnia.  . Make your bedroom a dark, comfortable place where it is easy to fall asleep. ? Put up shades or blackout curtains to block light from outside. ? Use a white noise machine to block noise. ? Keep the temperature cool. . Limit screen use before bedtime. This includes: ? Watching TV. ? Using your smartphone, tablet, or computer. . Stick to a routine that includes going to bed and waking up at the same times every day and night. This can help you fall asleep faster. Consider making a quiet activity, such as reading, part of your nighttime routine. . Try to avoid taking naps during the day so that you sleep better at night. Marland Kitchen  Get out of bed if you are still awake after 15 minutes of trying to sleep. Keep the lights down, but try reading or doing a quiet activity. When you feel sleepy, go back to bed.  Depression screening. Lindsay Hayden had a negative depression screening.  Class 2 severe obesity with serious comorbidity and body mass index (BMI) of 35.0 to 35.9 in adult, unspecified obesity type ([place]).  Lindsay Hayden is currently in the action stage of change and her goal is to continue with weight loss efforts. I recommend  Lindsay Hayden begin the structured treatment plan as follows:  She has agreed to the Category 1 Plan + 100 calories.  Exercise goals: No exercise has been prescribed at this time.   Behavioral modification strategies: increasing lean protein intake, increasing vegetables, meal planning and cooking strategies, keeping healthy foods in the home and planning for success.  She was informed of the importance of frequent follow-up visits to maximize her success with intensive lifestyle modifications for her multiple health conditions. She was informed we would discuss her lab results at her next visit unless there is a critical issue that needs to be addressed sooner. Lindsay Hayden agreed to keep her next visit at the agreed upon time to discuss these results.  Objective:   Blood pressure (!) 149/81, pulse 66, temperature 98.1 F (36.7 C), temperature source Oral, height 5\' 3"  (1.6 m), weight 198 lb (89.8 kg), last menstrual period 07/16/2017, SpO2 95 %. Body mass index is 35.07 kg/m.  EKG: Sinus  Rhythm with a rate of 63 BPM. Left bundle branch block and right axis. Abnormal.  Indirect Calorimeter completed today shows a VO2 of 180 and a REE of 1255.  Her calculated basal metabolic rate is [id number] thus her basal metabolic rate is worse than expected.  General: Cooperative, alert, well developed, in no acute distress. HEENT: Conjunctivae and lids unremarkable. Cardiovascular: Regular rhythm.  Lungs: Normal work of breathing. Neurologic: No focal deficits.   Lab Results  Component Value Date   CREATININE 0.95 10/22/2019   BUN 23 10/22/2019   NA 141 10/22/2019   K 4.3 10/22/2019   CL 103 10/22/2019   CO2 24 10/22/2019   Lab Results  Component Value Date   ALT 22 10/07/2019   AST 21 10/07/2019   ALKPHOS 100 10/07/2019   BILITOT 0.7 10/07/2019   No results found for: HGBA1C No results found for: INSULIN Lab Results  Component Value Date   TSH 3.180 07/13/2019   Lab Results  Component  Value Date   CHOL 149 10/07/2019   HDL 46 10/07/2019   LDLCALC 90 10/07/2019   TRIG 62 10/07/2019   CHOLHDL 3.2 10/07/2019   Lab Results  Component Value Date   WBC 5.0 07/13/2019   HGB 14.1 07/13/2019   HCT 42.1 07/13/2019   MCV 85 07/13/2019   PLT 315 07/13/2019   No results found for: IRON, TIBC, FERRITIN  Attestation Statements:   This is the patient's first visit at [facility]. The patient's NEW PATIENT PACKET was reviewed at length. Included in the packet: current and past health history, medications, allergies, ROS, gynecologic history (women only), surgical history, family history, social history, weight history, weight loss surgery history (for those that have had weight loss surgery), nutritional evaluation, mood and food questionnaire, PHQ9, Epworth questionnaire, sleep habits questionnaire, patient life and health improvement goals questionnaire. These will all be scanned into the patient's chart under media.   During the visit, I independently reviewed the patient's EKG,  bioimpedance scale results, and indirect calorimeter results. I used this information to tailor a meal plan for the patient that will help her to lose weight and will improve her obesity-related conditions going forward. I performed a medically necessary appropriate examination and/or evaluation. I discussed the assessment and treatment plan with the patient. The patient was provided an opportunity to ask questions and all were answered. The patient agreed with the plan and demonstrated an understanding of the instructions. Labs were ordered at this visit and will be reviewed at the next visit unless more critical results need to be addressed immediately. Clinical information was updated and documented in the EMR.   Time spent on visit including pre-visit chart review and post-visit care was 60 minutes.   A separate 15 minutes was spent on risk counseling (see above).   I, Michaelene Song, am  acting as transcriptionist for Coralie Common, MD   I have reviewed the above documentation for accuracy and completeness, and I agree with the above. - Ilene Qua, MD

## 2019-12-27 ENCOUNTER — Ambulatory Visit (INDEPENDENT_AMBULATORY_CARE_PROVIDER_SITE_OTHER): Payer: POS | Admitting: Family Medicine

## 2019-12-27 ENCOUNTER — Ambulatory Visit: Payer: POS | Attending: Internal Medicine

## 2019-12-27 ENCOUNTER — Other Ambulatory Visit: Payer: Self-pay

## 2019-12-27 ENCOUNTER — Encounter (INDEPENDENT_AMBULATORY_CARE_PROVIDER_SITE_OTHER): Payer: Self-pay | Admitting: Family Medicine

## 2019-12-27 VITALS — BP 131/78 | HR 59 | Temp 98.1°F | Ht 63.0 in | Wt 193.0 lb

## 2019-12-27 DIAGNOSIS — E669 Obesity, unspecified: Secondary | ICD-10-CM

## 2019-12-27 DIAGNOSIS — E7849 Other hyperlipidemia: Secondary | ICD-10-CM

## 2019-12-27 DIAGNOSIS — E559 Vitamin D deficiency, unspecified: Secondary | ICD-10-CM

## 2019-12-27 DIAGNOSIS — E1165 Type 2 diabetes mellitus with hyperglycemia: Secondary | ICD-10-CM

## 2019-12-27 DIAGNOSIS — Z6834 Body mass index (BMI) 34.0-34.9, adult: Secondary | ICD-10-CM

## 2019-12-27 DIAGNOSIS — Z23 Encounter for immunization: Secondary | ICD-10-CM

## 2019-12-27 NOTE — Progress Notes (Signed)
   Covid-19 Vaccination Clinic  Name:  Lindsay Hayden    MRN: [id number] DOB: 12/23/1960  12/27/2019  [name] was observed post Covid-19 immunization for 15 minutes without incident. She was provided with Vaccine Information Sheet and instruction to access the V-Safe system.   [name] was instructed to call 911 with any severe reactions post vaccine: Marland Kitchen Difficulty breathing  . Swelling of face and throat  . A fast heartbeat  . A bad rash all over body  . Dizziness and weakness   Immunizations Administered    Name Date Dose VIS Date Route   Pfizer COVID-19 Vaccine 12/27/2019  9:21 AM 0.3 mL 08/27/2019 Intramuscular   Manufacturer: [place]   Lot: [id number]   [place]: [id number]

## 2019-12-28 NOTE — Progress Notes (Signed)
Chief Complaint:   OBESITY Lindsay Hayden is here to discuss her progress with her obesity treatment plan along with follow-up of her obesity related diagnoses. Lindsay Hayden is on the Category 1 Plan + 100 calories and states she is following her eating plan approximately 90-95% of the time. Lindsay Hayden states she is doing 0 minutes 0 times per week.  Today's visit was #: 2 Starting weight: 198 lbs Starting date: 12/13/2019 Today's weight: 193 lbs Today's date: 12/27/2019 Total lbs lost to date: 5 Total lbs lost since last in-office visit: 5  Interim History: Lindsay Hayden felt breakfast needed more food, lunch was filling, and dinner was satisfying. For snacks she was doing cheese and crackers, and 100 calorie almonds or nuts. She voices dinner to be similar to what she was eating prior. She is wondering when she can eat oatmeal for breakfast.  Subjective:   1. Type 2 diabetes mellitus with hyperglycemia, without long-term current use of insulin (HCC) Lindsay Hayden's A1c was 6.6 and insulin 25.6. Her 10 year ASCVD risk factor is 11.7%, moderate intensity statin recommended. I discussed labs with the patient today.  2. Other hyperlipidemia Lindsay Hayden's last LDL was 90 (on atorvastatin 10 mg, which is moderate intensity statin). Her 10 year ASCVD risk factor is 11.7%. I discussed labs with the patient today.  3. Vitamin D deficiency Lindsay Hayden denies nausea, vomiting, or muscle weakness, but she notes fatigue. Last Vit D level was 65.1. I discussed labs with the patient today.  Assessment/Plan:   1. Type 2 diabetes mellitus with hyperglycemia, without long-term current use of insulin (HCC) Good blood sugar control is important to decrease the likelihood of diabetic complications such as nephropathy, neuropathy, limb loss, blindness, coronary artery disease, and death. Intensive lifestyle modification including diet, exercise and weight loss are the first line of treatment for diabetes.  Lindsay Hayden's LDL goal is of <70, I discussed initiation of metformin. She is to think about starting medicine. She was encouraged to get an eye exam.  2. Other hyperlipidemia Cardiovascular risk and specific lipid/LDL goals reviewed. We discussed several lifestyle modifications today and Lindsay Hayden will continue to work on diet, exercise and weight loss efforts. We will repeat labs in 3 months, and if LDL is elevated then will increase Lipitor to 20 mg. Orders and follow up as documented in patient record.   Counseling Intensive lifestyle modifications are the first line treatment for this issue. . Dietary changes: Increase soluble fiber. Decrease simple carbohydrates. . Exercise changes: Moderate to vigorous-intensity aerobic activity 150 minutes per week if tolerated. . Lipid-lowering medications: see documented in medical record.  3. Vitamin D deficiency Low Vitamin D level contributes to fatigue and are associated with obesity, breast, and colon cancer. Lindsay Hayden agreed to continue taking OTC Vit D and will follow-up for routine testing of Vitamin D, at least 2-3 times per year to avoid over-replacement.  4. Class 1 obesity with serious comorbidity and body mass index (BMI) of 34.0 to 34.9 in adult, unspecified obesity type Lindsay Hayden is currently in the action stage of change. As such, her goal is to continue with weight loss efforts. She has agreed to the Category 2 Plan.   Exercise goals: No exercise has been prescribed at this time.  Behavioral modification strategies: increasing lean protein intake, increasing vegetables, meal planning and cooking strategies, keeping healthy foods in the home and planning for success.  Lindsay Hayden has agreed to follow-up with our clinic in 2 weeks. She was informed of the importance of frequent follow-up visits  to maximize her success with intensive lifestyle modifications for her multiple health conditions.   Objective:   Blood pressure 131/78,  pulse (!) 59, temperature 98.1 F (36.7 C), temperature source Oral, height 5\' 3"  (1.6 m), weight 193 lb (87.5 kg), last menstrual period 07/16/2017, SpO2 96 %. Body mass index is 34.19 kg/m.  General: Cooperative, alert, well developed, in no acute distress. HEENT: Conjunctivae and lids unremarkable. Cardiovascular: Regular rhythm.  Lungs: Normal work of breathing. Neurologic: No focal deficits.   Lab Results  Component Value Date   CREATININE 0.96 12/13/2019   BUN 20 12/13/2019   NA 141 12/13/2019   K 4.6 12/13/2019   CL 106 12/13/2019   CO2 22 12/13/2019   Lab Results  Component Value Date   ALT 18 12/13/2019   AST 15 12/13/2019   ALKPHOS 96 12/13/2019   BILITOT 0.6 12/13/2019   Lab Results  Component Value Date   HGBA1C 6.6 (H) 12/13/2019   Lab Results  Component Value Date   INSULIN 25.6 (H) 12/13/2019   Lab Results  Component Value Date   TSH 1.790 12/13/2019   Lab Results  Component Value Date   CHOL 149 10/07/2019   HDL 46 10/07/2019   LDLCALC 90 10/07/2019   TRIG 62 10/07/2019   CHOLHDL 3.2 10/07/2019   Lab Results  Component Value Date   WBC 5.0 12/13/2019   HGB 13.6 12/13/2019   HCT 42.4 12/13/2019   MCV 87 12/13/2019   PLT 296 12/13/2019   No results found for: IRON, TIBC, FERRITIN  Attestation Statements:   Reviewed by clinician on day of visit: allergies, medications, problem list, medical history, surgical history, family history, social history, and previous encounter notes.  Time spent on visit including pre-visit chart review and post-visit care and charting was 35 minutes.    I, Trixie Dredge, am acting as transcriptionist for April Manson, MD.  I have reviewed the above documentation for accuracy and completeness, and I agree with the above. - Ilene Qua, MD

## 2020-01-03 MED ORDER — ENTRESTO 49-51 MG PO TABS: 1.0000 | ORAL_TABLET | Freq: Two times a day (BID) | ORAL | 11 refills | Status: DC

## 2020-01-17 ENCOUNTER — Ambulatory Visit (INDEPENDENT_AMBULATORY_CARE_PROVIDER_SITE_OTHER): Payer: POS | Admitting: Family Medicine

## 2020-01-17 ENCOUNTER — Other Ambulatory Visit: Payer: Self-pay

## 2020-01-17 ENCOUNTER — Encounter (INDEPENDENT_AMBULATORY_CARE_PROVIDER_SITE_OTHER): Payer: Self-pay | Admitting: Family Medicine

## 2020-01-17 VITALS — BP 131/78 | HR 59 | Temp 98.0°F | Ht 63.0 in | Wt 188.0 lb

## 2020-01-17 DIAGNOSIS — I5042 Chronic combined systolic (congestive) and diastolic (congestive) heart failure: Secondary | ICD-10-CM | POA: Diagnosis not present

## 2020-01-17 DIAGNOSIS — E669 Obesity, unspecified: Secondary | ICD-10-CM | POA: Diagnosis not present

## 2020-01-17 DIAGNOSIS — E1165 Type 2 diabetes mellitus with hyperglycemia: Secondary | ICD-10-CM

## 2020-01-17 DIAGNOSIS — Z6833 Body mass index (BMI) 33.0-33.9, adult: Secondary | ICD-10-CM

## 2020-01-18 ENCOUNTER — Ambulatory Visit: Payer: POS | Attending: Internal Medicine

## 2020-01-18 DIAGNOSIS — Z23 Encounter for immunization: Secondary | ICD-10-CM

## 2020-01-18 NOTE — Progress Notes (Signed)
   Covid-19 Vaccination Clinic  Name:  Haidi Abston    MRN: [id number] DOB: 12/24/1960  01/18/2020  Ms. Belongia was observed post Covid-19 immunization for 15 minutes without incident. She was provided with Vaccine Information Sheet and instruction to access the V-Safe system.   Ms. Kries was instructed to call 911 with any severe reactions post vaccine: Marland Kitchen Difficulty breathing  . Swelling of face and throat  . A fast heartbeat  . A bad rash all over body  . Dizziness and weakness   Immunizations Administered    Name Date Dose VIS Date Route   Pfizer COVID-19 Vaccine 01/18/2020  1:17 PM 0.3 mL 11/10/2018 Intramuscular   Manufacturer: [place]   Lot: [id number]   [place]: [id number]

## 2020-01-18 NOTE — Progress Notes (Signed)
Chief Complaint:   OBESITY Lindsay Hayden is here to discuss her progress with her obesity treatment plan along with follow-up of her obesity related diagnoses. Lindsay Hayden is on the Category 2 Plan and states she is following her eating plan approximately 85% of the time. Lindsay Hayden states she is exercising for 0 minutes 0 times per week.  Today's visit was #: 3 Starting weight: 198 lbs Starting date: 12/13/2019 Today's weight: 188 lbs Today's date: 01/17/2020 Total lbs lost to date: 10 lbs Total lbs lost since last in-office visit: 5 lbs  Interim History: Lindsay Hayden says she had more days that she felt hungry.  She feels that she messed up on dinner by taking 2 ounces of meat and adding it to breakfast, but then she was still hungry after dinner.  She had a snack of cheese and crackers after dinner.  She is having 100 calorie nuts or yogurt for another snack.  Subjective:   1. Type 2 diabetes mellitus with hyperglycemia, without long-term current use of insulin (HCC) Lindsay Hayden is not on metformin.  She thought about the initiation of medication.  No feelings of hypoglycemia.  Lab Results  Component Value Date   HGBA1C 6.6 (H) 12/13/2019   Lab Results  Component Value Date   LDLCALC 90 10/07/2019   CREATININE 0.96 12/13/2019   Lab Results  Component Value Date   INSULIN 25.6 (H) 12/13/2019   2. Chronic combined systolic and diastolic CHF (congestive heart failure) (HCC) Blood pressure is well-controlled.  No orthopnea or significant increase in weight.  She sees Cardiology for this.  Assessment/Plan:   1. Type 2 diabetes mellitus with hyperglycemia, without long-term current use of insulin (HCC) Lindsay Hayden would like to defer initiation of metformin.  She will revisit medication if weight loss stalls or cravings increase.  2. Chronic combined systolic and diastolic CHF (congestive heart failure) Lindsay Hayden) Follow-up with Cardiology for further management.  3. Class 1 obesity  with serious comorbidity and body mass index (BMI) of 33.0 to 33.9 in adult, unspecified obesity type Lindsay Hayden is currently in the action stage of change. As such, her goal is to continue with weight loss efforts. She has agreed to the Category 2 Plan.   Exercise goals: No exercise has been prescribed at this time.  Behavioral modification strategies: increasing lean protein intake, meal planning and cooking strategies, ways to avoid night time snacking and better snacking choices.  Lindsay Hayden has agreed to follow-up with our clinic in 2 weeks. She was informed of the importance of frequent follow-up visits to maximize her success with intensive lifestyle modifications for her multiple health conditions.   Objective:   Blood pressure 131/78, pulse (!) 59, temperature 98 F (36.7 C), temperature source Oral, height 5\' 3"  (1.6 m), weight 188 lb (85.3 kg), last menstrual period 07/16/2017, SpO2 96 %. Body mass index is 33.3 kg/m.  General: Cooperative, alert, well developed, in no acute distress. HEENT: Conjunctivae and lids unremarkable. Cardiovascular: Regular rhythm.  Lungs: Normal work of breathing. Neurologic: No focal deficits.   Lab Results  Component Value Date   CREATININE 0.96 12/13/2019   BUN 20 12/13/2019   NA 141 12/13/2019   K 4.6 12/13/2019   CL 106 12/13/2019   CO2 22 12/13/2019   Lab Results  Component Value Date   ALT 18 12/13/2019   AST 15 12/13/2019   ALKPHOS 96 12/13/2019   BILITOT 0.6 12/13/2019   Lab Results  Component Value Date   HGBA1C 6.6 (H) 12/13/2019  Lab Results  Component Value Date   INSULIN 25.6 (H) 12/13/2019   Lab Results  Component Value Date   TSH 1.790 12/13/2019   Lab Results  Component Value Date   CHOL 149 10/07/2019   HDL 46 10/07/2019   LDLCALC 90 10/07/2019   TRIG 62 10/07/2019   CHOLHDL 3.2 10/07/2019   Lab Results  Component Value Date   WBC 5.0 12/13/2019   HGB 13.6 12/13/2019   HCT 42.4 12/13/2019   MCV 87  12/13/2019   PLT 296 12/13/2019   Attestation Statements:   Reviewed by clinician on day of visit: allergies, medications, problem list, medical history, surgical history, family history, social history, and previous encounter notes.  Time spent on visit including pre-visit chart review and post-visit care and charting was 15 minutes.   I, Water quality scientist, CMA, am acting as transcriptionist for Coralie Common, MD.  I have reviewed the above documentation for accuracy and completeness, and I agree with the above. - Jinny Blossom, MD

## 2020-01-31 ENCOUNTER — Ambulatory Visit (INDEPENDENT_AMBULATORY_CARE_PROVIDER_SITE_OTHER): Payer: POS | Admitting: Family Medicine

## 2020-01-31 ENCOUNTER — Encounter (INDEPENDENT_AMBULATORY_CARE_PROVIDER_SITE_OTHER): Payer: Self-pay | Admitting: Family Medicine

## 2020-01-31 ENCOUNTER — Other Ambulatory Visit: Payer: Self-pay

## 2020-01-31 VITALS — BP 114/74 | HR 62 | Temp 98.3°F | Ht 63.0 in | Wt 188.0 lb

## 2020-01-31 DIAGNOSIS — E038 Other specified hypothyroidism: Secondary | ICD-10-CM

## 2020-01-31 DIAGNOSIS — E669 Obesity, unspecified: Secondary | ICD-10-CM | POA: Diagnosis not present

## 2020-01-31 DIAGNOSIS — Z6833 Body mass index (BMI) 33.0-33.9, adult: Secondary | ICD-10-CM | POA: Diagnosis not present

## 2020-01-31 DIAGNOSIS — E1165 Type 2 diabetes mellitus with hyperglycemia: Secondary | ICD-10-CM | POA: Diagnosis not present

## 2020-01-31 NOTE — Progress Notes (Signed)
Chief Complaint:   OBESITY Lindsay Hayden is here to discuss her progress with her obesity treatment plan along with follow-up of her obesity related diagnoses. Lindsay Hayden is on the Category 2 Plan and states she is following her eating plan approximately 85-90% of the time. [name] states she is exercising 0 minutes 0 times per week.  Today's visit was #: 4 Starting weight: 198 lbs Starting date: 12/13/2019 Today's weight: 188 lbs Today's date: 01/31/2020 Total lbs lost to date: 10 Total lbs lost since last in-office visit: 0  Interim History: Lindsay Hayden states the last few weeks were a little rough. For breakfast she did 2 eggs with cheese. She did make some changes to lunch with different vegetables. She states she found herself hungry if she did a peanut butter option at breakfast and may want to add some meat to breakfast. She is doing string cheese for snack and ice cream. She is going to a winery next weekend with her daughter.  Subjective:   Type 2 diabetes mellitus with hyperglycemia, without long-term current use of insulin ([place]). Moxie is not on metformin.  Lab Results  Component Value Date   HGBA1C 6.6 (H) 12/13/2019   Lab Results  Component Value Date   LDLCALC 90 10/07/2019   CREATININE 0.96 12/13/2019   Lab Results  Component Value Date   INSULIN 25.6 (H) 12/13/2019   Other specified hypothyroidism. Last thyroid panel was all within normal limits. No cold intolerance or heat intolerance. She endorses fatigue.  Lab Results  Component Value Date   TSH 1.790 12/13/2019   Assessment/Plan:   Type 2 diabetes mellitus with hyperglycemia, without long-term current use of insulin ([place]). Good blood sugar control is important to decrease the likelihood of diabetic complications such as nephropathy, neuropathy, limb loss, blindness, coronary artery disease, and death. Intensive lifestyle modification including diet, exercise and weight loss are the first  line of treatment for diabetes. [name] will have repeat labs in early July.  Other specified hypothyroidism. Patient with long-standing hypothyroidism, on levothyroxine therapy. She appears euthyroid. Orders and follow up as documented in patient record. Will repeat labs in 2 months.  Counseling . Good thyroid control is important for overall health. Supratherapeutic thyroid levels are dangerous and will not improve weight loss results. . The correct way to take levothyroxine is fasting, with water, separated by at least 30 minutes from breakfast, and separated by more than 4 hours from calcium, iron, multivitamins, acid reflux medications (PPIs).   Class 1 obesity with serious comorbidity and body mass index (BMI) of 33.0 to 33.9 in adult, unspecified obesity type.  Lindsay Hayden is currently in the action stage of change. As such, her goal is to continue with weight loss efforts. She has agreed to the Category 2 Plan and will journal 200-300 calories and 20+ grams of protein at breakfast.   Exercise goals: For substantial health benefits, adults should do at least 150 minutes (2 hours and 30 minutes) a week of moderate-intensity, or 75 minutes (1 hour and 15 minutes) a week of vigorous-intensity aerobic physical activity, or an equivalent combination of moderate- and vigorous-intensity aerobic activity. Aerobic activity should be performed in episodes of at least 10 minutes, and preferably, it should be spread throughout the week.  Behavioral modification strategies: increasing lean protein intake, increasing vegetables, meal planning and cooking strategies, keeping healthy foods in the home and planning for success.  Lindsay Hayden has agreed to follow-up with our clinic in 2 weeks. She was informed of  the importance of frequent follow-up visits to maximize her success with intensive lifestyle modifications for her multiple health conditions.   Objective:   Blood pressure 114/74, pulse 62,  temperature 98.3 F (36.8 C), temperature source Oral, height 5\' 3"  (1.6 m), weight 188 lb (85.3 kg), last menstrual period 07/16/2017, SpO2 95 %. Body mass index is 33.3 kg/m.  General: Cooperative, alert, well developed, in no acute distress. HEENT: Conjunctivae and lids unremarkable. Cardiovascular: Regular rhythm.  Lungs: Normal work of breathing. Neurologic: No focal deficits.   Lab Results  Component Value Date   CREATININE 0.96 12/13/2019   BUN 20 12/13/2019   NA 141 12/13/2019   K 4.6 12/13/2019   CL 106 12/13/2019   CO2 22 12/13/2019   Lab Results  Component Value Date   ALT 18 12/13/2019   AST 15 12/13/2019   ALKPHOS 96 12/13/2019   BILITOT 0.6 12/13/2019   Lab Results  Component Value Date   HGBA1C 6.6 (H) 12/13/2019   Lab Results  Component Value Date   INSULIN 25.6 (H) 12/13/2019   Lab Results  Component Value Date   TSH 1.790 12/13/2019   Lab Results  Component Value Date   CHOL 149 10/07/2019   HDL 46 10/07/2019   LDLCALC 90 10/07/2019   TRIG 62 10/07/2019   CHOLHDL 3.2 10/07/2019   Lab Results  Component Value Date   WBC 5.0 12/13/2019   HGB 13.6 12/13/2019   HCT 42.4 12/13/2019   MCV 87 12/13/2019   PLT 296 12/13/2019   No results found for: IRON, TIBC, FERRITIN  Attestation Statements:   Reviewed by clinician on day of visit: allergies, medications, problem list, medical history, surgical history, family history, social history, and previous encounter notes.  Time spent on visit including pre-visit chart review and post-visit charting and care was 15 minutes.   I, Michaelene Song, am acting as transcriptionist for Coralie Common, MD   I have reviewed the above documentation for accuracy and completeness, and I agree with the above. - Jinny Blossom, MD

## 2020-02-10 ENCOUNTER — Other Ambulatory Visit: Payer: Self-pay | Admitting: Cardiology

## 2020-02-22 ENCOUNTER — Encounter (INDEPENDENT_AMBULATORY_CARE_PROVIDER_SITE_OTHER): Payer: Self-pay | Admitting: Family Medicine

## 2020-02-22 ENCOUNTER — Other Ambulatory Visit: Payer: Self-pay

## 2020-02-22 ENCOUNTER — Ambulatory Visit (INDEPENDENT_AMBULATORY_CARE_PROVIDER_SITE_OTHER): Payer: POS | Admitting: Family Medicine

## 2020-02-22 VITALS — BP 118/74 | HR 59 | Temp 97.7°F | Ht 63.0 in | Wt 187.0 lb

## 2020-02-22 DIAGNOSIS — E66811 Obesity, class 1: Secondary | ICD-10-CM

## 2020-02-22 DIAGNOSIS — E1165 Type 2 diabetes mellitus with hyperglycemia: Secondary | ICD-10-CM | POA: Diagnosis not present

## 2020-02-22 DIAGNOSIS — Z6833 Body mass index (BMI) 33.0-33.9, adult: Secondary | ICD-10-CM

## 2020-02-22 DIAGNOSIS — I10 Essential (primary) hypertension: Secondary | ICD-10-CM | POA: Diagnosis not present

## 2020-02-22 DIAGNOSIS — E669 Obesity, unspecified: Secondary | ICD-10-CM | POA: Diagnosis not present

## 2020-02-23 NOTE — Progress Notes (Signed)
Chief Complaint:   OBESITY Lindsay Hayden is here to discuss her progress with her obesity treatment plan along with follow-up of her obesity related diagnoses. Lindsay Hayden is on the Category 2 Plan and states she is following her eating plan approximately 90% of the time. Lindsay Hayden states she is exercising 0 minutes 0 times per week.  Today's visit was #: 5 Starting weight: 198 lbs Starting date: 12/13/2019 Today's weight: 187 lbs Today's date: 02/22/2020 Total lbs lost to date: 11 Total lbs lost since last in-office visit: 1  Interim History: Lindsay Hayden voices she has stayed on plan almost exclusively with minimal indulgences. She is able to get in ~8-10 oz of meat at night. She is doing an egg at breakfast and 4 oz sandwich with apple. She does note occasional hunger after dinner, yet doesn't recall needing a snack after dinner consistently.  Subjective:   Essential hypertension. Blood pressure is well controlled today. No chest pain, chest pressure, or headache. Lindsay Hayden sees Cardiology.  BP Readings from Last 3 Encounters:  02/22/20 118/74  01/31/20 114/74  01/17/20 131/78   Lab Results  Component Value Date   CREATININE 0.96 12/13/2019   CREATININE 0.95 10/22/2019   CREATININE 1.04 (H) 10/07/2019   Type 2 diabetes mellitus with hyperglycemia, without long-term current use of insulin ([place]). Cornell is not on metformin.   Lab Results  Component Value Date   HGBA1C 6.6 (H) 12/13/2019   Lab Results  Component Value Date   LDLCALC 90 10/07/2019   CREATININE 0.96 12/13/2019   Lab Results  Component Value Date   INSULIN 25.6 (H) 12/13/2019   Assessment/Plan:   Essential hypertension. Lindsay Hayden is working on healthy weight loss and exercise to improve blood pressure control. We will watch for signs of hypotension as she continues her lifestyle modifications.  She will follow-up with Cardiology as scheduled.  Type 2 diabetes mellitus with hyperglycemia,  without long-term current use of insulin ([place]). Good blood sugar control is important to decrease the likelihood of diabetic complications such as nephropathy, neuropathy, limb loss, blindness, coronary artery disease, and death. Intensive lifestyle modification including diet, exercise and weight loss are the first line of treatment for diabetes. Lindsay Hayden will have follow-up labs next month.  Class 1 obesity with serious comorbidity and body mass index (BMI) of 33.0 to 33.9 in adult, unspecified obesity type.  Lindsay Hayden is currently in the action stage of change. As such, her goal is to continue with weight loss efforts. She has agreed to the Category 2 Plan + 100 calories.   Exercise goals: No exercise has been prescribed at this time.  Behavioral modification strategies: increasing lean protein intake, increasing vegetables, meal planning and cooking strategies, keeping healthy foods in the home and planning for success.  Lindsay Hayden has agreed to follow-up with our clinic in 2-3 weeks. She was informed of the importance of frequent follow-up visits to maximize her success with intensive lifestyle modifications for her multiple health conditions.   Objective:   Blood pressure 118/74, pulse (!) 59, temperature 97.7 F (36.5 C), temperature source Oral, height 5\' 3"  (1.6 m), weight 187 lb (84.8 kg), last menstrual period 07/16/2017, SpO2 97 %. Body mass index is 33.13 kg/m.  General: Cooperative, alert, well developed, in no acute distress. HEENT: Conjunctivae and lids unremarkable. Cardiovascular: Regular rhythm.  Lungs: Normal work of breathing. Neurologic: No focal deficits.   Lab Results  Component Value Date   CREATININE 0.96 12/13/2019   BUN 20 12/13/2019   NA  141 12/13/2019   K 4.6 12/13/2019   CL 106 12/13/2019   CO2 22 12/13/2019   Lab Results  Component Value Date   ALT 18 12/13/2019   AST 15 12/13/2019   ALKPHOS 96 12/13/2019   BILITOT 0.6 12/13/2019   Lab  Results  Component Value Date   HGBA1C 6.6 (H) 12/13/2019   Lab Results  Component Value Date   INSULIN 25.6 (H) 12/13/2019   Lab Results  Component Value Date   TSH 1.790 12/13/2019   Lab Results  Component Value Date   CHOL 149 10/07/2019   HDL 46 10/07/2019   LDLCALC 90 10/07/2019   TRIG 62 10/07/2019   CHOLHDL 3.2 10/07/2019   Lab Results  Component Value Date   WBC 5.0 12/13/2019   HGB 13.6 12/13/2019   HCT 42.4 12/13/2019   MCV 87 12/13/2019   PLT 296 12/13/2019   No results found for: IRON, TIBC, FERRITIN  Attestation Statements:   Reviewed by clinician on day of visit: allergies, medications, problem list, medical history, surgical history, family history, social history, and previous encounter notes.  Time spent on visit including pre-visit chart review and post-visit charting and care was 17 minutes.   I, Michaelene Song, am acting as transcriptionist for Coralie Common, MD  I have reviewed the above documentation for accuracy and completeness, and I agree with the above. - Jinny Blossom, MD

## 2020-02-27 ENCOUNTER — Encounter: Payer: Self-pay | Admitting: Cardiology

## 2020-02-27 NOTE — Progress Notes (Signed)
Virtual Visit via Telephone Note   This visit type was conducted due to national recommendations for restrictions regarding the COVID-19 Pandemic (e.g. social distancing) in an effort to limit this patient's exposure and mitigate transmission in our community.  Due to her co-morbid illnesses, this patient is at least at moderate risk for complications without adequate follow up.  This format is felt to be most appropriate for this patient at this time.  All issues noted in this document were discussed and addressed.  A limited physical exam was performed with this format.  Please refer to the patient's chart for her consent to telehealth for [facility].  Evaluation Performed:  Follow-up visit  This visit type was conducted due to national recommendations for restrictions regarding the COVID-19 Pandemic (e.g. social distancing).  This format is felt to be most appropriate for this patient at this time.  All issues noted in this document were discussed and addressed.  No physical exam was performed (except for noted visual exam findings with Video Visits).  Please refer to the patient's chart (MyChart message for video visits and phone note for telephone visits) for the patient's consent to telehealth for [facility].  Date:  02/28/2020   ID:  Lindsay Hayden, DOB 1961/06/12, MRN [id number]  Patient Location:  Home  Provider location:   [place]  PCP:  Kathyrn Lass, MD  Cardiologist:  Fransico Him, MD  Electrophysiologist:  None   Chief Complaint:  NICM  History of Present Illness:    Lindsay Hayden is a 59 y.o. female who presents via audio/video conferencing for a telehealth visit today.    Lindsay Hayden is a 59 y.o. 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  She was previously followed by Dr. Wynonia Lawman. She was found to have low EF in 2006 and  cath around that time as well as a few years later showed no evidence of of coronary obstruction. She was also found to be hypertensive at that time around 150/90s. She had no history of heavy ETOH or illicit drug use. No family history of cardiomyopathy. She reported a significant viral illness in 2001 but this was years before diagnosis of CHF.  F/u echo 03/2019 showed EF 35-40%, moderate LVH, pseudonormalization, LBBB, and normal RVSP. She was  previously on hydralazine but did not tolerate nighttime dosing due to tinnitus. Cardiac MRI 08/09/19 and showed moderate LVE with diffuse hypokinesis, EF 43%, no infarct/scar and no evidence of amyloidosis, mild MR, mild LAE, normal RV size and function.   She is here today for followup and is doing well.  She denies any chest pain or pressure, SOB, DOE, PND, orthopnea, LE edema, dizziness, palpitations or syncope. She is compliant with her meds and is tolerating meds with no SE.    The patient does not have symptoms concerning for COVID-19 infection (fever, chills, cough, or new shortness of breath).    Prior CV studies:   The following studies were reviewed today:  none  Past Medical History:  Diagnosis Date  . Anemia   . Arthritis   . Asthma   . Cardiomyopathy ([place])   . Chronic combined systolic and diastolic CHF (congestive heart failure) ([place])   . Diabetes mellitus without complication ([place])   . Hypercalcemia   . Hyperlipidemia   .  Hypertension   . Hypothyroid   . LBBB (left bundle branch block)   . LVH (left ventricular hypertrophy)   . NICM (nonischemic cardiomyopathy) ([place])   . OSA (obstructive sleep apnea)   . Overweight   . Prediabetes   . Shortness of breath   . Sleep apnea    Past Surgical History:  Procedure Laterality Date  . ACHILLES TENDON SURGERY     right  . CESAREAN SECTION     x2  . KNEE  ARTHROSCOPY WITH MENISCAL REPAIR     right knee  . OVARIAN CYST REMOVAL     right     Current Meds  Medication Sig  . acetaminophen (TYLENOL) 650 MG CR tablet Take 650 mg by mouth every 8 (eight) hours as needed for pain.  Marland Kitchen albuterol (PROVENTIL HFA;VENTOLIN HFA) 108 (90 Base) MCG/ACT inhaler Inhale into the lungs every 6 (six) hours as needed for wheezing or shortness of breath.  Marland Kitchen atorvastatin (LIPITOR) 10 MG tablet Take 10 mg by mouth daily.  . Cholecalciferol (VITAMIN D) 125 MCG (5000 UT) CAPS Take 5,000 Units by mouth daily.   . Fluticasone Propionate, Inhal, (FLOVENT IN) Inhale 110 mcg into the lungs 2 (two) times daily.   . furosemide (LASIX) 20 MG tablet Take 1 tablet (20 mg total) by mouth daily.  Marland Kitchen LEVOXYL 88 MCG tablet Take 1 tablet by mouth daily.  . meclizine (ANTIVERT) 25 MG tablet Take 1 tablet (25 mg total) by mouth 3 (three) times daily as needed for dizziness.  . metoprolol succinate (TOPROL-XL) 25 MG 24 hr tablet Take 1 tablet (25 mg total) by mouth daily.  . naproxen sodium (ALEVE) 220 MG tablet Take 220 mg by mouth daily as needed.   . sacubitril-valsartan (ENTRESTO) 49-51 MG Take 1 tablet by mouth 2 (two) times daily.  Marland Kitchen spironolactone (ALDACTONE) 25 MG tablet Take 1 tablet (25 mg total) by mouth daily.  Marland Kitchen tiZANidine (ZANAFLEX) 4 MG tablet Take 4 mg by mouth as needed. As needed for muscle spasms     Allergies:   Patient has no known allergies.   Social History   Tobacco Use  . Smoking status: Never Smoker  . Smokeless tobacco: Never Used  Vaping Use  . Vaping Use: Never used  Substance Use Topics  . Alcohol use: Yes    Alcohol/week: 1.0 standard drink    Types: 1 Glasses of wine per week    Comment: 1-2 times a month  . Drug use: No     Family Hx: The patient's family history includes Alcohol abuse in her father; Cancer in her father; Diabetes in her mother; Hypertension in her father and mother; Obesity in her mother; Stroke in her mother.  ROS:    Please see the history of present illness.     All other systems reviewed and are negative.   Labs/Other Tests and Data Reviewed:    Recent Labs: 07/13/2019: NT-Pro BNP 99 12/13/2019: ALT 18; BUN 20; Creatinine, Ser 0.96; Hemoglobin 13.6; Platelets 296; Potassium 4.6; Sodium 141; TSH 1.790   Recent Lipid Panel Lab Results  Component Value Date/Time   CHOL 149 10/07/2019 08:47 AM   TRIG 62 10/07/2019 08:47 AM   HDL 46 10/07/2019 08:47 AM   CHOLHDL 3.2 10/07/2019 08:47 AM   LDLCALC 90 10/07/2019 08:47 AM    Wt Readings from Last 3 Encounters:  02/28/20 188 lb (85.3 kg)  02/22/20 187 lb (84.8 kg)  01/31/20 188 lb (85.3 kg)  Objective:    Vital Signs:  Ht 5\' 3"  (1.6 m)   Wt 188 lb (85.3 kg)   LMP 07/16/2017   BMI 33.30 kg/m     ASSESSMENT & PLAN:    1.  Chronic combined CHF/NICM -considered nonischemic as 2 caths have shown normal coronary arteries -Cardiac MRI 07/2019 showed no evidence of infiltrative disease including amyloid>>EF 43% -weight has been stable -we have been uptitrating guideline-directed HF medical therapy -She denies any SOB or LE edema -continue Toprol XL 25mg  daily, Entresto 49-51mg  BID, Lasix 20mg  daily and spiro 25mg  daily -check BMET  2.  Essential HTN  -BP controlled on exam today -continue Toprol, Mearl Latin  3.  Chronic LBBB -normal coronary arteries on cath  4.  OSA -   The patient is tolerating PAP therapy well without any problems. The PAP download was reviewed today and showed an AHI of 0.4/hr on auto PAP  with 100% compliance in using more than 4 hours nightly.  The patient has been using and benefiting from PAP use and will continue to benefit from therapy.   5.  Obesity -she has been going to healthy weight and wellness clinic -she lost 10lbs and had been stuck at current weight  COVID-19 Education: The signs and symptoms of COVID-19 were discussed with the patient and how to seek care for testing (follow up with PCP  or arrange E-visit).  The importance of social distancing was discussed today.  Patient Risk:   After full review of this patient's clinical status, I feel that they are at least moderate risk at this time.  Time:   Today, I have spent 20 minutes on telemedicine discussing medical problems including chronic CHF, NICM, HTN, LBBB and reviewing patient's chart including labs.  Medication Adjustments/Labs and Tests Ordered: Current medicines are reviewed at length with the patient today.  Concerns regarding medicines are outlined above.  Tests Ordered: No orders of the defined types were placed in this encounter.  Medication Changes: No orders of the defined types were placed in this encounter.   Disposition:  Follow up in 6 month(s)  Signed, Fransico Him, MD  02/28/2020 8:58 AM    [facility]

## 2020-02-28 ENCOUNTER — Telehealth (INDEPENDENT_AMBULATORY_CARE_PROVIDER_SITE_OTHER): Payer: POS | Admitting: Cardiology

## 2020-02-28 ENCOUNTER — Other Ambulatory Visit: Payer: Self-pay

## 2020-02-28 ENCOUNTER — Encounter: Payer: Self-pay | Admitting: Cardiology

## 2020-02-28 VITALS — Ht 63.0 in | Wt 188.0 lb

## 2020-02-28 DIAGNOSIS — G4733 Obstructive sleep apnea (adult) (pediatric): Secondary | ICD-10-CM | POA: Diagnosis not present

## 2020-02-28 DIAGNOSIS — E669 Obesity, unspecified: Secondary | ICD-10-CM

## 2020-02-28 DIAGNOSIS — I5042 Chronic combined systolic (congestive) and diastolic (congestive) heart failure: Secondary | ICD-10-CM | POA: Diagnosis not present

## 2020-02-28 DIAGNOSIS — I420 Dilated cardiomyopathy: Secondary | ICD-10-CM

## 2020-02-28 DIAGNOSIS — I10 Essential (primary) hypertension: Secondary | ICD-10-CM | POA: Diagnosis not present

## 2020-02-28 MED ORDER — FUROSEMIDE 20 MG PO TABS: 20.0000 mg | ORAL_TABLET | Freq: Every day | ORAL | 3 refills | Status: DC

## 2020-02-28 NOTE — Addendum Note (Signed)
Addended by: Antonieta Iba on: 02/28/2020 09:02 AM   Modules accepted: Orders

## 2020-02-28 NOTE — Patient Instructions (Signed)

## 2020-03-13 ENCOUNTER — Ambulatory Visit (INDEPENDENT_AMBULATORY_CARE_PROVIDER_SITE_OTHER): Payer: POS | Admitting: Family Medicine

## 2020-03-13 ENCOUNTER — Other Ambulatory Visit: Payer: Self-pay

## 2020-03-13 ENCOUNTER — Encounter (INDEPENDENT_AMBULATORY_CARE_PROVIDER_SITE_OTHER): Payer: Self-pay | Admitting: Family Medicine

## 2020-03-13 VITALS — BP 131/78 | HR 55 | Temp 98.0°F | Ht 63.0 in | Wt 187.0 lb

## 2020-03-13 DIAGNOSIS — Z9189 Other specified personal risk factors, not elsewhere classified: Secondary | ICD-10-CM | POA: Diagnosis not present

## 2020-03-13 DIAGNOSIS — E1165 Type 2 diabetes mellitus with hyperglycemia: Secondary | ICD-10-CM | POA: Diagnosis not present

## 2020-03-13 DIAGNOSIS — I10 Essential (primary) hypertension: Secondary | ICD-10-CM

## 2020-03-13 DIAGNOSIS — E1159 Type 2 diabetes mellitus with other circulatory complications: Secondary | ICD-10-CM

## 2020-03-13 DIAGNOSIS — E669 Obesity, unspecified: Secondary | ICD-10-CM

## 2020-03-13 DIAGNOSIS — Z6833 Body mass index (BMI) 33.0-33.9, adult: Secondary | ICD-10-CM

## 2020-03-13 NOTE — Progress Notes (Signed)
Marjory Sneddon, D.O., ABFM, ABOM Obesity Medicine Specialist    Chief Complaint:   OBESITY Lindsay Hayden is here to discuss her progress with her obesity treatment plan along with follow-up of her obesity related diagnoses. Lindsay Hayden is on the Category 2 Plan +100 calories and states she is following her eating plan approximately 90% of the time. Lindsay Hayden states she is exercising for 0 minutes 0 times per week.  Today's visit was #: 6 Starting weight: 198 lbs Starting date: 12/13/2019 Today's weight: 187 lbs Today's date: 03/14/2020 Total lbs lost to date: 11 lbs Total lbs lost since last in-office visit: 0  Interim History: Lindsay Hayden says that her hunger and cravings are controlled. She is choosing healthier options when dining out such as kale salad at [employer].  She chooses vinaigrette dressing instead of the regular.  She likes ice cream, though.  She finds the meal plan is working well for her.  She says she is not bored yet.  She is tolerating it well without difficulty.  Subjective:   1. Type 2 diabetes mellitus with hyperglycemia, without long-term current use of insulin (HCC) Blood sugars at home within normal limits.  Fasting blood sugars below 140.  Denies symptoms or concerns.  Lab Results  Component Value Date   HGBA1C 6.6 (H) 12/13/2019   Lab Results  Component Value Date   LDLCALC 90 10/07/2019   CREATININE 0.96 12/13/2019   Lab Results  Component Value Date   INSULIN 25.6 (H) 12/13/2019   2. Hypertension associated with diabetes ([place]) Review: taking medications as instructed, no medication side effects noted, no chest pain on exertion, no dyspnea on exertion, no swelling of ankles.  Blood pressure essentially at goal today.  Denies cardiac symptoms.  Tolerating blood pressure medications well with no side effects.  BP Readings from Last 3 Encounters:  03/13/20 131/78  02/22/20 118/74  01/31/20 114/74   Assessment/Plan:   1. Type 2 diabetes  mellitus with hyperglycemia, without long-term current use of insulin (HCC) Good blood sugar control is important to decrease the likelihood of diabetic complications such as nephropathy, neuropathy, limb loss, blindness, coronary artery disease, and death. Intensive lifestyle modification including diet, exercise and weight loss are the first line of treatment for diabetes.  Continue medications per PCP for now.  Consider rechecking A1c at next office visit and consider medications in the future to aid in blood sugar control and weight los goals.  Continue prudent nutritional plan and weight loss.  2. Hypertension associated with diabetes ([place]) Lindsay Hayden is working on healthy weight loss and exercise to improve blood pressure control. We will watch for signs of hypotension as she continues her lifestyle modifications.  Continue medications per PCP/specialist.  Will continue to monitor alongside them.  Continue prudent nutritional plan and weight loss.  3. Class 1 obesity with serious comorbidity and body mass index (BMI) of 33.0 to 33.9 in adult, unspecified obesity type Lindsay Hayden is currently in the action stage of change. As such, her goal is to continue with weight loss efforts. She has agreed to the Category 2 Plan +100 calories and 90+ grams of protein.   Exercise goals: As is.  Behavioral modification strategies: increasing lean protein intake, decreasing simple carbohydrates, increasing water intake and decreasing sodium intake.  Lindsay Hayden has agreed to follow-up with our clinic in 2 weeks. She was informed of the importance of frequent follow-up visits to maximize her success with intensive lifestyle modifications for her multiple health conditions.   Objective:  Blood pressure 131/78, pulse (!) 55, temperature 98 F (36.7 C), temperature source Oral, height 5\' 3"  (1.6 m), weight 187 lb (84.8 kg), last menstrual period 07/16/2017, SpO2 96 %. Body mass index is 33.13 kg/m.  General:  Cooperative, alert, well developed, in no acute distress. HEENT: Conjunctivae and lids unremarkable. Cardiovascular: Regular rhythm.  Lungs: Normal work of breathing. Neurologic: No focal deficits.   Lab Results  Component Value Date   CREATININE 0.96 12/13/2019   BUN 20 12/13/2019   NA 141 12/13/2019   K 4.6 12/13/2019   CL 106 12/13/2019   CO2 22 12/13/2019   Lab Results  Component Value Date   ALT 18 12/13/2019   AST 15 12/13/2019   ALKPHOS 96 12/13/2019   BILITOT 0.6 12/13/2019   Lab Results  Component Value Date   HGBA1C 6.6 (H) 12/13/2019   Lab Results  Component Value Date   INSULIN 25.6 (H) 12/13/2019   Lab Results  Component Value Date   TSH 1.790 12/13/2019   Lab Results  Component Value Date   CHOL 149 10/07/2019   HDL 46 10/07/2019   LDLCALC 90 10/07/2019   TRIG 62 10/07/2019   CHOLHDL 3.2 10/07/2019   Lab Results  Component Value Date   WBC 5.0 12/13/2019   HGB 13.6 12/13/2019   HCT 42.4 12/13/2019   MCV 87 12/13/2019   PLT 296 12/13/2019   Attestation Statements:   Reviewed by clinician on day of visit: allergies, medications, problem list, medical history, surgical history, family history, social history, and previous encounter notes.  Time spent on visit including pre-visit chart review and post-visit care and charting was 31 minutes.   I, Water quality scientist, CMA, am acting as Location manager for [employer], DO.  I have reviewed the above documentation for accuracy and completeness, and I agree with the above. Mellody Dance, DO

## 2020-03-27 ENCOUNTER — Other Ambulatory Visit: Payer: Self-pay

## 2020-03-27 ENCOUNTER — Ambulatory Visit (INDEPENDENT_AMBULATORY_CARE_PROVIDER_SITE_OTHER): Payer: POS | Admitting: Family Medicine

## 2020-03-27 ENCOUNTER — Encounter (INDEPENDENT_AMBULATORY_CARE_PROVIDER_SITE_OTHER): Payer: Self-pay | Admitting: Family Medicine

## 2020-03-27 VITALS — BP 119/76 | HR 59 | Temp 98.2°F | Ht 63.0 in | Wt 184.0 lb

## 2020-03-27 DIAGNOSIS — E669 Obesity, unspecified: Secondary | ICD-10-CM | POA: Diagnosis not present

## 2020-03-27 DIAGNOSIS — I10 Essential (primary) hypertension: Secondary | ICD-10-CM

## 2020-03-27 DIAGNOSIS — Z6832 Body mass index (BMI) 32.0-32.9, adult: Secondary | ICD-10-CM | POA: Diagnosis not present

## 2020-03-27 DIAGNOSIS — E1165 Type 2 diabetes mellitus with hyperglycemia: Secondary | ICD-10-CM | POA: Diagnosis not present

## 2020-03-30 NOTE — Progress Notes (Signed)
Chief Complaint:   OBESITY Lindsay Hayden is here to discuss her progress with her obesity treatment plan along with follow-up of her obesity related diagnoses. Lindsay Hayden is on the Category 2 Plan + 100 calories and states she is following her eating plan approximately 85 to 90% of the time. Lindsay Hayden states that she did not exercise this week.  Today's visit was #: 7 Starting weight: 198 lbs  Starting date: 12/13/19 Today's weight: 184 lbs Today's date: 03/27/2020 Total lbs lost to date: 14 Total lbs lost since last in-office visit: 3  Interim History: Lindsay Hayden has been on the plan the majority of the time. She may be interested in starting to put meals together herself. Lindsay Hayden is otherwise still following the plan almost completely.  Subjective:   1. Essential hypertension Lindsay Hayden's blood pressure is well controlled on Toprol and Entresto. Cardiovascular ROS: no chest pain, chest pressure, or headache.  BP Readings from Last 3 Encounters:  03/27/20 119/76  03/13/20 131/78  02/22/20 118/74   Lab Results  Component Value Date   CREATININE 0.96 12/13/2019   CREATININE 0.95 10/22/2019   CREATININE 1.04 (H) 10/07/2019    2. Type 2 diabetes mellitus with hyperglycemia, without long-term current use of insulin (HCC) Lindsay Hayden has a diagnosis of insulin resistance based on her elevated fasting insulin level >5. Her A1c was 6.6 and Insulin was 25.6. Lindsay Hayden is not on medications. She continues to work on diet and exercise to decrease her risk of diabetes.  Lab Results  Component Value Date   INSULIN 25.6 (H) 12/13/2019   Lab Results  Component Value Date   HGBA1C 6.6 (H) 12/13/2019    Assessment/Plan:   1. Essential hypertension Lindsay Hayden is working on healthy weight loss and exercise to improve blood pressure control. Lindsay Hayden agrees to continue her current medications. We will watch for signs of hypotension as she continues her lifestyle  modifications.  2. Type 2 diabetes mellitus with hyperglycemia, without long-term current use of insulin (HCC) Good blood sugar control is important to decrease the likelihood of diabetic complications such as nephropathy, neuropathy, limb loss, blindness, coronary artery disease, and death. Intensive lifestyle modification including diet, exercise and weight loss are the first line of treatment for diabetes. We will draw labs at her next appointment.  3. Class 1 obesity with serious comorbidity and body mass index (BMI) of 32.0 to 32.9 in adult, unspecified obesity type  Lindsay Hayden is currently in the action stage of change. As such, her goal is to continue with weight loss efforts. She has agreed to the Category 2 Plan + 100 and keep a food journal of 400-500 calories for supper.   Exercise goals: Lindsay Hayden is to start walking or ride a stationary bike for 15 minutes for 2 to 3 times per week.  Behavioral modification strategies: increasing lean protein intake, increasing vegetables, meal planning and cooking strategies, keeping healthy foods in the home and planning for success.  Lindsay Hayden has agreed to follow-up with our clinic in 2 to 3 weeks. She was informed of the importance of frequent follow-up visits to maximize her success with intensive lifestyle modifications for her multiple health conditions.   Objective:   Blood pressure 119/76, pulse (!) 59, temperature 98.2 F (36.8 C), temperature source Oral, height 5\' 3"  (1.6 m), weight 184 lb (83.5 kg), last menstrual period 07/16/2017, SpO2 97 %. Body mass index is 32.59 kg/m.  General: Cooperative, alert, well developed, in no acute distress. HEENT: Conjunctivae and lids unremarkable. Cardiovascular: Regular  rhythm.  Lungs: Normal work of breathing. Neurologic: No focal deficits.   Lab Results  Component Value Date   CREATININE 0.96 12/13/2019   BUN 20 12/13/2019   NA 141 12/13/2019   K 4.6 12/13/2019   CL 106 12/13/2019    CO2 22 12/13/2019   Lab Results  Component Value Date   ALT 18 12/13/2019   AST 15 12/13/2019   ALKPHOS 96 12/13/2019   BILITOT 0.6 12/13/2019   Lab Results  Component Value Date   HGBA1C 6.6 (H) 12/13/2019   Lab Results  Component Value Date   INSULIN 25.6 (H) 12/13/2019   Lab Results  Component Value Date   TSH 1.790 12/13/2019   Lab Results  Component Value Date   CHOL 149 10/07/2019   HDL 46 10/07/2019   LDLCALC 90 10/07/2019   TRIG 62 10/07/2019   CHOLHDL 3.2 10/07/2019   Lab Results  Component Value Date   WBC 5.0 12/13/2019   HGB 13.6 12/13/2019   HCT 42.4 12/13/2019   MCV 87 12/13/2019   PLT 296 12/13/2019   No results found for: IRON, TIBC, FERRITIN  Attestation Statements:   Reviewed by clinician on day of visit: allergies, medications, problem list, medical history, surgical history, family history, social history, and previous encounter notes.  Time spent on visit including pre-visit chart review and post-visit care and charting was 15 minutes.   IMarcille Blanco, CMA, am acting as transcriptionist for Coralie Common, MD  I have reviewed the above documentation for accuracy and completeness, and I agree with the above. - Jinny Blossom, MD

## 2020-04-10 ENCOUNTER — Encounter (INDEPENDENT_AMBULATORY_CARE_PROVIDER_SITE_OTHER): Payer: Self-pay | Admitting: Family Medicine

## 2020-04-10 ENCOUNTER — Other Ambulatory Visit: Payer: Self-pay

## 2020-04-10 ENCOUNTER — Ambulatory Visit (INDEPENDENT_AMBULATORY_CARE_PROVIDER_SITE_OTHER): Payer: POS | Admitting: Family Medicine

## 2020-04-10 VITALS — BP 136/81 | HR 57 | Temp 97.9°F | Ht 63.0 in | Wt 186.0 lb

## 2020-04-10 DIAGNOSIS — E1169 Type 2 diabetes mellitus with other specified complication: Secondary | ICD-10-CM | POA: Diagnosis not present

## 2020-04-10 DIAGNOSIS — E669 Obesity, unspecified: Secondary | ICD-10-CM

## 2020-04-10 DIAGNOSIS — E559 Vitamin D deficiency, unspecified: Secondary | ICD-10-CM | POA: Diagnosis not present

## 2020-04-10 DIAGNOSIS — Z6833 Body mass index (BMI) 33.0-33.9, adult: Secondary | ICD-10-CM

## 2020-04-10 DIAGNOSIS — E1159 Type 2 diabetes mellitus with other circulatory complications: Secondary | ICD-10-CM | POA: Diagnosis not present

## 2020-04-10 DIAGNOSIS — I10 Essential (primary) hypertension: Secondary | ICD-10-CM

## 2020-04-11 LAB — HEMOGLOBIN A1C
Est. average glucose Bld gHb Est-mCnc: 131 mg/dL
Hgb A1c MFr Bld: 6.2 % — ABNORMAL HIGH (ref 4.8–5.6)

## 2020-04-11 LAB — INSULIN, RANDOM: INSULIN: 11 u[IU]/mL (ref 2.6–24.9)

## 2020-04-11 NOTE — Progress Notes (Signed)
Chief Complaint:   OBESITY Lindsay Hayden is here to discuss her progress with her obesity treatment plan along with follow-up of her obesity related diagnoses. Lindsay Hayden is on the Category 2 Plan and states she is following her eating plan approximately 70% of the time. Lindsay Hayden states she is riding her stationary bike for 15 minutes 3 times per week.  Today's visit was #: 8 Starting weight: 198 lbs Starting date: 12/13/2019 Today's weight: 186 lbs Today's date: 04/10/2020 Total lbs lost to date: 12 lbs Total lbs lost since last in-office visit: 0  Interim History: Lindsay Hayden says that over the last couple of weeks, she eats food that she is supposed to, but 2-4 times this week she was hungry at breakfast.  She eats extra food and then skips a future meal or eats less and then yo-yo's back and forth between hunger and fullness.  She says she is not consistent with meals or foods on the plan.  Subjective:   1. Hypertension associated with diabetes ([place]) Review: taking medications as instructed, no medication side effects noted, no chest pain on exertion, no dyspnea on exertion, no swelling of ankles.  Lindsay Hayden is on Entresto, Toprol XL, Lasix, and spironolactone per Dr. Radford Pax of Cardiology.  At home, blood pressure runs in the 110s/70s.   BP Readings from Last 3 Encounters:  04/10/20 (!) 136/81  03/27/20 119/76  03/13/20 131/78   2. Vitamin D deficiency Lindsay Hayden's Vitamin D level was 65.1 on 12/13/2019. She is currently taking prescription vitamin D 50,000 IU each week. She denies nausea, vomiting or muscle weakness.    3. Type 2 diabetes mellitus with other specified complication, without long-term current use of insulin (HCC) Medications reviewed. Diabetic ROS: no polyuria or polydipsia, no chest pain, dyspnea or TIA's, no numbness, tingling or pain in extremities.  A1c was 6.6 on 12/13/2019.  Asymptomatic.  No concerns.  She does not check her blood sugar.  Lab Results    Component Value Date   HGBA1C 6.2 (H) 04/10/2020   HGBA1C 6.6 (H) 12/13/2019   Lab Results  Component Value Date   LDLCALC 90 10/07/2019   CREATININE 0.96 12/13/2019   Lab Results  Component Value Date   INSULIN 11.0 04/10/2020   INSULIN 25.6 (H) 12/13/2019   Assessment/Plan:   1. Hypertension associated with diabetes ([place]) Issabella is working on healthy weight loss and exercise to improve blood pressure control. We will watch for signs of hypotension as she continues her lifestyle modifications.  Management per Cardiology.  At goal currently.  2. Vitamin D deficiency Low Vitamin D level contributes to fatigue and are associated with obesity, breast, and colon cancer. She agrees to continue to take prescription Vitamin D @50 ,000 IU every week and will follow-up for routine testing of Vitamin D, at least 2-3 times per year to avoid over-replacement.    3. Type 2 diabetes mellitus with other specified complication, without long-term current use of insulin (HCC) Good blood sugar control is important to decrease the likelihood of diabetic complications such as nephropathy, neuropathy, limb loss, blindness, coronary artery disease, and death. Intensive lifestyle modification including diet, exercise and weight loss are the first line of treatment for diabetes.  Check labs today (fasting insulin and A1c).  She wants to avoid diabetes medications at all costs.  She was told to follow-up with her PCP for management of her chronic conditions including diabetes.  Continue prudent nutritional plan, weight loss. - Hemoglobin A1c - Insulin, random  4. Class  1 obesity with serious comorbidity and body mass index (BMI) of 33.0 to 33.9 in adult, unspecified obesity type Lindsay Hayden is currently in the action stage of change. As such, her goal is to continue with weight loss efforts. She has agreed to the Category 2 Plan +100 calories.   If at next office visit, hunger is not better along with  achieving weight loss with adding 100 calories (10:1 ratio) then consider starting metformin.  Exercise goals: As is.  Behavioral modification strategies: increasing lean protein intake and increasing water intake (3 bottles per day).  Lindsay Hayden has agreed to follow-up with our clinic in 2 weeks. She was informed of the importance of frequent follow-up visits to maximize her success with intensive lifestyle modifications for her multiple health conditions.   Lindsay Hayden was informed we would discuss her lab results at her next visit unless there is a critical issue that needs to be addressed sooner. Lindsay Hayden agreed to keep her next visit at the agreed upon time to discuss these results.  Objective:   Blood pressure (!) 136/81, pulse 57, temperature 97.9 F (36.6 C), height 5\' 3"  (1.6 m), weight 186 lb (84.4 kg), last menstrual period 07/16/2017, SpO2 96 %. Body mass index is 32.95 kg/m.  General: Cooperative, alert, well developed, in no acute distress. HEENT: Conjunctivae and lids unremarkable. Cardiovascular: Regular rhythm.  Lungs: Normal work of breathing. Neurologic: No focal deficits.   Lab Results  Component Value Date   CREATININE 0.96 12/13/2019   BUN 20 12/13/2019   NA 141 12/13/2019   K 4.6 12/13/2019   CL 106 12/13/2019   CO2 22 12/13/2019   Lab Results  Component Value Date   ALT 18 12/13/2019   AST 15 12/13/2019   ALKPHOS 96 12/13/2019   BILITOT 0.6 12/13/2019   Lab Results  Component Value Date   HGBA1C 6.2 (H) 04/10/2020   HGBA1C 6.6 (H) 12/13/2019   Lab Results  Component Value Date   INSULIN 11.0 04/10/2020   INSULIN 25.6 (H) 12/13/2019   Lab Results  Component Value Date   TSH 1.790 12/13/2019   Lab Results  Component Value Date   CHOL 149 10/07/2019   HDL 46 10/07/2019   LDLCALC 90 10/07/2019   TRIG 62 10/07/2019   CHOLHDL 3.2 10/07/2019   Lab Results  Component Value Date   WBC 5.0 12/13/2019   HGB 13.6 12/13/2019   HCT 42.4  12/13/2019   MCV 87 12/13/2019   PLT 296 12/13/2019   Attestation Statements:   Reviewed by clinician on day of visit: allergies, medications, problem list, medical history, surgical history, family history, social history, and previous encounter notes.  Time spent on visit including pre-visit chart review and post-visit care and charting was 27 minutes.   I, Water quality scientist, CMA, am acting as Location manager for [employer], DO.  I have reviewed the above documentation for accuracy and completeness, and I agree with the above. Mellody Dance, DO

## 2020-04-25 ENCOUNTER — Encounter (INDEPENDENT_AMBULATORY_CARE_PROVIDER_SITE_OTHER): Payer: Self-pay | Admitting: Family Medicine

## 2020-04-25 ENCOUNTER — Ambulatory Visit (INDEPENDENT_AMBULATORY_CARE_PROVIDER_SITE_OTHER): Payer: POS | Admitting: Family Medicine

## 2020-04-25 ENCOUNTER — Other Ambulatory Visit: Payer: Self-pay

## 2020-04-25 VITALS — BP 119/79 | HR 65 | Temp 98.3°F | Ht 63.0 in | Wt 185.0 lb

## 2020-04-25 DIAGNOSIS — E1159 Type 2 diabetes mellitus with other circulatory complications: Secondary | ICD-10-CM

## 2020-04-25 DIAGNOSIS — I10 Essential (primary) hypertension: Secondary | ICD-10-CM | POA: Diagnosis not present

## 2020-04-25 DIAGNOSIS — E669 Obesity, unspecified: Secondary | ICD-10-CM

## 2020-04-25 DIAGNOSIS — E1165 Type 2 diabetes mellitus with hyperglycemia: Secondary | ICD-10-CM

## 2020-04-25 DIAGNOSIS — I152 Hypertension secondary to endocrine disorders: Secondary | ICD-10-CM

## 2020-04-25 DIAGNOSIS — Z6832 Body mass index (BMI) 32.0-32.9, adult: Secondary | ICD-10-CM

## 2020-04-27 NOTE — Progress Notes (Signed)
Chief Complaint:   OBESITY Lindsay Hayden is here to discuss her progress with her obesity treatment plan along with follow-up of her obesity related diagnoses. Lindsay Hayden is on the Category 2 Plan + 100 calories Lindsay states she is following her eating plan approximately 85% of the time. Lindsay Hayden states she is riding the stationary bike for 15 minutes 3 times per week.  Today's visit was #: 9 Starting weight: 198 lbs Starting date: 12/13/2019 Today's weight: 185 lbs Today's date: 04/26/2020 Total lbs lost to date: 13 Total lbs lost since last in-office visit: 1  Interim History: Lindsay Hayden had tried eating Lean Cuisine bowl for dinner with 8 oz of milk Lindsay that helped control the urge to eat extra 100 calories. She occasionally is getting 6 oz of meat at dinner. She is drinking [employer] milk.  Subjective:   1. Hypertension associated with diabetes (Lindsay Hayden) Lindsay Hayden denies chest pain, chest pressure, headaches, dizziness, or lightheadedness. She is on aldactone, entresto, Lindsay toprol XL.  2. Type 2 diabetes mellitus with hyperglycemia, without long-term current use of insulin (Lindsay Hayden) Lindsay Hayden's last A1c was 6.2 Lindsay insulin 11.0 (improved from previously). She is not on medications but she voiced previously she'd like to avoid medications.  Assessment/Plan:   1. Hypertension associated with diabetes (Lindsay Hayden) Lindsay Hayden will continue her current medications, Lindsay will continue working on healthy weight Hayden Lindsay exercise to improve blood pressure control. We will watch for signs of hypotension as she continues her lifestyle modifications.   2. Type 2 diabetes mellitus with hyperglycemia, without long-term current use of insulin (Lindsay Hayden) Lindsay Hayden, Lindsay Hayden, Lindsay Hayden, Lindsay Hayden, Lindsay Hayden, Lindsay death. Intensive lifestyle modification including diet, exercise Lindsay weight Hayden are the  first line of treatment for diabetes. We will repeat labs in 3 months, Lindsay Hayden will follow up as directed.  3. Class 1 obesity with serious comorbidity Lindsay body mass index (BMI) of 32.0 to 32.9 in adult, unspecified obesity type Lindsay Hayden is currently in the action stage of change. As such, her goal is to continue with weight Hayden efforts. She has agreed to the Category 2 Plan with a glass of milk with dinner.   Exercise goals: Increase riding the stationary bike to 4 times per week.  Behavioral modification strategies: increasing lean protein intake, meal planning Lindsay cooking strategies Lindsay keeping healthy foods in the home.  Lindsay Hayden has agreed to follow-up with our clinic in 2 to 3 weeks. She was informed of the importance of frequent follow-up visits to maximize her success with intensive lifestyle modifications for her multiple health conditions.   Objective:   Blood pressure 119/79, pulse 65, temperature 98.3 F (36.8 C), temperature source Oral, height 5\' 3"  (1.6 m), weight 185 lb (83.9 kg), last menstrual period 07/16/2017, SpO2 97 %. Body mass index is 32.77 kg/m.  General: Cooperative, alert, well developed, in no acute distress. HEENT: Conjunctivae Lindsay lids unremarkable. Cardiovascular: Regular rhythm.  Lungs: Normal work of breathing. Neurologic: No focal deficits.   Lab Results  Component Value Date   CREATININE 0.96 12/13/2019   BUN 20 12/13/2019   NA 141 12/13/2019   K 4.6 12/13/2019   CL 106 12/13/2019   CO2 22 12/13/2019   Lab Results  Component Value Date   ALT 18 12/13/2019   AST 15 12/13/2019   ALKPHOS 96 12/13/2019   BILITOT 0.6 12/13/2019   Lab Results  Component Value Date  HGBA1C 6.2 (H) 04/10/2020   HGBA1C 6.6 (H) 12/13/2019   Lab Results  Component Value Date   INSULIN 11.0 04/10/2020   INSULIN 25.6 (H) 12/13/2019   Lab Results  Component Value Date   TSH 1.790 12/13/2019   Lab Results  Component Value Date   CHOL 149 10/07/2019    HDL 46 10/07/2019   LDLCALC 90 10/07/2019   TRIG 62 10/07/2019   CHOLHDL 3.2 10/07/2019   Lab Results  Component Value Date   WBC 5.0 12/13/2019   HGB 13.6 12/13/2019   HCT 42.4 12/13/2019   MCV 87 12/13/2019   PLT 296 12/13/2019   No results found for: IRON, TIBC, FERRITIN  Attestation Statements:   Reviewed by clinician on day of visit: allergies, medications, problem list, medical history, surgical history, family history, social history, Lindsay previous encounter notes.   I, Trixie Dredge, am acting as transcriptionist for Coralie Common, MD.  I have reviewed the above documentation for accuracy Lindsay completeness, Lindsay I agree with the above. - Jinny Blossom, MD

## 2020-05-08 ENCOUNTER — Other Ambulatory Visit: Payer: Self-pay

## 2020-05-08 ENCOUNTER — Ambulatory Visit (INDEPENDENT_AMBULATORY_CARE_PROVIDER_SITE_OTHER): Payer: POS | Admitting: Family Medicine

## 2020-05-08 ENCOUNTER — Encounter (INDEPENDENT_AMBULATORY_CARE_PROVIDER_SITE_OTHER): Payer: Self-pay | Admitting: Family Medicine

## 2020-05-08 VITALS — BP 121/69 | HR 59 | Temp 97.8°F | Ht 63.0 in | Wt 181.0 lb

## 2020-05-08 DIAGNOSIS — E669 Obesity, unspecified: Secondary | ICD-10-CM | POA: Diagnosis not present

## 2020-05-08 DIAGNOSIS — E1159 Type 2 diabetes mellitus with other circulatory complications: Secondary | ICD-10-CM

## 2020-05-08 DIAGNOSIS — I152 Hypertension secondary to endocrine disorders: Secondary | ICD-10-CM

## 2020-05-08 DIAGNOSIS — Z6832 Body mass index (BMI) 32.0-32.9, adult: Secondary | ICD-10-CM

## 2020-05-08 DIAGNOSIS — I10 Essential (primary) hypertension: Secondary | ICD-10-CM

## 2020-05-08 DIAGNOSIS — E1165 Type 2 diabetes mellitus with hyperglycemia: Secondary | ICD-10-CM

## 2020-05-09 NOTE — Progress Notes (Signed)
Chief Complaint:   OBESITY Lindsay Hayden is here to discuss her progress with her obesity treatment plan along with follow-up of her obesity related diagnoses. Lindsay Hayden is on the Category 2 Plan and states she is following her eating plan approximately 85-98% of the time. Lindsay Hayden states she is riding the stationary bike for 15 minutes 4 times per week.  Today's visit was #: 10 Starting weight: 198 lbs Starting date: 12/13/2019 Today's weight: 181 lbs Today's date: 05/08/2020 Total lbs lost to date: 17 Total lbs lost since last in-office visit: 4  Interim History: Lindsay Hayden is still following the meal plan most of the time. Previously dinner has been the hardest to follow but now maybe finding variety in lunch seems to be the biggest obstacle. She does not forsee any obstacles in the upcoming weeks.  Subjective:   1. Type 2 diabetes mellitus with hyperglycemia, without long-term current use of insulin (HCC) Lindsay Hayden's last A1c was 6.2, and she is not on any medications.  2. Hypertension associated with diabetes (Lindsay Hayden) Lindsay Hayden's blood pressure is well controlled today. She denies dizziness, lightheadedness, chest pain, chest pressure, or headache.  Assessment/Plan:   1. Type 2 diabetes mellitus with hyperglycemia, without long-term current use of insulin (HCC) Good blood sugar control is important to decrease the likelihood of diabetic complications such as nephropathy, neuropathy, limb loss, blindness, coronary artery disease, and death. Intensive lifestyle modification including diet, exercise and weight loss are the first line of treatment for diabetes. We will repeat labs at the end of November, and Lindsay Hayden will continue to follow up as directed.   2. Hypertension associated with diabetes (Lindsay Hayden) Lindsay Hayden is working on healthy weight loss and exercise to improve blood pressure control. We will watch for signs of hypotension as she continues her lifestyle modifications.  We will follow up on her blood pressure at her next appointment.  3. Class 1 obesity with serious comorbidity and body mass index (BMI) of 32.0 to 32.9 in adult, unspecified obesity type Lindsay Hayden is currently in the action stage of change. As such, her goal is to continue with weight loss efforts. She has agreed to the Category 2 Plan with lunch options.   Exercise goals: Increase stationary bike to 20-25 minutes 4 times per week.  Behavioral modification strategies: increasing lean protein intake, increasing vegetables, meal planning and cooking strategies, keeping healthy foods in the home and planning for success.  Lindsay Hayden has agreed to follow-up with our clinic in 2 to 3 weeks. She was informed of the importance of frequent follow-up visits to maximize her success with intensive lifestyle modifications for her multiple health conditions.   Objective:   Blood pressure 121/69, pulse (!) 59, temperature 97.8 F (36.6 C), temperature source Oral, height 5\' 3"  (1.6 m), weight 181 lb (82.1 kg), last menstrual period 07/16/2017, SpO2 98 %. Body mass index is 32.06 kg/m.  General: Cooperative, alert, well developed, in no acute distress. HEENT: Conjunctivae and lids unremarkable. Cardiovascular: Regular rhythm.  Lungs: Normal work of breathing. Neurologic: No focal deficits.   Lab Results  Component Value Date   CREATININE 0.96 12/13/2019   BUN 20 12/13/2019   NA 141 12/13/2019   K 4.6 12/13/2019   CL 106 12/13/2019   CO2 22 12/13/2019   Lab Results  Component Value Date   ALT 18 12/13/2019   AST 15 12/13/2019   ALKPHOS 96 12/13/2019   BILITOT 0.6 12/13/2019   Lab Results  Component Value Date   HGBA1C 6.2 (H)  04/10/2020   HGBA1C 6.6 (H) 12/13/2019   Lab Results  Component Value Date   INSULIN 11.0 04/10/2020   INSULIN 25.6 (H) 12/13/2019   Lab Results  Component Value Date   TSH 1.790 12/13/2019   Lab Results  Component Value Date   CHOL 149 10/07/2019   HDL  46 10/07/2019   LDLCALC 90 10/07/2019   TRIG 62 10/07/2019   CHOLHDL 3.2 10/07/2019   Lab Results  Component Value Date   WBC 5.0 12/13/2019   HGB 13.6 12/13/2019   HCT 42.4 12/13/2019   MCV 87 12/13/2019   PLT 296 12/13/2019   No results found for: IRON, TIBC, FERRITIN  Attestation Statements:   Reviewed by clinician on day of visit: allergies, medications, problem list, medical history, surgical history, family history, social history, and previous encounter notes.  Time spent on visit including pre-visit chart review and post-visit care and charting was 15 minutes.    I, Trixie Dredge, am acting as transcriptionist for Lindsay Common, MD.  I have reviewed the above documentation for accuracy and completeness, and I agree with the above. - Jinny Blossom, MD

## 2020-05-13 LAB — COLOGUARD: COLOGUARD: NEGATIVE

## 2020-05-13 LAB — EXTERNAL GENERIC LAB PROCEDURE: COLOGUARD: NEGATIVE

## 2020-05-29 ENCOUNTER — Encounter (INDEPENDENT_AMBULATORY_CARE_PROVIDER_SITE_OTHER): Payer: Self-pay | Admitting: Family Medicine

## 2020-05-29 ENCOUNTER — Other Ambulatory Visit: Payer: Self-pay

## 2020-05-29 ENCOUNTER — Ambulatory Visit (INDEPENDENT_AMBULATORY_CARE_PROVIDER_SITE_OTHER): Payer: POS | Admitting: Family Medicine

## 2020-05-29 VITALS — BP 120/75 | HR 59 | Temp 98.1°F | Ht 63.0 in | Wt 182.0 lb

## 2020-05-29 DIAGNOSIS — Z6832 Body mass index (BMI) 32.0-32.9, adult: Secondary | ICD-10-CM | POA: Diagnosis not present

## 2020-05-29 DIAGNOSIS — E669 Obesity, unspecified: Secondary | ICD-10-CM | POA: Diagnosis not present

## 2020-05-29 DIAGNOSIS — E7849 Other hyperlipidemia: Secondary | ICD-10-CM

## 2020-05-29 NOTE — Progress Notes (Signed)
Chief Complaint:   OBESITY Lindsay Hayden is here to discuss her progress with her obesity treatment plan along with follow-up of her obesity related diagnoses. Lindsay Hayden is on the Category 2 Plan with lunch options and states she is following her eating plan approximately 85% of the time. Lindsay Hayden states she is riding the stationary bike for  25 minutes 2 times per week.  Today's visit was #: 11 Starting weight: 198 lbs Starting date: 12/13/2019 Today's weight: 182 lbs Today's date: 05/29/2020 Total lbs lost to date: 165 Total lbs lost since last in-office visit: 0  Interim History: Lindsay Hayden has done well maintaining her weight. She is up 1 lb of water weight. She was given meal choices for lunch and she likes the extra freedom this gives her.  Subjective:   1. Other hyperlipidemia Lindsay Hayden is working on diet and exercise to improve her cholesterol. She is stable on Lipitor, and she denies chest pain or myalgias.  Assessment/Plan:   1. Other hyperlipidemia Cardiovascular risk and specific lipid/LDL goals reviewed. We discussed several lifestyle modifications today. [name] will continue Lipitor, and will continue to work on diet, exercise and weight loss efforts. We will recheck labs in 1-2 months. Orders and follow up as documented in patient record.   2. Class 1 obesity with serious comorbidity and body mass index (BMI) of 32.0 to 32.9 in adult, unspecified obesity type Lindsay Hayden is currently in the action stage of change. As such, her goal is to continue with weight loss efforts. She has agreed to the Category 2 Plan and keeping a food journal and adhering to recommended goals of 350-500 calories and 35 grams of protein at supper daily.   Exercise goals: As is.  Behavioral modification strategies: increasing lean protein intake.  Lindsay Hayden has agreed to follow-up with our clinic in 2 to 3 weeks. She was informed of the importance of frequent follow-up visits to  maximize her success with intensive lifestyle modifications for her multiple health conditions.   Objective:   Blood pressure 120/75, pulse (!) 59, temperature 98.1 F (36.7 C), height 5\' 3"  (1.6 m), weight 182 lb (82.6 kg), last menstrual period 07/16/2017, SpO2 97 %. Body mass index is 32.24 kg/m.  General: Cooperative, alert, well developed, in no acute distress. HEENT: Conjunctivae and lids unremarkable. Cardiovascular: Regular rhythm.  Lungs: Normal work of breathing. Neurologic: No focal deficits.   Lab Results  Component Value Date   CREATININE 0.96 12/13/2019   BUN 20 12/13/2019   NA 141 12/13/2019   K 4.6 12/13/2019   CL 106 12/13/2019   CO2 22 12/13/2019   Lab Results  Component Value Date   ALT 18 12/13/2019   AST 15 12/13/2019   ALKPHOS 96 12/13/2019   BILITOT 0.6 12/13/2019   Lab Results  Component Value Date   HGBA1C 6.2 (H) 04/10/2020   HGBA1C 6.6 (H) 12/13/2019   Lab Results  Component Value Date   INSULIN 11.0 04/10/2020   INSULIN 25.6 (H) 12/13/2019   Lab Results  Component Value Date   TSH 1.790 12/13/2019   Lab Results  Component Value Date   CHOL 149 10/07/2019   HDL 46 10/07/2019   LDLCALC 90 10/07/2019   TRIG 62 10/07/2019   CHOLHDL 3.2 10/07/2019   Lab Results  Component Value Date   WBC 5.0 12/13/2019   HGB 13.6 12/13/2019   HCT 42.4 12/13/2019   MCV 87 12/13/2019   PLT 296 12/13/2019   No results found for: IRON, TIBC,  FERRITIN  Attestation Statements:   Reviewed by clinician on day of visit: allergies, medications, problem list, medical history, surgical history, family history, social history, and previous encounter notes.  Time spent on visit including pre-visit chart review and post-visit care and charting was 20 minutes.    I, Trixie Dredge, am acting as transcriptionist for Dennard Nip, MD.  I have reviewed the above documentation for accuracy and completeness, and I agree with the above. -  Dennard Nip,  MD

## 2020-06-13 ENCOUNTER — Encounter (INDEPENDENT_AMBULATORY_CARE_PROVIDER_SITE_OTHER): Payer: Self-pay | Admitting: Family Medicine

## 2020-06-13 ENCOUNTER — Other Ambulatory Visit: Payer: Self-pay

## 2020-06-13 ENCOUNTER — Ambulatory Visit (INDEPENDENT_AMBULATORY_CARE_PROVIDER_SITE_OTHER): Payer: POS | Admitting: Family Medicine

## 2020-06-13 VITALS — BP 119/67 | HR 70 | Temp 98.4°F | Ht 63.0 in | Wt 183.0 lb

## 2020-06-13 DIAGNOSIS — E1159 Type 2 diabetes mellitus with other circulatory complications: Secondary | ICD-10-CM

## 2020-06-13 DIAGNOSIS — I10 Essential (primary) hypertension: Secondary | ICD-10-CM

## 2020-06-13 DIAGNOSIS — I152 Hypertension secondary to endocrine disorders: Secondary | ICD-10-CM

## 2020-06-13 DIAGNOSIS — E1169 Type 2 diabetes mellitus with other specified complication: Secondary | ICD-10-CM

## 2020-06-13 DIAGNOSIS — E785 Hyperlipidemia, unspecified: Secondary | ICD-10-CM

## 2020-06-13 DIAGNOSIS — E669 Obesity, unspecified: Secondary | ICD-10-CM | POA: Diagnosis not present

## 2020-06-13 DIAGNOSIS — Z6832 Body mass index (BMI) 32.0-32.9, adult: Secondary | ICD-10-CM

## 2020-06-13 NOTE — Progress Notes (Signed)
Chief Complaint:   Lindsay Lindsay is here to discuss her progress with her Lindsay treatment plan along with follow-up of her Lindsay related diagnoses. Lindsay Lindsay is on the Category 2 Plan and keeping a food journal and adhering to recommended goals of 350-500 calories and 35 grams of protein at supper daily and states she is following her eating plan approximately 95% of the time. Lindsay Lindsay states she is riding the stationary bike for 20-25 minutes 4 times per week.  Today's visit was #: 12 Starting weight: 198 lbs Starting date: 12/13/2019 Today's weight: 183 lbs Today's date: 06/13/2020 Total lbs lost to date: 15 Total lbs lost since last in-office visit: 0  Interim History: Lindsay Lindsay's birthday was last week and that was the official start of her vacation. It has been harder to stick to the plan while on vacation, as she doesn't have the same schedule. She has no plans for the upcoming weeks.  Subjective:   1. Hyperlipidemia associated with type 2 diabetes mellitus (Lindsay Lindsay) Lindsay Lindsay's last LDL was 90, HDL 46, and triglycerides 62, and she is on atorvastatin.  2. Hypertension associated with diabetes (Lindsay Lindsay) Lindsay Lindsay's blood pressure is well controlled today. She denies chest pain, chest pressure, or headache.  Assessment/Plan:   1. Hyperlipidemia associated with type 2 diabetes mellitus (Lindsay Lindsay) Cardiovascular risk and specific lipid/LDL goals reviewed. We discussed several lifestyle modifications today and Lindsay Lindsay will continue to work on diet, exercise and weight loss efforts. We will repeat labs at her next appointment. Orders and follow up as documented in patient record.   Counseling Intensive lifestyle modifications are the first line treatment for this issue. . Dietary changes: Increase soluble fiber. Decrease simple carbohydrates. . Exercise changes: Moderate to vigorous-intensity aerobic activity 150 minutes per week if tolerated. . Lipid-lowering medications:  see documented in medical record.  2. Hypertension associated with diabetes (Lindsay Lindsay) Lindsay Lindsay is working on healthy weight loss and exercise to improve blood pressure control. We will watch for signs of hypotension as she continues her lifestyle modifications. Lindsay Lindsay will continue her current medications, no change in dose, and we will repeat CMP at her next appointment.  3. Lindsay Lindsay with serious comorbidity and body mass index (BMI) of 32.0 to 32.9 in adult, unspecified Lindsay type Lindsay Lindsay is currently in the action stage of change. As such, her goal is to continue with weight loss efforts. She has agreed to the Category 2 Plan.   We will repeat IC and labs at her next appointment.  Exercise goals: As is, and she is to start thinking about resistance training.  Behavioral modification strategies: increasing lean protein intake, meal planning and cooking strategies, keeping healthy foods in the home and planning for success.  Lindsay Lindsay has agreed to follow-up with our clinic in 2 to 3 weeks. She was informed of the importance of frequent follow-up visits to maximize her success with intensive lifestyle modifications for her multiple health conditions.   Objective:   Blood pressure 119/67, pulse 70, temperature 98.4 F (36.9 C), temperature source Oral, height 5\' 3"  (1.6 m), weight 183 lb (83 kg), last menstrual period 07/16/2017, SpO2 96 %. Body mass index is 32.42 kg/m.  General: Cooperative, alert, well developed, in no acute distress. HEENT: Conjunctivae and lids unremarkable. Cardiovascular: Regular rhythm.  Lungs: Normal work of breathing. Neurologic: No focal deficits.   Lab Results  Component Value Date   CREATININE 0.96 12/13/2019   BUN 20 12/13/2019   NA 141 12/13/2019   K 4.6 12/13/2019  CL 106 12/13/2019   CO2 22 12/13/2019   Lab Results  Component Value Date   ALT 18 12/13/2019   AST 15 12/13/2019   ALKPHOS 96 12/13/2019   BILITOT 0.6 12/13/2019     Lab Results  Component Value Date   HGBA1C 6.2 (H) 04/10/2020   HGBA1C 6.6 (H) 12/13/2019   Lab Results  Component Value Date   INSULIN 11.0 04/10/2020   INSULIN 25.6 (H) 12/13/2019   Lab Results  Component Value Date   TSH 1.790 12/13/2019   Lab Results  Component Value Date   CHOL 149 10/07/2019   HDL 46 10/07/2019   LDLCALC 90 10/07/2019   TRIG 62 10/07/2019   CHOLHDL 3.2 10/07/2019   Lab Results  Component Value Date   WBC 5.0 12/13/2019   HGB 13.6 12/13/2019   HCT 42.4 12/13/2019   MCV 87 12/13/2019   PLT 296 12/13/2019   No results found for: IRON, TIBC, FERRITIN  Attestation Statements:   Reviewed by clinician on day of visit: allergies, medications, problem list, medical history, surgical history, family history, social history, and previous encounter notes.  Time spent on visit including pre-visit chart review and post-visit care and charting was 15 minutes.    I, Trixie Dredge, am acting as transcriptionist for Coralie Common, MD.  I have reviewed the above documentation for accuracy and completeness, and I agree with the above. - Jinny Blossom, MD

## 2020-07-04 ENCOUNTER — Other Ambulatory Visit: Payer: Self-pay

## 2020-07-04 ENCOUNTER — Encounter (INDEPENDENT_AMBULATORY_CARE_PROVIDER_SITE_OTHER): Payer: Self-pay | Admitting: Family Medicine

## 2020-07-04 ENCOUNTER — Ambulatory Visit (INDEPENDENT_AMBULATORY_CARE_PROVIDER_SITE_OTHER): Payer: POS | Admitting: Family Medicine

## 2020-07-04 VITALS — BP 114/73 | HR 58 | Temp 98.0°F | Ht 63.0 in | Wt 184.0 lb

## 2020-07-04 DIAGNOSIS — E1169 Type 2 diabetes mellitus with other specified complication: Secondary | ICD-10-CM | POA: Diagnosis not present

## 2020-07-04 DIAGNOSIS — E669 Obesity, unspecified: Secondary | ICD-10-CM | POA: Diagnosis not present

## 2020-07-04 DIAGNOSIS — R5383 Other fatigue: Secondary | ICD-10-CM

## 2020-07-04 DIAGNOSIS — E785 Hyperlipidemia, unspecified: Secondary | ICD-10-CM | POA: Diagnosis not present

## 2020-07-04 DIAGNOSIS — Z9189 Other specified personal risk factors, not elsewhere classified: Secondary | ICD-10-CM

## 2020-07-04 DIAGNOSIS — Z6832 Body mass index (BMI) 32.0-32.9, adult: Secondary | ICD-10-CM

## 2020-07-11 NOTE — Progress Notes (Signed)
Chief Complaint:   OBESITY Lindsay Hayden is here to discuss her progress with her obesity treatment plan along with follow-up of her obesity related diagnoses. Lindsay Hayden is on the Category 2 Plan and states she is following her eating plan approximately 60% of the time. Lindsay Hayden states she is exercising 0 minutes 0 times per week.  Today's visit was #: 13 Starting weight: 198 lbs Starting date: 12/13/2019 Today's weight: 184 lbs Today's date: 07/04/2020 Total lbs lost to date: 14 Total lbs lost since last in-office visit: 0  Interim History: Lindsay Hayden is getting tired of eggs and so last week she voices that she has been doing zucchini pancakes with [place] bacon with no toppings on the pancakes. She reports doing a sandwich at lunch and apple. Dinner is meat and vegetables with some rice. She is not weighing meat at lunch except for the sandwich. The biggest obstacle in the upcoming weeks is getting exercise in.  Subjective:   Other fatigue. Lindsay Hayden reports fatigue is unchanged from her first visit. She is struggling with weight gain.  Hyperlipidemia associated with type 2 diabetes mellitus ([place]). LDL 90, HDL 46, and triglycerides 62 on 10/07/2019. Lindsay Hayden is on a statin.  Lab Results  Component Value Date   CHOL 149 10/07/2019   HDL 46 10/07/2019   LDLCALC 90 10/07/2019   TRIG 62 10/07/2019   CHOLHDL 3.2 10/07/2019   Lab Results  Component Value Date   ALT 18 12/13/2019   AST 15 12/13/2019   ALKPHOS 96 12/13/2019   BILITOT 0.6 12/13/2019   The 10-year ASCVD risk score [name], et al., 2013) is: 8%   Values used to calculate the score:     Age: 59 years     Sex: Female     Is Non-Hispanic African American: Yes     Diabetic: Yes     Tobacco smoker: No     Systolic Blood Pressure: 124 mmHg     Is BP treated: Yes     HDL Cholesterol: 46 mg/dL     Total Cholesterol: 149 mg/dL  At risk for heart disease. [name] is at a higher than average  risk for cardiovascular disease due to obesity.   Assessment/Plan:   Other fatigue. Fatigue may be related to obesity, depression or many other causes. Labs will be ordered, and in the meanwhile, [name] will focus on self care including making healthy food choices, increasing physical activity and focusing on stress reduction. IC was performed revealing an RMR of 1535.  Hyperlipidemia associated with type 2 diabetes mellitus ([place]). Cardiovascular risk and specific lipid/LDL goals reviewed.  We discussed several lifestyle modifications today and [name] will continue to work on diet, exercise and weight loss efforts. Orders and follow up as documented in patient record. Labs will be repeated in January 2022.  Counseling Intensive lifestyle modifications are the first line treatment for this issue. . Dietary changes: Increase soluble fiber. Decrease simple carbohydrates. . Exercise changes: Moderate to vigorous-intensity aerobic activity 150 minutes per week if tolerated. . Lipid-lowering medications: see documented in medical record.  At risk for heart disease. [name] was given approximately 15 minutes of coronary artery disease prevention counseling today. She is 59 y.o. female and has risk factors for heart disease including obesity. We discussed intensive lifestyle modifications today with an emphasis on specific weight loss instructions and strategies.   Repetitive spaced learning was employed today to elicit superior memory formation and behavioral change.  Class 1 obesity with serious  comorbidity and body mass index (BMI) of 32.0 to 32.9 in adult, unspecified obesity type.  [name] is currently in the action stage of change. As such, her goal is to continue with weight loss efforts. She has agreed to the Category 2 Plan with the Category 3 Plan for breakfast.   Exercise goals: All adults should avoid inactivity. Some physical activity is better than none, and adults who  participate in any amount of physical activity gain some health benefits.  Behavioral modification strategies: increasing lean protein intake, meal planning and cooking strategies and planning for success.  [name] has agreed to follow-up with our clinic in 2 weeks. She was informed of the importance of frequent follow-up visits to maximize her success with intensive lifestyle modifications for her multiple health conditions.   Objective:   Blood pressure 114/73, pulse (!) 58, temperature 98 F (36.7 C), temperature source Oral, height 5\' 3"  (1.6 m), weight 184 lb (83.5 kg), last menstrual period 07/16/2017, SpO2 98 %. Body mass index is 32.59 kg/m.  General: Cooperative, alert, well developed, in no acute distress. HEENT: Conjunctivae and lids unremarkable. Cardiovascular: Regular rhythm.  Lungs: Normal work of breathing. Neurologic: No focal deficits.   Lab Results  Component Value Date   CREATININE 0.96 12/13/2019   BUN 20 12/13/2019   NA 141 12/13/2019   K 4.6 12/13/2019   CL 106 12/13/2019   CO2 22 12/13/2019   Lab Results  Component Value Date   ALT 18 12/13/2019   AST 15 12/13/2019   ALKPHOS 96 12/13/2019   BILITOT 0.6 12/13/2019   Lab Results  Component Value Date   HGBA1C 6.2 (H) 04/10/2020   HGBA1C 6.6 (H) 12/13/2019   Lab Results  Component Value Date   INSULIN 11.0 04/10/2020   INSULIN 25.6 (H) 12/13/2019   Lab Results  Component Value Date   TSH 1.790 12/13/2019   Lab Results  Component Value Date   CHOL 149 10/07/2019   HDL 46 10/07/2019   LDLCALC 90 10/07/2019   TRIG 62 10/07/2019   CHOLHDL 3.2 10/07/2019   Lab Results  Component Value Date   WBC 5.0 12/13/2019   HGB 13.6 12/13/2019   HCT 42.4 12/13/2019   MCV 87 12/13/2019   PLT 296 12/13/2019   No results found for: IRON, TIBC, FERRITIN  Attestation Statements:   Reviewed by clinician on day of visit: allergies, medications, problem list, medical history, surgical history,  family history, social history, and previous encounter notes.  I, Michaelene Song, am acting as transcriptionist for Coralie Common, MD   I have reviewed the above documentation for accuracy and completeness, and I agree with the above. - Jinny Blossom, MD

## 2020-07-25 ENCOUNTER — Encounter (INDEPENDENT_AMBULATORY_CARE_PROVIDER_SITE_OTHER): Payer: Self-pay | Admitting: Family Medicine

## 2020-07-25 ENCOUNTER — Ambulatory Visit (INDEPENDENT_AMBULATORY_CARE_PROVIDER_SITE_OTHER): Payer: POS | Admitting: Family Medicine

## 2020-07-25 ENCOUNTER — Other Ambulatory Visit: Payer: Self-pay

## 2020-07-25 VITALS — BP 118/72 | HR 69 | Temp 98.5°F | Ht 63.0 in | Wt 186.0 lb

## 2020-07-25 DIAGNOSIS — Z6832 Body mass index (BMI) 32.0-32.9, adult: Secondary | ICD-10-CM | POA: Diagnosis not present

## 2020-07-25 DIAGNOSIS — I5040 Unspecified combined systolic (congestive) and diastolic (congestive) heart failure: Secondary | ICD-10-CM

## 2020-07-25 DIAGNOSIS — E669 Obesity, unspecified: Secondary | ICD-10-CM

## 2020-07-25 DIAGNOSIS — E559 Vitamin D deficiency, unspecified: Secondary | ICD-10-CM | POA: Diagnosis not present

## 2020-07-26 NOTE — Progress Notes (Signed)
Chief Complaint:   OBESITY Lindsay Hayden is here to discuss her progress with her obesity treatment plan along with follow-up of her obesity related diagnoses. Lindsay Hayden is on the Category 2 Plan with the Category 3 Plan breakfast options and states she is following her eating plan approximately 90% of the time. Lindsay Hayden states she is doing 0 minutes 0 times per week.  Today's visit was #: 14 Starting weight: 198 lbs Starting date: 12/13/2019 Today's weight: 186 lbs Today's date: 07/25/2020 Total lbs lost to date: 12 Total lbs lost since last in-office visit: 0  Interim History: Lindsay Hayden has been following the Category 3, she did eggs, milk (did try to make [place] toast), or egg and cheese option. She is doing meat and vegetable at dinner (closer to 10 oz) occasionally with rice. Snacks are ice cream bars, crackers, and cheese with occasional almonds (100 calories per pack). She is hoping to have Thanksgiving off.  Subjective:   1. Vitamin D deficiency Lindsay Hayden is not on prescription Vit D, but she is on OTC Vit D. She notes fatigue. Last Vit D level was 65.1.  2. Combined systolic and diastolic congestive heart failure, unspecified HF chronicity (HCC) Lindsay Hayden sees Dr. Radford Pax. She is on congestive heart failure cocktail of medications.  Assessment/Plan:   1. Vitamin D deficiency Low Vitamin D level contributes to fatigue and are associated with obesity, breast, and colon cancer. Lindsay Hayden agreed to continue taking OTC Vitamin D and will follow-up for routine testing of Vitamin D, at least 2-3 times per year to avoid over-replacement.  2. Combined systolic and diastolic congestive heart failure, unspecified HF chronicity (HCC) Lindsay Hayden will continue to follow up with Dr. Radford Pax.  3. Class 1 obesity with serious comorbidity and body mass index (BMI) of 32.0 to 32.9 in adult, unspecified obesity type Lindsay Hayden is currently in the action stage of change. As such, her goal  is to continue with weight loss efforts. She has agreed to the Category 2 Plan + 100 calories.   Lindsay Hayden doesn't want to try GLP-1. She will decrease her meal plan to Category 2 + 100 calories. We may need to re-discuss medication initiation if scale doesn't move.  Exercise goals: Start 10 minutes 3 times per week on the bike.  Behavioral modification strategies: increasing lean protein intake, meal planning and cooking strategies, keeping healthy foods in the home and planning for success.  Lindsay Hayden has agreed to follow-up with our clinic in 2 weeks with [facility], FNP-C. She was informed of the importance of frequent follow-up visits to maximize her success with intensive lifestyle modifications for her multiple health conditions.   Objective:   Blood pressure 118/72, pulse 69, temperature 98.5 F (36.9 C), temperature source Oral, height 5\' 3"  (1.6 m), weight 186 lb (84.4 kg), last menstrual period 07/16/2017, SpO2 98 %. Body mass index is 32.95 kg/m.  General: Cooperative, alert, well developed, in no acute distress. HEENT: Conjunctivae and lids unremarkable. Cardiovascular: Regular rhythm.  Lungs: Normal work of breathing. Neurologic: No focal deficits.   Lab Results  Component Value Date   CREATININE 0.96 12/13/2019   BUN 20 12/13/2019   NA 141 12/13/2019   K 4.6 12/13/2019   CL 106 12/13/2019   CO2 22 12/13/2019   Lab Results  Component Value Date   ALT 18 12/13/2019   AST 15 12/13/2019   ALKPHOS 96 12/13/2019   BILITOT 0.6 12/13/2019   Lab Results  Component Value Date   HGBA1C 6.2 (H) 04/10/2020  HGBA1C 6.6 (H) 12/13/2019   Lab Results  Component Value Date   INSULIN 11.0 04/10/2020   INSULIN 25.6 (H) 12/13/2019   Lab Results  Component Value Date   TSH 1.790 12/13/2019   Lab Results  Component Value Date   CHOL 149 10/07/2019   HDL 46 10/07/2019   LDLCALC 90 10/07/2019   TRIG 62 10/07/2019   CHOLHDL 3.2 10/07/2019   Lab Results    Component Value Date   WBC 5.0 12/13/2019   HGB 13.6 12/13/2019   HCT 42.4 12/13/2019   MCV 87 12/13/2019   PLT 296 12/13/2019   No results found for: IRON, TIBC, FERRITIN  Attestation Statements:   Reviewed by clinician on day of visit: allergies, medications, problem list, medical history, surgical history, family history, social history, and previous encounter notes.   I, Trixie Dredge, am acting as transcriptionist for Coralie Common, MD.  I have reviewed the above documentation for accuracy and completeness, and I agree with the above. - Jinny Blossom, MD

## 2020-08-07 ENCOUNTER — Encounter (INDEPENDENT_AMBULATORY_CARE_PROVIDER_SITE_OTHER): Payer: Self-pay | Admitting: Family Medicine

## 2020-08-07 ENCOUNTER — Other Ambulatory Visit: Payer: Self-pay

## 2020-08-07 ENCOUNTER — Ambulatory Visit (INDEPENDENT_AMBULATORY_CARE_PROVIDER_SITE_OTHER): Payer: POS | Admitting: Family Medicine

## 2020-08-07 VITALS — BP 133/72 | HR 99 | Temp 98.0°F | Ht 63.0 in | Wt 185.0 lb

## 2020-08-07 DIAGNOSIS — Z6832 Body mass index (BMI) 32.0-32.9, adult: Secondary | ICD-10-CM | POA: Diagnosis not present

## 2020-08-07 DIAGNOSIS — E669 Obesity, unspecified: Secondary | ICD-10-CM | POA: Diagnosis not present

## 2020-08-07 DIAGNOSIS — I5040 Unspecified combined systolic (congestive) and diastolic (congestive) heart failure: Secondary | ICD-10-CM | POA: Diagnosis not present

## 2020-08-07 DIAGNOSIS — E1169 Type 2 diabetes mellitus with other specified complication: Secondary | ICD-10-CM

## 2020-08-08 NOTE — Progress Notes (Signed)
Chief Complaint:   OBESITY Lindsay Hayden is here to discuss her progress with her obesity treatment plan along with follow-up of her obesity related diagnoses. [name] is on the Category 2 Plan + 100 calories and states she is following her eating plan approximately 90% of the time. Zeenat states she is doing cardio for 10 minutes 3 times per week.  Today's visit was #: 15 Starting weight: 198 lbs Starting date: 12/13/2019 Today's weight: 185 lbs Today's date: 08/07/2020 Total lbs lost to date: 13 Total lbs lost since last in-office visit: 1  Interim History: Lindsay Hayden is on the plan at breakfast and lunch. Dinner is harder for her. She tries to meal prep on her days off and eat leftovers. Her boyfriend also cooks on his days off.  Subjective:   1. Combined systolic and diastolic congestive heart failure, unspecified HF chronicity ([place]) [name] feels her congestive heart failure was caused by drinking regular soda. Her congestive heart failure is stable. She is compliant with her medications. She sees Dr. Radford Pax.  2. Type 2 diabetes mellitus with other specified complication, without long-term current use of insulin ([place]) Lindsay Hayden's diabetes mellitus is diet controlled. Last A1c was 6.2, which is down from 6.6 on 12/13/2019.  Lab Results  Component Value Date   HGBA1C 6.2 (H) 04/10/2020   HGBA1C 6.6 (H) 12/13/2019   Lab Results  Component Value Date   LDLCALC 90 10/07/2019   CREATININE 0.96 12/13/2019   Lab Results  Component Value Date   INSULIN 11.0 04/10/2020   INSULIN 25.6 (H) 12/13/2019   Assessment/Plan:   1. Combined systolic and diastolic congestive heart failure, unspecified HF chronicity (HCC) [name] will continue to follow up with Cardiology every 6 months as directed.  2. Type 2 diabetes mellitus with other specified complication, without long-term current use of insulin ([name])  Lindsay Hayden will continue her meal plan, and will continue to  follow up as directed.  3. Class 1 obesity with serious comorbidity and body mass index (BMI) of 32.0 to 32.9 in adult, unspecified obesity type Lindsay Hayden is currently in the action stage of change. As such, her goal is to continue with weight loss efforts. She has agreed to the Category 2 Plan + 100 calories and keeping a food journal and adhering to recommended goals of 400-500 calories and 35 grams of protein at supper daily.   We will recheck fasting labs at her next office visit.  Exercise goals: Increase stationary bike to 20 minutes 2 times per week (on days off), and 10 minutes 1 time per week.  Behavioral modification strategies: increasing lean protein intake, decreasing eating out and meal planning and cooking strategies.  Lindsay Hayden has agreed to follow-up with our clinic in 2 to 3 weeks.   Objective:   Blood pressure 133/72, pulse 99, temperature 98 F (36.7 C), height 5\' 3"  (1.6 m), weight 185 lb (83.9 kg), last menstrual period 07/16/2017, SpO2 96 %. Body mass index is 32.77 kg/m.  General: Cooperative, alert, well developed, in no acute distress. HEENT: Conjunctivae and lids unremarkable. Cardiovascular: Regular rhythm.  Lungs: Normal work of breathing. Neurologic: No focal deficits.   Lab Results  Component Value Date   CREATININE 0.96 12/13/2019   BUN 20 12/13/2019   NA 141 12/13/2019   K 4.6 12/13/2019   CL 106 12/13/2019   CO2 22 12/13/2019   Lab Results  Component Value Date   ALT 18 12/13/2019   AST 15 12/13/2019   ALKPHOS 96 12/13/2019  BILITOT 0.6 12/13/2019   Lab Results  Component Value Date   HGBA1C 6.2 (H) 04/10/2020   HGBA1C 6.6 (H) 12/13/2019   Lab Results  Component Value Date   INSULIN 11.0 04/10/2020   INSULIN 25.6 (H) 12/13/2019   Lab Results  Component Value Date   TSH 1.790 12/13/2019   Lab Results  Component Value Date   CHOL 149 10/07/2019   HDL 46 10/07/2019   LDLCALC 90 10/07/2019   TRIG 62 10/07/2019   CHOLHDL  3.2 10/07/2019   Lab Results  Component Value Date   WBC 5.0 12/13/2019   HGB 13.6 12/13/2019   HCT 42.4 12/13/2019   MCV 87 12/13/2019   PLT 296 12/13/2019   No results found for: IRON, TIBC, FERRITIN  Attestation Statements:   Reviewed by clinician on day of visit: allergies, medications, problem list, medical history, surgical history, family history, social history, and previous encounter notes.   Wilhemena Durie, am acting as Location manager for [employer], FNP-C.  I have reviewed the above documentation for accuracy and completeness, and I agree with the above. - Georgianne Fick, FNP

## 2020-08-09 ENCOUNTER — Encounter (INDEPENDENT_AMBULATORY_CARE_PROVIDER_SITE_OTHER): Payer: Self-pay | Admitting: Family Medicine

## 2020-08-09 DIAGNOSIS — I5040 Unspecified combined systolic (congestive) and diastolic (congestive) heart failure: Secondary | ICD-10-CM | POA: Insufficient documentation

## 2020-08-09 DIAGNOSIS — E119 Type 2 diabetes mellitus without complications: Secondary | ICD-10-CM | POA: Insufficient documentation

## 2020-08-28 ENCOUNTER — Ambulatory Visit (INDEPENDENT_AMBULATORY_CARE_PROVIDER_SITE_OTHER): Payer: POS | Admitting: Cardiology

## 2020-08-28 ENCOUNTER — Other Ambulatory Visit: Payer: Self-pay

## 2020-08-28 ENCOUNTER — Encounter: Payer: Self-pay | Admitting: Cardiology

## 2020-08-28 VITALS — BP 122/80 | HR 62 | Ht 63.0 in | Wt 189.2 lb

## 2020-08-28 DIAGNOSIS — I10 Essential (primary) hypertension: Secondary | ICD-10-CM

## 2020-08-28 DIAGNOSIS — E669 Obesity, unspecified: Secondary | ICD-10-CM

## 2020-08-28 DIAGNOSIS — I5042 Chronic combined systolic (congestive) and diastolic (congestive) heart failure: Secondary | ICD-10-CM

## 2020-08-28 DIAGNOSIS — I447 Left bundle-branch block, unspecified: Secondary | ICD-10-CM | POA: Diagnosis not present

## 2020-08-28 DIAGNOSIS — G4733 Obstructive sleep apnea (adult) (pediatric): Secondary | ICD-10-CM | POA: Diagnosis not present

## 2020-08-28 MED ORDER — ENTRESTO 97-103 MG PO TABS: 1.0000 | ORAL_TABLET | Freq: Two times a day (BID) | ORAL | 11 refills | Status: DC

## 2020-08-28 NOTE — Patient Instructions (Signed)
Medication Instructions:  Your physician has recommended you make the following change in your medication:  1) INCREASE Entresto to 97-101 mg twice daily   *If you need a refill on your cardiac medications before your next appointment, please call your pharmacy*   Lab Work: BMET in one week If you have labs (blood work) drawn today and your tests are completely normal, you will receive your results only by: Marland Kitchen MyChart Message (if you have MyChart) OR . A paper copy in the mail If you have any lab test that is abnormal or we need to change your treatment, we will call you to review the results.  Follow-Up: At [facility], you and your health needs are our priority.  As part of our continuing mission to provide you with exceptional heart care, we have created designated Provider Care Teams.  These Care Teams include your primary Cardiologist (physician) and Advanced Practice Providers (APPs -  Physician Assistants and Nurse Practitioners) who all work together to provide you with the care you need, when you need it.  Your next appointment:   6 month(s)  The format for your next appointment:   In Person  Provider:   You may see Fransico Him, MD or one of the following Advanced Practice Providers on your designated Care Team:    Melina Copa, PA-C  Ermalinda Barrios, PA-C

## 2020-08-28 NOTE — Progress Notes (Signed)
Date:  08/28/2020   ID:  Lindsay Hayden, DOB 09-Oct-1960, MRN [id number]   PCP:  Lindsay Lass, MD  Cardiologist:  Lindsay Him, MD  Electrophysiologist:  None   Chief Complaint:  NICM  History of Present Illness:    Lindsay Hayden is a 59 y.o. 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  She was previously followed by Dr. Wynonia Hayden. She was found to have low EF in 2006 and  cath around that time as well as a few years later showed no evidence of of coronary obstruction. She was also found to be hypertensive at that time around 150/90s. She had no history of heavy ETOH or illicit drug use. No family history of cardiomyopathy. She reported a significant viral illness in 2001 but this was years before diagnosis of CHF.  F/u echo 03/2019 showed EF 35-40%, moderate LVH, pseudonormalization, LBBB, and normal RVSP. She was  previously on hydralazine but did not tolerate nighttime dosing due to tinnitus. Cardiac MRI 08/09/19 and showed moderate LVE with diffuse hypokinesis, EF 43%, no infarct/scar and no evidence of amyloidosis, mild MR, mild LAE, normal RV size and function.   She is here today for followup and is doing well.  She denies any chest pain or pressure, SOB, DOE, PND, orthopnea, LE edema, palpitations or syncope. Occasionally she will notice some dizziness that sounds like vertigo with difficulty keeping her balance.  She is compliant with her meds and is tolerating meds with no SE.    She is doing well with her CPAP device and thinks that she has gotten used to it.  She tolerates the mask and feels the pressure is adequate.  Since going on CPAP she feels rested in the am and has no significant daytime sleepiness.  She has some problems with  mouth dryness and uses a nasal mask and has not been using her chin strap. She does not think that he snores.    Prior CV studies:   The following studies were reviewed today:  PAP compliance download, labs, 2D echo 2020  Past Medical History:  Diagnosis Date  . Anemia   . Arthritis   . Asthma   . Cardiomyopathy ([place])   . Chronic combined systolic and diastolic CHF (congestive heart failure) ([place])   . Diabetes mellitus without complication ([place])   . Hypercalcemia   . Hyperlipidemia   . Hypertension   . Hypothyroid   . LBBB (left bundle branch block)   . LVH (left ventricular hypertrophy)   . NICM (nonischemic cardiomyopathy) ([place])   . OSA (obstructive sleep apnea)   . Overweight   . Prediabetes   . Shortness of breath   . Sleep apnea    Past Surgical History:  Procedure Laterality Date  . ACHILLES TENDON SURGERY     right  . CESAREAN SECTION     x2  . KNEE ARTHROSCOPY WITH MENISCAL REPAIR     right knee  . OVARIAN CYST REMOVAL     right     Current Meds  Medication Sig  . acetaminophen (TYLENOL) 650 MG CR tablet Take 650 mg by mouth every 8 (eight) hours as needed for pain.  Marland Kitchen albuterol (PROVENTIL HFA;VENTOLIN HFA) 108 (90 Base) MCG/ACT inhaler Inhale into the lungs every 6 (six) hours as needed for wheezing  or shortness of breath.  Marland Kitchen atorvastatin (LIPITOR) 10 MG tablet Take 10 mg by mouth daily.  . Cholecalciferol (VITAMIN D) 125 MCG (5000 UT) CAPS Take 5,000 Units by mouth daily.   . fluticasone (FLOVENT HFA) 110 MCG/ACT inhaler 1 puff  . furosemide (LASIX) 20 MG tablet Take 1 tablet (20 mg total) by mouth daily.  Marland Kitchen LEVOXYL 88 MCG tablet Take 1 tablet by mouth daily.  . meclizine (ANTIVERT) 25 MG tablet Take 1 tablet (25 mg total) by mouth 3 (three) times daily as needed for dizziness.  . metoprolol succinate (TOPROL-XL) 25 MG 24 hr tablet Take 1 tablet (25 mg total) by mouth daily.  . naproxen sodium (ALEVE) 220 MG tablet Take 220 mg by mouth daily as needed.    . sacubitril-valsartan (ENTRESTO) 49-51 MG Take 1 tablet by mouth 2 (two) times daily.  Marland Kitchen spironolactone (ALDACTONE) 25 MG tablet Take 1 tablet (25 mg total) by mouth daily.  Marland Kitchen tiZANidine (ZANAFLEX) 4 MG tablet Take 4 mg by mouth as needed. As needed for muscle spasms     Allergies:   Patient has no known allergies.   Social History   Tobacco Use  . Smoking status: Never Smoker  . Smokeless tobacco: Never Used  Vaping Use  . Vaping Use: Never used  Substance Use Topics  . Alcohol use: Yes    Alcohol/week: 1.0 standard drink    Types: 1 Glasses of wine per week    Comment: 1-2 times a month  . Drug use: No     Family Hx: The patient's family history includes Alcohol abuse in her father; Cancer in her father; Diabetes in her mother; Hypertension in her father and mother; Obesity in her mother; Stroke in her mother.  ROS:   Please see the history of present illness.     All other systems reviewed and are negative.   Labs/Other Tests and Data Reviewed:    Recent Labs: 12/13/2019: ALT 18; BUN 20; Creatinine, Ser 0.96; Hemoglobin 13.6; Platelets 296; Potassium 4.6; Sodium 141; TSH 1.790   Recent Lipid Panel Lab Results  Component Value Date/Time   CHOL 149 10/07/2019 08:47 AM   TRIG 62 10/07/2019 08:47 AM   HDL 46 10/07/2019 08:47 AM   CHOLHDL 3.2 10/07/2019 08:47 AM   LDLCALC 90 10/07/2019 08:47 AM    Wt Readings from Last 3 Encounters:  08/28/20 189 lb 3.2 oz (85.8 kg)  08/07/20 185 lb (83.9 kg)  07/25/20 186 lb (84.4 kg)     Objective:    Vital Signs:  BP 122/80   Pulse 62   Ht 5\' 3"  (1.6 m)   Wt 189 lb 3.2 oz (85.8 kg)   LMP 07/16/2017   SpO2 95%   BMI 33.52 kg/m    GEN: Well nourished, well developed in no acute distress HEENT: Normal NECK: No JVD; No carotid bruits LYMPHATICS: No lymphadenopathy CARDIAC:RRR, no murmurs, rubs, gallops RESPIRATORY:  Clear to auscultation without rales, wheezing or rhonchi  ABDOMEN: Soft, non-tender,  non-distended MUSCULOSKELETAL:  No edema; No deformity  SKIN: Warm and dry NEUROLOGIC:  Alert and oriented x 3 PSYCHIATRIC:  Normal affect    ASSESSMENT & PLAN:    1.  Chronic combined CHF/NICM -considered nonischemic as 2 caths have shown normal coronary arteries -Cardiac MRI 07/2019 showed no evidence of infiltrative disease including amyloid>>EF 43% -weight fairly stable with no DOE or LE edema -we have been uptitrating guideline-directed HF medical therapy -continue Toprol XL 25mg  daily, Lasix 20mg  daily  and spiro 25mg  daily -BP will tolerate so will increase Entresto to 97-101mg  BID -check BMET in 1 week and PharmD in 1 week to make sure she is tolerating the higher dose of Entresto -repeat echo in 3 months  2.  Essential HTN  -BP controlled on exam today -continue Toprol, Lindsay Hayden  3.  Chronic LBBB -normal coronary arteries on cath  4. OSA -  The patient is tolerating PAP therapy well without any problems. The PAP download was reviewed today and showed an AHI of 0.5/hr on auto PAP with 83% compliance in using more than 4 hours nightly.  The patient has been using and benefiting from PAP use and will continue to benefit from therapy.  -she has some problems with mouth dryness and has not been using her chin strap -she prefers to keep doing what she is doing at present.  5.  Obesity -she has been going to healthy weight and wellness clinic -she is still seeing the wellness MD  Followup with me in 6 months  Medication Adjustments/Labs and Tests Ordered: Current medicines are reviewed at length with the patient today.  Concerns regarding medicines are outlined above.  Tests Ordered: No orders of the defined types were placed in this encounter.  Medication Changes: No orders of the defined types were placed in this encounter.   Disposition:  Follow up in 6 month(s)  Signed, Lindsay Him, MD  08/28/2020 11:17 AM    [place]

## 2020-08-28 NOTE — Addendum Note (Signed)
Addended by: Antonieta Iba on: 08/28/2020 11:31 AM   Modules accepted: Orders

## 2020-08-29 ENCOUNTER — Ambulatory Visit (INDEPENDENT_AMBULATORY_CARE_PROVIDER_SITE_OTHER): Payer: POS | Admitting: Family Medicine

## 2020-08-29 ENCOUNTER — Encounter (INDEPENDENT_AMBULATORY_CARE_PROVIDER_SITE_OTHER): Payer: Self-pay | Admitting: Family Medicine

## 2020-08-29 VITALS — BP 150/82 | HR 71 | Temp 97.9°F | Ht 63.0 in | Wt 185.0 lb

## 2020-08-29 DIAGNOSIS — Z9189 Other specified personal risk factors, not elsewhere classified: Secondary | ICD-10-CM | POA: Diagnosis not present

## 2020-08-29 DIAGNOSIS — E559 Vitamin D deficiency, unspecified: Secondary | ICD-10-CM | POA: Diagnosis not present

## 2020-08-29 DIAGNOSIS — E669 Obesity, unspecified: Secondary | ICD-10-CM

## 2020-08-29 DIAGNOSIS — E1169 Type 2 diabetes mellitus with other specified complication: Secondary | ICD-10-CM | POA: Diagnosis not present

## 2020-08-29 DIAGNOSIS — Z6832 Body mass index (BMI) 32.0-32.9, adult: Secondary | ICD-10-CM

## 2020-08-29 DIAGNOSIS — E785 Hyperlipidemia, unspecified: Secondary | ICD-10-CM

## 2020-08-29 NOTE — Progress Notes (Signed)
Chief Complaint:   OBESITY Lindsay Hayden is here to discuss her progress with her obesity treatment plan along with follow-up of her obesity related diagnoses. Lindsay Hayden is on the Category 2 Plan + 100 calories and keeping a food journal and adhering to recommended goals of 400-500 calories and 35 grams of protein at supper daily and states she is following her eating plan approximately 90% of the time. Lindsay Hayden states she is bike riding for 10-20 minutes 3 times per week.  Today's visit was #: 30 Starting weight: 198 lbs Starting date: 12/13/2019 Today's weight: 185 lbs Today's date: 08/29/2020 Total lbs lost to date: 13 Total lbs lost since last in-office visit: 0  Interim History: Lindsay Hayden notes she has had more sweets over the last few weeks, she is eating all of the food on the plan. She feels her hunger is satisfied for the most part. 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 She has to work on Christmas. She works for [employer].  Subjective:   1. Hyperlipidemia associated with type 2 diabetes mellitus ([place]) Lindsay Hayden's last LDL was at goal at 90, HDL was low at 46, and triglyerides were at goal at 62. She is on Lipitor 10 mg.  Lab Results  Component Value Date   ALT 18 12/13/2019   AST 15 12/13/2019   ALKPHOS 96 12/13/2019   BILITOT 0.6 12/13/2019   Lab Results  Component Value Date   CHOL 149 10/07/2019   HDL 46 10/07/2019   LDLCALC 90 10/07/2019   TRIG 62 10/07/2019   CHOLHDL 3.2 10/07/2019   2. Type 2 diabetes mellitus with other specified complication, without long-term current use of insulin (HCC) Lindsay Hayden's last A1c was 6.2, and she is not on medications. Her diabetes mellitus is well controlled. She would like to avoid taking medication if possible.   Lab Results  Component Value Date   HGBA1C 6.2 (H) 04/10/2020   HGBA1C 6.6 (H) 12/13/2019   Lab Results  Component Value Date   LDLCALC 90  10/07/2019   CREATININE 0.96 12/13/2019   Lab Results  Component Value Date   INSULIN 11.0 04/10/2020   INSULIN 25.6 (H) 12/13/2019   3. Vitamin D deficiency Lindsay Hayden is on OTC Vit D daily 5,000 IU. Last Vit D level was 65.1 on 12/13/2019.  4. At risk for activity intolerance Lindsay Hayden is at risk for exercise intolerance due to congestive heart failure.  Assessment/Plan:   1. Hyperlipidemia associated with type 2 diabetes mellitus ([place]) . We will check labs today. Navpreet will continue to work on diet, exercise and weight loss efforts.  - Lipid Panel With LDL/HDL Ratio  2. Type 2 diabetes mellitus with other specified complication, without long-term current use of insulin ([place])  We will check labs today, and Deniesha will continue to follow up as directed.  - Hemoglobin A1c - Insulin, random - Comprehensive metabolic panel  3. Vitamin D deficiency  We will check labs today. Lindsay Hayden agreed to continue taking OTC Vitamin D 5,000 IU every week and will follow-up for routine testing of Vitamin D, at least 2-3 times per year to avoid over-replacement.  - VITAMIN D 25 Hydroxy (Vit-D Deficiency, Fractures)  4. At risk for activity intolerance Lindsay Hayden was given approximately 15 minutes of exercise intolerance counseling today. She is 59 y.o. female and has risk factors exercise intolerance including obesity and CHF. We discussed intensive lifestyle modifications today with an emphasis on specific  weight loss instructions and strategies. Lindsay Hayden will slowly increase activity as tolerated.  Repetitive spaced learning was employed today to elicit superior memory formation and behavioral change.  5. Class 1 obesity with serious comorbidity and body mass index (BMI) of 32.0 to 32.9 in adult, unspecified obesity type Lindsay Hayden is currently in the action stage of change. As such, her goal is to continue with weight loss efforts. She has agreed to the Category 2 Plan + 100  calories.   Exercise goals: Slowly work on slowly increasing duration of exercise.  Behavioral modification strategies: decreasing simple carbohydrates.  Lindsay Hayden has agreed to follow-up with our clinic in 3 weeks. Lindsay Hayden was informed we would discuss her lab results at her next visit unless there is a critical issue that needs to be addressed sooner. Lindsay Hayden agreed to keep her next visit at the agreed upon time to discuss these results.  Objective:   Blood pressure (!) 150/82, pulse 71, temperature 97.9 F (36.6 C), height 5\' 3"  (1.6 m), weight 185 lb (83.9 kg), last menstrual period 07/16/2017, SpO2 97 %. Body mass index is 32.77 kg/m.  General: Cooperative, alert, well developed, in no acute distress. HEENT: Conjunctivae and lids unremarkable. Cardiovascular: Regular rhythm.  Lungs: Normal work of breathing. Neurologic: No focal deficits.   Lab Results  Component Value Date   CREATININE 0.96 12/13/2019   BUN 20 12/13/2019   NA 141 12/13/2019   K 4.6 12/13/2019   CL 106 12/13/2019   CO2 22 12/13/2019   Lab Results  Component Value Date   ALT 18 12/13/2019   AST 15 12/13/2019   ALKPHOS 96 12/13/2019   BILITOT 0.6 12/13/2019   Lab Results  Component Value Date   HGBA1C 6.2 (H) 04/10/2020   HGBA1C 6.6 (H) 12/13/2019   Lab Results  Component Value Date   INSULIN 11.0 04/10/2020   INSULIN 25.6 (H) 12/13/2019   Lab Results  Component Value Date   TSH 1.790 12/13/2019   Lab Results  Component Value Date   CHOL 149 10/07/2019   HDL 46 10/07/2019   LDLCALC 90 10/07/2019   TRIG 62 10/07/2019   CHOLHDL 3.2 10/07/2019   Lab Results  Component Value Date   WBC 5.0 12/13/2019   HGB 13.6 12/13/2019   HCT 42.4 12/13/2019   MCV 87 12/13/2019   PLT 296 12/13/2019   No results found for: IRON, TIBC, FERRITIN  Attestation Statements:   Reviewed by clinician on day of visit: allergies, medications, problem list, medical history, surgical history, family  history, social history, and previous encounter notes.   Wilhemena Durie, am acting as Location manager for [employer], FNP-C.  I have reviewed the above documentation for accuracy and completeness, and I agree with the above. -  Georgianne Fick, FNP

## 2020-08-30 LAB — COMPREHENSIVE METABOLIC PANEL
ALT: 19 IU/L (ref 0–32)
AST: 15 IU/L (ref 0–40)
Albumin/Globulin Ratio: 1.4 (ref 1.2–2.2)
Albumin: 4.4 g/dL (ref 3.8–4.9)
Alkaline Phosphatase: 99 IU/L (ref 44–121)
BUN/Creatinine Ratio: 20 (ref 9–23)
BUN: 19 mg/dL (ref 6–24)
Bilirubin Total: 0.7 mg/dL (ref 0.0–1.2)
CO2: 23 mmol/L (ref 20–29)
Calcium: 10.2 mg/dL (ref 8.7–10.2)
Chloride: 103 mmol/L (ref 96–106)
Creatinine, Ser: 0.94 mg/dL (ref 0.57–1.00)
GFR calc Af Amer: 78 mL/min/{1.73_m2} (ref >59)
GFR calc non Af Amer: 68 mL/min/{1.73_m2} (ref >59)
Globulin, Total: 3.2 g/dL (ref 1.5–4.5)
Glucose: 113 mg/dL — ABNORMAL HIGH (ref 65–99)
Potassium: 4.5 mmol/L (ref 3.5–5.2)
Sodium: 138 mmol/L (ref 134–144)
Total Protein: 7.6 g/dL (ref 6.0–8.5)

## 2020-08-30 LAB — LIPID PANEL WITH LDL/HDL RATIO
Cholesterol, Total: 167 mg/dL (ref 100–199)
HDL: 45 mg/dL (ref >39)
LDL Chol Calc (NIH): 110 mg/dL — ABNORMAL HIGH (ref 0–99)
LDL/HDL Ratio: 2.4 ratio (ref 0.0–3.2)
Triglycerides: 62 mg/dL (ref 0–149)
VLDL Cholesterol Cal: 12 mg/dL (ref 5–40)

## 2020-08-30 LAB — HEMOGLOBIN A1C
Est. average glucose Bld gHb Est-mCnc: 134 mg/dL
Hgb A1c MFr Bld: 6.3 % — ABNORMAL HIGH (ref 4.8–5.6)

## 2020-08-30 LAB — VITAMIN D 25 HYDROXY (VIT D DEFICIENCY, FRACTURES): Vit D, 25-Hydroxy: 80.5 ng/mL (ref 30.0–100.0)

## 2020-08-30 LAB — INSULIN, RANDOM: INSULIN: 13 u[IU]/mL (ref 2.6–24.9)

## 2020-09-11 ENCOUNTER — Other Ambulatory Visit: Payer: Self-pay

## 2020-09-11 ENCOUNTER — Ambulatory Visit (INDEPENDENT_AMBULATORY_CARE_PROVIDER_SITE_OTHER): Payer: POS | Admitting: Pharmacist

## 2020-09-11 ENCOUNTER — Other Ambulatory Visit: Payer: POS | Admitting: *Deleted

## 2020-09-11 VITALS — BP 132/78 | HR 73 | Wt 191.8 lb

## 2020-09-11 DIAGNOSIS — E7800 Pure hypercholesterolemia, unspecified: Secondary | ICD-10-CM | POA: Insufficient documentation

## 2020-09-11 DIAGNOSIS — I5042 Chronic combined systolic (congestive) and diastolic (congestive) heart failure: Secondary | ICD-10-CM | POA: Diagnosis not present

## 2020-09-11 DIAGNOSIS — M179 Osteoarthritis of knee, unspecified: Secondary | ICD-10-CM | POA: Insufficient documentation

## 2020-09-11 DIAGNOSIS — R739 Hyperglycemia, unspecified: Secondary | ICD-10-CM | POA: Insufficient documentation

## 2020-09-11 DIAGNOSIS — J4530 Mild persistent asthma, uncomplicated: Secondary | ICD-10-CM | POA: Insufficient documentation

## 2020-09-11 DIAGNOSIS — M5430 Sciatica, unspecified side: Secondary | ICD-10-CM | POA: Insufficient documentation

## 2020-09-11 DIAGNOSIS — I10 Essential (primary) hypertension: Secondary | ICD-10-CM

## 2020-09-11 DIAGNOSIS — G4733 Obstructive sleep apnea (adult) (pediatric): Secondary | ICD-10-CM

## 2020-09-11 DIAGNOSIS — E039 Hypothyroidism, unspecified: Secondary | ICD-10-CM | POA: Insufficient documentation

## 2020-09-11 DIAGNOSIS — I447 Left bundle-branch block, unspecified: Secondary | ICD-10-CM

## 2020-09-11 DIAGNOSIS — E669 Obesity, unspecified: Secondary | ICD-10-CM

## 2020-09-11 LAB — BASIC METABOLIC PANEL
BUN/Creatinine Ratio: 21 (ref 9–23)
BUN: 18 mg/dL (ref 6–24)
CO2: 24 mmol/L (ref 20–29)
Calcium: 10.7 mg/dL — ABNORMAL HIGH (ref 8.7–10.2)
Chloride: 104 mmol/L (ref 96–106)
Creatinine, Ser: 0.84 mg/dL (ref 0.57–1.00)
GFR calc Af Amer: 89 mL/min/{1.73_m2} (ref >59)
GFR calc non Af Amer: 77 mL/min/{1.73_m2} (ref >59)
Glucose: 119 mg/dL — ABNORMAL HIGH (ref 65–99)
Potassium: 4.5 mmol/L (ref 3.5–5.2)
Sodium: 141 mmol/L (ref 134–144)

## 2020-09-11 NOTE — Progress Notes (Signed)
Patient ID: Lindsay Hayden                 DOB: May 03, 1961                      MRN: [id number]     HPI: Lindsay Hayden is a 59 y.o. female referred by Dr. Mayford Hayden for CHF management. PMH is significant for CHF, HTN chronic LBBB, OSA, DM, hyporthyroidism, and obesity.  Last EF 43%.  At last visit with Dr Lindsay Hayden on 12/13, Lindsay Hayden was increased to 97-103 and lab work was scheduled.  Patient presents today for first visit for CHF management.  Reports that when she started the new dose of Entresto she felt lightheaded but that only last once day.  Remains on metoprolol succinate 25mg  daily, spironolactone 25mg  daily, furosemide 20 mg daily,and Entresto 97-103.  Denies headache, chest pain, blurry vision and shortness of breath.  IS enrolled in a weight management program through [facility] so diet is controlled.  Adds very little salt or sugar to foods. Believes it has been effective however weight has been variable.  Does not believe she has any edema and is weighing her self every morning.  Is not checking blood pressure or blood sugar at home.  Is hesitant to start any new medications or increase ones she is currently on.    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  Current HTN meds: metoprolol succinate 25mg  daily, Entresto 97/103 BID, furosemide 20mg  daily, spironolactone 25mg  daily daily Previously tried: enalapril 20 mg, hydralazine 25mg , Entresto 24-26, 49-51 BP goal: <130/80  Family History: Mother had stroke in 69s  Social History: Does not smoke or use tobacco.  Drinks socially, less than once a week   Diet: 3 meals a day following weight management program.  Eggs, , low cal bread, 4 oz meat, low cal bread apples, mustard, 6-8 oz meat, double veggies, sometimes rice, no saltwater tea  Home BP readings:  Not checking  Wt Readings from Last 3 Encounters:  08/29/20 185 lb (83.9 kg)  08/28/20 189 lb 3.2 oz (85.8 kg)  08/07/20 185 lb (83.9 kg)   BP Readings from  Last 3 Encounters:  08/29/20 (!) 150/82  08/28/20 122/80  08/07/20 133/72   Pulse Readings from Last 3 Encounters:  08/29/20 71  08/28/20 62  08/07/20 99    Renal function: Estimated Creatinine Clearance: 67.8 mL/min (by C-G formula based on SCr of 0.94 mg/dL).  Past Medical History:  Diagnosis Date   Anemia    Arthritis    Asthma    Cardiomyopathy (HCC)    Chronic combined systolic and diastolic CHF (congestive heart failure) (HCC)    Diabetes mellitus without complication (HCC)    Hypercalcemia    Hyperlipidemia    Hypertension    Hypothyroid    LBBB (left bundle branch block)    LVH (left ventricular hypertrophy)    NICM (nonischemic cardiomyopathy) (HCC)    OSA (obstructive sleep apnea)    Overweight    Prediabetes    Shortness of breath    Sleep apnea     Current Outpatient Medications on File Prior to Visit  Medication Sig Dispense Refill   acetaminophen (TYLENOL) 650 MG CR tablet Take 650 mg by mouth every 8 (eight) hours as needed for pain.     albuterol (PROVENTIL HFA;VENTOLIN HFA) 108 (90 Base) MCG/ACT inhaler Inhale into the lungs every 6 (six) hours as needed for wheezing or shortness of breath.  atorvastatin (LIPITOR) 10 MG tablet Take 10 mg by mouth daily.     Cholecalciferol (VITAMIN D) 125 MCG (5000 UT) CAPS Take 5,000 Units by mouth daily.      fluticasone (FLOVENT HFA) 110 MCG/ACT inhaler 1 puff     furosemide (LASIX) 20 MG tablet Take 1 tablet (20 mg total) by mouth daily. 90 tablet 3   LEVOXYL 88 MCG tablet Take 1 tablet by mouth daily.     meclizine (ANTIVERT) 25 MG tablet Take 1 tablet (25 mg total) by mouth 3 (three) times daily as needed for dizziness. 30 tablet 0   metoprolol succinate (TOPROL-XL) 25 MG 24 hr tablet Take 1 tablet (25 mg total) by mouth daily. 90 tablet 2   naproxen sodium (ALEVE) 220 MG tablet Take 220 mg by mouth daily as needed.      sacubitril-valsartan (ENTRESTO) 97-103 MG Take 1 tablet by  mouth 2 (two) times daily. 60 tablet 11   spironolactone (ALDACTONE) 25 MG tablet Take 1 tablet (25 mg total) by mouth daily. 90 tablet 2   tiZANidine (ZANAFLEX) 4 MG tablet Take 4 mg by mouth as needed. As needed for muscle spasms     No current facility-administered medications on file prior to visit.    No Known Allergies   Assessment/Plan:  1. CHF - Patient BP in room today 132/78 which is slightly above goal of <130/80.  BP has been at goal at previous office visits.  Very hesitant to start or increase medications at this time.  IS due for BMP since increasing Entresto.  Advised to continue heart healthy diet as instructed by weight management clinic and continue to minimize salt content.  Recommended patient call office if she gains 3# overnight or 5# in a week.  Continue medication compliance.  Recommended patient begin checking BP at home and logging results.  Will follow up with patient in January and providing lab work stable, consider increase in metoprolol or addition of Farxiga/Jardiance.  Patient voiced understanding.  Continue Entresto 97/103 BID Continue spironolactone 25 mg daily Continue furosemide 20 mg daily Continue metoprolol succinate 25mg  daily Recheck in clinic in 2-4 weeks  , PharmD, BCACP, CDCES, CPP [facility] [address] [place], [place] Phone: [phone]; Fax: [phone] 09/11/2020 10:30 AM

## 2020-09-11 NOTE — Patient Instructions (Addendum)
It was nice meeting you today!  We would like your blood pressure to be less than 130/80  Continue your spironolactone 25 mg daily, Entresto 97/103 twice a day, furosemide 20 mg daily, and your metoprolol 25 mg once a day  Start taking your blood pressure at home and write down your results  Continue to weigh yourself at home and let us know if you gain more than 3 pounds overnight or 5 pounds in a week  We will call tomorrow with your lab results  Laural Golden, PharmD, BCACP, CDCES, CPP [facility] [address], [place], [place] [address] Phone: [phone]; Fax: [phone] 09/11/2020 10:03 AM

## 2020-09-25 ENCOUNTER — Encounter (INDEPENDENT_AMBULATORY_CARE_PROVIDER_SITE_OTHER): Payer: Self-pay | Admitting: Family Medicine

## 2020-09-25 ENCOUNTER — Other Ambulatory Visit: Payer: Self-pay

## 2020-09-25 ENCOUNTER — Ambulatory Visit (INDEPENDENT_AMBULATORY_CARE_PROVIDER_SITE_OTHER): Payer: POS | Admitting: Family Medicine

## 2020-09-25 VITALS — BP 127/83 | HR 68 | Temp 98.9°F | Ht 63.0 in | Wt 189.0 lb

## 2020-09-25 DIAGNOSIS — Z6833 Body mass index (BMI) 33.0-33.9, adult: Secondary | ICD-10-CM

## 2020-09-25 DIAGNOSIS — Z9189 Other specified personal risk factors, not elsewhere classified: Secondary | ICD-10-CM | POA: Diagnosis not present

## 2020-09-25 DIAGNOSIS — E559 Vitamin D deficiency, unspecified: Secondary | ICD-10-CM

## 2020-09-25 DIAGNOSIS — E7849 Other hyperlipidemia: Secondary | ICD-10-CM | POA: Diagnosis not present

## 2020-09-25 DIAGNOSIS — E669 Obesity, unspecified: Secondary | ICD-10-CM

## 2020-09-25 MED ORDER — ATORVASTATIN CALCIUM 20 MG PO TABS: 20.0000 mg | ORAL_TABLET | Freq: Every day | ORAL | 0 refills | Status: AC

## 2020-09-25 MED ORDER — VITAMIN D 125 MCG (5000 UT) PO CAPS: ORAL_CAPSULE | ORAL | 0 refills | Status: AC

## 2020-09-25 NOTE — Progress Notes (Signed)
The 10-year ASCVD risk score Mikey Bussing DC Brooke Bonito., et al., 2013) is: 12.6%   Values used to calculate the score:     Age: 60 years     Sex: Female     Is Non-Hispanic African American: Yes     Diabetic: Yes     Tobacco smoker: No     Systolic Blood Pressure: 169 mmHg     Is BP treated: Yes     HDL Cholesterol: 45 mg/dL     Total Cholesterol: 167 mg/dL

## 2020-09-27 NOTE — Progress Notes (Signed)
Chief Complaint:   OBESITY Lindsay Hayden is here to discuss her progress with her obesity treatment plan along with follow-up of her obesity related diagnoses. Lindsay Hayden is on the Category 2 Plan and 100 calories and states she is following her eating plan approximately 90% of the time. Lindsay Hayden states she is riding the bike 20 minutes 2 times per week.  Today's visit was #: 84 Starting weight: 198 lbs Starting date: 12/13/2019 Today's weight: 189 lbs Today's date: 09/25/2020 Total lbs lost to date: 9 lbs Total lbs lost since last in-office visit: (+4)  Interim History: Lindsay Hayden is up 4 lbs today. She feels the recently increased dose of Entresto has increased her weight. Lindsay Hayden feels this is water weight. She was able to stick to plan very well over the holidays. She is getting protein in and extra calories are <300 per day. Drinks 5 calorie Minute Maid with 0 sugar 1 time a day.  Subjective:   1. Other hyperlipidemia  Lindsay Hayden is tolerating atorvastatin well. LDL is worsening and not at goal. Has increased to 110 from 90(11 months ago). 10 year ASCVD risk score is 12.8%.  Lab Results  Component Value Date   ALT 19 08/29/2020   AST 15 08/29/2020   ALKPHOS 99 08/29/2020   BILITOT 0.7 08/29/2020   Lab Results  Component Value Date   CHOL 167 08/29/2020   HDL 45 08/29/2020   LDLCALC 110 (H) 08/29/2020   TRIG 62 08/29/2020   CHOLHDL 3.2 10/07/2019    2. Vitamin D deficiency Lindsay Hayden's Vitamin D level has increased from 65.1 to 80.5. She is on prescription Vitamin D. She denies nausea, vomiting or muscle weakness.  3. At risk for side effect of medication Lindsay Hayden is at risk of side effects from over replacement Vitamin D.   Assessment/Plan:   1. Other hyperlipidemia Continue: - atorvastatin (LIPITOR) 20 MG tablet; Take 1 tablet (20 mg total) by mouth daily.  Dispense: 90 tablet; Refill: 0  2. Vitamin D deficiency . She agrees to continue to take  prescription Vitamin D @50 ,000 IU every week and will follow-up for routine testing of Vitamin D, at least 2-3 times per year to avoid over-replacement. Over the counter Vitamin D is decreased to 5 days per week.  - Cholecalciferol (VITAMIN D) 125 MCG (5000 UT) CAPS; Take by mouth 5 days per week.  Dispense: 30 capsule; Refill: 0  3. At risk for side effect of medication Lindsay Hayden was given approximately 15 minutes of drug side effect counseling today.  We discussed side effect possibility and risk versus benefits. Lindsay Hayden agreed to the medication and will contact this office if these side effects are intolerable.  Repetitive spaced learning was employed today to elicit superior memory formation and behavioral change.  4. Class 1 obesity with serious comorbidity and body mass index (BMI) of 33.0 to 33.9 in adult, unspecified obesity type  Lindsay Hayden is currently in the action stage of change. As such, her goal is to continue with weight loss efforts. She has agreed to the Category 2 Plan and 100 calories.  Exercise goals: Lindsay Hayden will increase bike riding to 3 times a week   Behavioral modification strategies: planning for success.  Lindsay Hayden has agreed to follow-up with our clinic in 3 weeks. She was informed of the importance of frequent follow-up visits to maximize her success with intensive lifestyle modifications for her multiple health conditions.    Objective:   Blood pressure 127/83, pulse 68, temperature 98.9 F (37.2 C),  height 5\' 3"  (1.6 m), weight 189 lb (85.7 kg), last menstrual period 07/16/2017, SpO2 96 %. Body mass index is 33.48 kg/m.  General: Cooperative, alert, well developed, in no acute distress. HEENT: Conjunctivae and lids unremarkable. Cardiovascular: Regular rhythm.  Lungs: Normal work of breathing. Neurologic: No focal deficits.   Lab Results  Component Value Date   CREATININE 0.84 09/11/2020   BUN 18 09/11/2020   NA 141 09/11/2020   K 4.5  09/11/2020   CL 104 09/11/2020   CO2 24 09/11/2020   Lab Results  Component Value Date   ALT 19 08/29/2020   AST 15 08/29/2020   ALKPHOS 99 08/29/2020   BILITOT 0.7 08/29/2020   Lab Results  Component Value Date   HGBA1C 6.3 (H) 08/29/2020   HGBA1C 6.2 (H) 04/10/2020   HGBA1C 6.6 (H) 12/13/2019   Lab Results  Component Value Date   INSULIN 13.0 08/29/2020   INSULIN 11.0 04/10/2020   INSULIN 25.6 (H) 12/13/2019   Lab Results  Component Value Date   TSH 1.790 12/13/2019   Lab Results  Component Value Date   CHOL 167 08/29/2020   HDL 45 08/29/2020   LDLCALC 110 (H) 08/29/2020   TRIG 62 08/29/2020   CHOLHDL 3.2 10/07/2019   Lab Results  Component Value Date   WBC 5.0 12/13/2019   HGB 13.6 12/13/2019   HCT 42.4 12/13/2019   MCV 87 12/13/2019   PLT 296 12/13/2019   No results found for: IRON, TIBC, FERRITIN   Attestation Statements:   Reviewed by clinician on day of visit: allergies, medications, problem list, medical history, surgical history, family history, social history, and previous encounter notes.   IPara March, am acting as Location manager for [employer], [place].  I have reviewed the above documentation for accuracy and completeness, and I agree with the above. -  Georgianne Fick, FNP

## 2020-10-01 ENCOUNTER — Encounter (INDEPENDENT_AMBULATORY_CARE_PROVIDER_SITE_OTHER): Payer: Self-pay | Admitting: Family Medicine

## 2020-10-08 NOTE — Progress Notes (Signed)
Patient ID: Lindsay Hayden                 DOB: [birth year]                      MRN: [id number]     HPI: Lindsay Hayden is a 60 y.o. female referred by Dr. Radford Pax for CHF management. PMH is significant for CHF, HTN chronic LBBB, OSA, DM, hyporthyroidism, and obesity.  Last EF 43%.  At last visit with Dr Radford Pax on 12/13, Lindsay Hayden was increased to 97-103 and lab work was scheduled.  Pt was last seen in pharmacy clinic on 09/11/20 for initial CHF management visit. During visit, she denied headache, chest pain, blurry vision and SOB. Pt BP slightly elevated in clinic 132/78. Pt hesitant to start or increase medications. She was advised to continue her heart healthy diet as instructed by weight management clinic and to continue minimizing salt content. She was continued on Entresto 97/103 BID, spironolactone 25 mg daily, metoprolol succinate 25 mg daily and furosemide 20 mg daily.   Today she presents for follow-up for HF management. She reports that she decreased Entresto 97-103 mg BID to taking once daily secondary to insomnia. She feels this has improved since taking Entresto once a day. She continues metoprolol succinate 25 mg daily, furosemide 20 mg daily, and spironolactone 25 mg daily. She reports that she has had more shortness of breath and that her BP has increased. She endorses 1 episode of dizziness while standing that resolved within 1 minute. She denies any chest pain or headaches. She also reports an increase in weight, about 3 lbs inc since last visit. She also reports that her atorvastatin was increased to 20 mg daily. She also notes that her Psychologist, clinical coupon will expire in April.   Current HTN meds: metoprolol succinate 25mg  daily, Entresto 97/103 BID (patient only taking once daily), furosemide 20mg  daily, spironolactone 25mg  daily  Previously tried: enalapril 20 mg, hydralazine 25mg , Entresto 24-26, 49-51  BP goal: <130/80 mmHg  Family History: Mother had stroke  in 21s  Social History: Does not smoke or use tobacco.  Drinks socially, less than once a week   Diet: 3 meals a day following weight management program.  Eggs, [place], low cal bread, 4 oz meat, low cal bread apples, mustard, 6-8 oz meat, double veggies, sometimes rice, no saltwater tea  Home BP readings:  09/12/2020 - 10/09/2020 144/91, 121/80 141/82, 126/59 141/82, 132/58 139/74, 133/78 125/79, 133/62 113/70, 116/69 121/60, 134/80 126/78, 130/63 158/93, 135/53 157/93, 125/49 150/82 171/83, 136/70 131/81, 141/79 141/73, 140/72 148/94, 123/59 150/88, 130/77 155/87 128/58, 130/61 145/83 140/67, 158/98 146/76, 134/85 142/96, 137/54 137/87, 145/75 143/81, 141/87 138/74, 124/70 154/71, 139/78 147/81 132/73  Labs: 09/11/2020: Na 141, K 4.5, Scr 0.84 (after Entresto increase) 08/29/2020: Na 138, K 4.5, Scr 0.94  08/29/2020: TC 167, TG 63, HDL 45, LDL 110  10/07/19: TC 149, TG 62, HDL 46, LDL 90 Wt Readings from Last 3 Encounters:  09/25/20 189 lb (85.7 kg)  09/11/20 191 lb 12.8 oz (87 kg)  08/29/20 185 lb (83.9 kg)   BP Readings from Last 3 Encounters:  09/25/20 127/83  09/11/20 132/78  08/29/20 (!) 150/82   Pulse Readings from Last 3 Encounters:  09/25/20 68  09/11/20 73  08/29/20 71    Renal function: CrCl cannot be calculated (Patient's most recent lab result is older than the maximum 21 days allowed.).  Past Medical History:  Diagnosis Date  . Anemia   .  Arthritis   . Asthma   . Cardiomyopathy ([place])   . Chronic combined systolic and diastolic CHF (congestive heart failure) ([place])   . Diabetes mellitus without complication ([place])   . Hypercalcemia   . Hyperlipidemia   . Hypertension   . Hypothyroid   . LBBB (left bundle branch block)   . LVH (left ventricular hypertrophy)   . NICM (nonischemic cardiomyopathy) ([place])   . OSA (obstructive sleep apnea)   . Overweight   . Prediabetes   . Shortness of breath   . Sleep apnea     Current Outpatient  Medications on File Prior to Visit  Medication Sig Dispense Refill  . acetaminophen (TYLENOL) 650 MG CR tablet Take 650 mg by mouth every 8 (eight) hours as needed for pain.    Marland Kitchen albuterol (PROVENTIL HFA;VENTOLIN HFA) 108 (90 Base) MCG/ACT inhaler Inhale into the lungs every 6 (six) hours as needed for wheezing or shortness of breath.    Marland Kitchen atorvastatin (LIPITOR) 20 MG tablet Take 1 tablet (20 mg total) by mouth daily. 90 tablet 0  . Cholecalciferol (VITAMIN D) 125 MCG (5000 UT) CAPS Take by mouth 5 days per week. 30 capsule 0  . fluticasone (FLOVENT HFA) 110 MCG/ACT inhaler 1 puff    . furosemide (LASIX) 20 MG tablet Take 1 tablet (20 mg total) by mouth daily. 90 tablet 3  . LEVOXYL 88 MCG tablet Take 1 tablet by mouth daily.    . meclizine (ANTIVERT) 25 MG tablet Take 1 tablet (25 mg total) by mouth 3 (three) times daily as needed for dizziness. 30 tablet 0  . metoprolol succinate (TOPROL-XL) 25 MG 24 hr tablet Take 1 tablet (25 mg total) by mouth daily. 90 tablet 2  . naproxen sodium (ALEVE) 220 MG tablet Take 220 mg by mouth daily as needed.     . sacubitril-valsartan (ENTRESTO) 97-103 MG Take 1 tablet by mouth 2 (two) times daily. 60 tablet 11  . spironolactone (ALDACTONE) 25 MG tablet Take 1 tablet (25 mg total) by mouth daily. 90 tablet 2  . tiZANidine (ZANAFLEX) 4 MG tablet Take 4 mg by mouth as needed. As needed for muscle spasms     No current facility-administered medications on file prior to visit.    No Known Allergies   Assessment/Plan:  1. CHF - BP 148/92 mmHg elevated and above goal < 130/80 mmHg in clinic today. Home BP readings ranging from 113-171/59-96 also elevated and above goal. Pt having intolerance with higher dose Entresto 97-103 mg BID. Likely due to self decreasing Entresto dose to once daily.    Will decrease to Entresto 49-51 mg BID secondary to side effects since patient was able to tolerate whch will also help BP increase and weight gain if secondary to fluid  retention. Will start Farxiga (dapagliflozin) 10 mg daily. Will continue furosemide 20 mg daily, metoprolol succinate 25 mg daily, and spironolactone 25 mg daily. Patient provided with Wilder Glade manufacturer coupon to present to the pharmacy for reduced copay. Will follow-up with patient in clinic in 2 weeks to assess HF symptoms, BP check and Entresto/Farxiga tolerability. Pt encouraged to continue checking daily BP and weights. Pt counseled to call office if she gains 3 lbs overnight or 5 lbs in a week.  At next visit, plan to provide patient with new Entresto manufacturer coupon as patient's current one will expire in April.    Juluis Pitch, PharmD Candidate  Continue spironolactone 25mg  daily Continue furosemide 20mg  daily Continue metroprolol 25mg  daily Stop  Entresto 97-103 BID Restart Entresto 49-51 mg BID Start Farxiga 10mg  once daily Recheck in 2 weeks  Karren Cobble, PharmD, BCACP, [place], [place] [id number] [place], [place] [address] Phone: [phone]; Fax: [phone] 10/09/2020 12:39 PM

## 2020-10-09 ENCOUNTER — Encounter: Payer: Self-pay | Admitting: Pharmacist

## 2020-10-09 ENCOUNTER — Other Ambulatory Visit: Payer: Self-pay

## 2020-10-09 ENCOUNTER — Ambulatory Visit (INDEPENDENT_AMBULATORY_CARE_PROVIDER_SITE_OTHER): Payer: POS | Admitting: Pharmacist

## 2020-10-09 VITALS — BP 148/92 | HR 63 | Wt 194.2 lb

## 2020-10-09 DIAGNOSIS — I5042 Chronic combined systolic (congestive) and diastolic (congestive) heart failure: Secondary | ICD-10-CM

## 2020-10-09 MED ORDER — DAPAGLIFLOZIN PROPANEDIOL 10 MG PO TABS: 10.0000 mg | ORAL_TABLET | Freq: Every day | ORAL | 1 refills | Status: DC

## 2020-10-09 MED ORDER — ENTRESTO 49-51 MG PO TABS: 1.0000 | ORAL_TABLET | Freq: Two times a day (BID) | ORAL | 6 refills | Status: AC

## 2020-10-09 NOTE — Patient Instructions (Addendum)
It was good seeing you again  We would like your blood pressure to be less than 130/80  Please continue to check your blood pressure and weight  Since you can not tolerate the Entresto high dose, we will reduce it to the lower dose of 49-51mg  twice a day  Continue spironolactone 25mg  once a day Continue metoprolol 25mg  once a day Continue furosemide 20mg  once a day  We will start a new medication called Wilder Glade which you will take one time a day in the morning  Please call with any questions!  Karren Cobble, PharmD, BCACP, [place], [place] [address] [place], [place] [address] Phone: [phone]; Fax: [phone] 10/09/2020 10:54 AM

## 2020-10-10 ENCOUNTER — Telehealth: Payer: Self-pay | Admitting: Pharmacist

## 2020-10-10 NOTE — Telephone Encounter (Signed)
Patient called stating she went to get her Wilder Glade from pharmacy but it needed a PA. PA completed and medication approved.  Patient made aware

## 2020-10-16 ENCOUNTER — Encounter (INDEPENDENT_AMBULATORY_CARE_PROVIDER_SITE_OTHER): Payer: Self-pay | Admitting: Family Medicine

## 2020-10-16 ENCOUNTER — Ambulatory Visit (INDEPENDENT_AMBULATORY_CARE_PROVIDER_SITE_OTHER): Payer: POS | Admitting: Family Medicine

## 2020-10-16 ENCOUNTER — Other Ambulatory Visit: Payer: Self-pay

## 2020-10-16 VITALS — BP 114/73 | HR 76 | Temp 97.9°F | Ht 63.0 in | Wt 188.0 lb

## 2020-10-16 DIAGNOSIS — E669 Obesity, unspecified: Secondary | ICD-10-CM

## 2020-10-16 DIAGNOSIS — Z6833 Body mass index (BMI) 33.0-33.9, adult: Secondary | ICD-10-CM | POA: Diagnosis not present

## 2020-10-16 DIAGNOSIS — E1169 Type 2 diabetes mellitus with other specified complication: Secondary | ICD-10-CM

## 2020-10-16 NOTE — Progress Notes (Signed)
Chief Complaint:   OBESITY Lindsay Hayden is here to discuss her progress with her obesity treatment plan along with follow-up of her obesity related diagnoses. Lindsay Hayden is on the Category 2 Plan + 100 calories and states she is following her eating plan approximately 90% of the time. Lindsay Hayden states she is doing 0 minutes 0 times per week.  Today's visit was #: 18 Starting weight: 198 lbs Starting date: 12/13/2019 Today's weight: 188 lbs  Today's date: 10/16/2020 Total lbs lost to date: 10 Total lbs lost since last in-office visit: 1  Interim History: Lindsay Hayden has been struggling with change in dose of Entresto. She feels this has caused her to put on fluid weight.  She notes her hunger is satisfied. She is having milk instead of 2 oz of protein at supper.  Subjective:   1. Type 2 diabetes mellitus with other specified complication, without long-term current use of insulin (HCC) Criss was recently placed on Farxiga for heart failure. Her diabetes mellitus is well controlled. Last A1c was 6.3. She denies polyphagia or hypoglycemia.  Lab Results  Component Value Date   HGBA1C 6.3 (H) 08/29/2020   HGBA1C 6.2 (H) 04/10/2020   HGBA1C 6.6 (H) 12/13/2019   Lab Results  Component Value Date   LDLCALC 110 (H) 08/29/2020   CREATININE 0.84 09/11/2020   Lab Results  Component Value Date   INSULIN 13.0 08/29/2020   INSULIN 11.0 04/10/2020   INSULIN 25.6 (H) 12/13/2019   Assessment/Plan:   1. Type 2 diabetes mellitus with other specified complication, without long-term current use of insulin (HCC)  Reneka will continue [place] per Cardiology.  2. Class 1 obesity with serious comorbidity and body mass index (BMI) of 33.0 to 33.9 in adult, unspecified obesity type Lindsay Hayden is currently in the action stage of change. As such, her goal is to continue with weight loss efforts. She has agreed to the Category 2 Plan and keeping a food journal and adhering to recommended  goals of 200-300 calories and 20 grams of protein at breakfast daily.   We discussed breakfast options ([place] oatmeal). Recipes: Egg muffins.  Exercise goals: No exercise has been prescribed at this time.  Behavioral modification strategies: increasing lean protein intake, decreasing simple carbohydrates and meal planning and cooking strategies.  Lindsay Hayden has agreed to follow-up with our clinic in 3 weeks.   Objective:   Blood pressure 114/73, pulse 76, temperature 97.9 F (36.6 C), height 5\' 3"  (1.6 m), weight 188 lb (85.3 kg), last menstrual period 07/16/2017, SpO2 98 %. Body mass index is 33.3 kg/m.  General: Cooperative, alert, well developed, in no acute distress. HEENT: Conjunctivae and lids unremarkable. Cardiovascular: Regular rhythm.  Lungs: Normal work of breathing. Neurologic: No focal deficits.   Lab Results  Component Value Date   CREATININE 0.84 09/11/2020   BUN 18 09/11/2020   NA 141 09/11/2020   K 4.5 09/11/2020   CL 104 09/11/2020   CO2 24 09/11/2020   Lab Results  Component Value Date   ALT 19 08/29/2020   AST 15 08/29/2020   ALKPHOS 99 08/29/2020   BILITOT 0.7 08/29/2020   Lab Results  Component Value Date   HGBA1C 6.3 (H) 08/29/2020   HGBA1C 6.2 (H) 04/10/2020   HGBA1C 6.6 (H) 12/13/2019   Lab Results  Component Value Date   INSULIN 13.0 08/29/2020   INSULIN 11.0 04/10/2020   INSULIN 25.6 (H) 12/13/2019   Lab Results  Component Value Date   TSH 1.790 12/13/2019  Lab Results  Component Value Date   CHOL 167 08/29/2020   HDL 45 08/29/2020   LDLCALC 110 (H) 08/29/2020   TRIG 62 08/29/2020   CHOLHDL 3.2 10/07/2019   Lab Results  Component Value Date   WBC 5.0 12/13/2019   HGB 13.6 12/13/2019   HCT 42.4 12/13/2019   MCV 87 12/13/2019   PLT 296 12/13/2019   No results found for: IRON, TIBC, FERRITIN  Attestation Statements:   Reviewed by clinician on day of visit: allergies, medications, problem list, medical history,  surgical history, family history, social history, and previous encounter notes.   Wilhemena Durie, am acting as Location manager for [employer], FNP-C.  I have reviewed the above documentation for accuracy and completeness, and I agree with the above. -  Georgianne Fick, FNP

## 2020-10-23 ENCOUNTER — Other Ambulatory Visit: Payer: Self-pay

## 2020-10-23 ENCOUNTER — Encounter: Payer: Self-pay | Admitting: Pharmacist

## 2020-10-23 ENCOUNTER — Ambulatory Visit (INDEPENDENT_AMBULATORY_CARE_PROVIDER_SITE_OTHER): Payer: POS | Admitting: Pharmacist

## 2020-10-23 VITALS — BP 118/66 | HR 60 | Wt 192.8 lb

## 2020-10-23 DIAGNOSIS — I5042 Chronic combined systolic (congestive) and diastolic (congestive) heart failure: Secondary | ICD-10-CM

## 2020-10-23 NOTE — Progress Notes (Signed)
Patient ID: Lindsay Hayden                 DOB: Mar 15, 1961                      MRN: [id number]     HPI: Lindsay Hayden is a 60 y.o. female referred by Dr. Radford Pax for CHF management.  PMH is significant forCHF, HTN chronic LBBB, OSA, DM, hyporthyroidism, and obesity.  Last EF 43%.  At last visit with Dr Radford Pax on 12/13, Lindsay Hayden was increased to 97-103 and lab work was scheduled.  Pt was last seen in pharmacy clinic on 10/09/20 for CHF management visit. During visit, she denied headache, chest pain, blurry vision and SOB. Pt BP slighShe reports that she decreased Entresto 97-103 mg BID to taking once daily secondary to insomnia. She felt this has improved since taking Entresto once a day. She continues metoprolol succinate 25 mg daily, furosemide 20 mg daily, and spironolactone 25 mg daily. Blood pressure increased and it was decided to reduce dose of Entresto to 49-51 mg BID and add Farxiga 10mg  once daily.  Patient presents today in good spirits.  Is tolerating Farxiga 10mg  and reduced dose of Entresto. Test her BP at home daily (forgot log) and says BP averages between 115-128/60s.  Reports no SOB, swelling, or chest pain.  Is weighing herself daily with no significant weight gain or edema.  Reports she feels good.   Current HTN meds: Entresto 49-51 BID, furosemide 20mg  daily, Farxiga 10mg  daily, Toprol XL 25mg  daily, spironolactone 25mg  daily  Previously tried: Entresto 97-103 BID BP goal: <130/80  Wt Readings from Last 3 Encounters:  10/16/20 188 lb (85.3 kg)  10/09/20 194 lb 3.2 oz (88.1 kg)  09/25/20 189 lb (85.7 kg)   BP Readings from Last 3 Encounters:  10/16/20 114/73  10/09/20 (!) 148/92  09/25/20 127/83   Pulse Readings from Last 3 Encounters:  10/16/20 76  10/09/20 63  09/25/20 68    Renal function: CrCl cannot be calculated (Patient's most recent lab result is older than the maximum 21 days allowed.).  Past Medical History:  Diagnosis Date  . Anemia   .  Arthritis   . Asthma   . Cardiomyopathy ([place])   . Chronic combined systolic and diastolic CHF (congestive heart failure) ([place])   . Diabetes mellitus without complication ([place])   . Hypercalcemia   . Hyperlipidemia   . Hypertension   . Hypothyroid   . LBBB (left bundle branch block)   . LVH (left ventricular hypertrophy)   . NICM (nonischemic cardiomyopathy) ([place])   . OSA (obstructive sleep apnea)   . Overweight   . Prediabetes   . Shortness of breath   . Sleep apnea     Current Outpatient Medications on File Prior to Visit  Medication Sig Dispense Refill  . acetaminophen (TYLENOL) 650 MG CR tablet Take 650 mg by mouth every 8 (eight) hours as needed for pain.    Marland Kitchen albuterol (PROVENTIL HFA;VENTOLIN HFA) 108 (90 Base) MCG/ACT inhaler Inhale into the lungs every 6 (six) hours as needed for wheezing or shortness of breath.    Marland Kitchen atorvastatin (LIPITOR) 20 MG tablet Take 1 tablet (20 mg total) by mouth daily. 90 tablet 0  . Cholecalciferol (VITAMIN D) 125 MCG (5000 UT) CAPS Take by mouth 5 days per week. 30 capsule 0  . dapagliflozin propanediol (FARXIGA) 10 MG TABS tablet Take 1 tablet (10 mg total) by mouth daily. 90 tablet 1  .  fluticasone (FLOVENT HFA) 110 MCG/ACT inhaler 1 puff    . furosemide (LASIX) 20 MG tablet Take 1 tablet (20 mg total) by mouth daily. 90 tablet 3  . LEVOXYL 88 MCG tablet Take 1 tablet by mouth daily.    . meclizine (ANTIVERT) 25 MG tablet Take 1 tablet (25 mg total) by mouth 3 (three) times daily as needed for dizziness. 30 tablet 0  . metoprolol succinate (TOPROL-XL) 25 MG 24 hr tablet Take 1 tablet (25 mg total) by mouth daily. 90 tablet 2  . naproxen sodium (ALEVE) 220 MG tablet Take 220 mg by mouth daily as needed.     . sacubitril-valsartan (ENTRESTO) 49-51 MG Take 1 tablet by mouth 2 (two) times daily. 60 tablet 6  . spironolactone (ALDACTONE) 25 MG tablet Take 1 tablet (25 mg total) by mouth daily. 90 tablet 2  . tiZANidine (ZANAFLEX) 4 MG tablet Take 4 mg  by mouth as needed. As needed for muscle spasms     No current facility-administered medications on file prior to visit.    No Known Allergies   Assessment/Plan:  1. CHF  - Patient BP in room today 11/66 which [place] at goal of <130/80 and patient feels well.  Can not titrate up Entresto due to intolerance and do not want to titrate metoprolol at this time due to patient's hart rate.  Will continue current regimen but check BMP. Possibly may be able to d/c furosemide.  Will continue current medications and since patient is stable, recheck in clinic in 2 months.  Continyue Entresto 49-51 BID Continue spironolactone 25mg  daily Continue Farxiga 10mg  daily Continue Toprol XL 25mg  daily Continue furosemide 20 mg daily Check BMP Recheck in 2 months.  Karren Cobble, PharmD, BCACP, [place], [place] [address] [place], [place] [address] Phone: [phone]; Fax: [phone] 10/23/2020 2:49 PM

## 2020-10-23 NOTE — Patient Instructions (Addendum)
It was good seeing you again today!  We would to keep your blood pressure less than 130/80  Please continue your:  Lindsay Hayden 49-51 twice a day Spironolactone 25 mg once a day Furosemide 20mg  once a day Farxiga 10mg  once a day Metoprolol 25mg  once a day  Continue to monitor your weights and blood pressure at home  We will recheck your lab work today  Please call with any questions!  Karren Cobble, PharmD, BCACP, [place], [place] [address] [place], [place] [address] Phone: [phone]; Fax: [phone] 10/23/2020 1:44 PM

## 2020-10-24 LAB — BASIC METABOLIC PANEL
BUN/Creatinine Ratio: 22 (ref 9–23)
BUN: 24 mg/dL (ref 6–24)
CO2: 20 mmol/L (ref 20–29)
Calcium: 10.3 mg/dL — ABNORMAL HIGH (ref 8.7–10.2)
Chloride: 107 mmol/L — ABNORMAL HIGH (ref 96–106)
Creatinine, Ser: 1.07 mg/dL — ABNORMAL HIGH (ref 0.57–1.00)
GFR calc Af Amer: 67 mL/min/{1.73_m2} (ref >59)
GFR calc non Af Amer: 58 mL/min/{1.73_m2} — ABNORMAL LOW (ref >59)
Glucose: 97 mg/dL (ref 65–99)
Potassium: 4.3 mmol/L (ref 3.5–5.2)
Sodium: 142 mmol/L (ref 134–144)

## 2020-11-06 ENCOUNTER — Other Ambulatory Visit: Payer: Self-pay

## 2020-11-06 ENCOUNTER — Encounter (INDEPENDENT_AMBULATORY_CARE_PROVIDER_SITE_OTHER): Payer: Self-pay | Admitting: Family Medicine

## 2020-11-06 ENCOUNTER — Ambulatory Visit (INDEPENDENT_AMBULATORY_CARE_PROVIDER_SITE_OTHER): Payer: POS | Admitting: Family Medicine

## 2020-11-06 ENCOUNTER — Other Ambulatory Visit: Payer: Self-pay | Admitting: Cardiology

## 2020-11-06 VITALS — BP 145/75 | HR 67 | Temp 97.9°F | Ht 63.0 in | Wt 193.0 lb

## 2020-11-06 DIAGNOSIS — Z6834 Body mass index (BMI) 34.0-34.9, adult: Secondary | ICD-10-CM | POA: Diagnosis not present

## 2020-11-06 DIAGNOSIS — I5042 Chronic combined systolic (congestive) and diastolic (congestive) heart failure: Secondary | ICD-10-CM

## 2020-11-06 DIAGNOSIS — E669 Obesity, unspecified: Secondary | ICD-10-CM

## 2020-11-06 DIAGNOSIS — J4530 Mild persistent asthma, uncomplicated: Secondary | ICD-10-CM | POA: Diagnosis not present

## 2020-11-06 DIAGNOSIS — Z9189 Other specified personal risk factors, not elsewhere classified: Secondary | ICD-10-CM | POA: Diagnosis not present

## 2020-11-07 NOTE — Progress Notes (Signed)
Chief Complaint:   OBESITY Lindsay Hayden is here to discuss her progress with her obesity treatment plan along with follow-up of her obesity related diagnoses. Lindsay Hayden is on the Category 2 Plan and keeping a food journal and adhering to recommended goals of 200-300 calories and 20 grams of protein at breakfast daily and states she is following her eating plan approximately 90% of the time. Lindsay Hayden states she is doing 0 minutes 0 times per week.  Today's visit was #: 81 Starting weight: 198 lbs Starting date: 12/13/2019 Today's weight: 193 lbs Today's date: 11/06/2020 Total lbs lost to date: 5 Total lbs lost since last in-office visit: 0  Interim History: Jacquenline feels she is getting in all of the protein on the plan. She feels she keeps extra calories at <200. She is unsure why she is up 5 lbs today. She has noticed a gradual increase in weight over the last few weeks.  Breakfast is 2 eggs, [place] bacon, bread, and jelly. Lunch is 4 oz of [place] meat on bread with mustard and an apple. Dinner is 8 oz of meat, vegetables, and 1/2 cup of rice. Snacks are 100 calorie almonds, and sometimes has a [place] bar.  She denies drinking sugar sweetened beverages.  Subjective:   1. Mild persistent asthma, uncomplicated Lindsay Hayden notes wheezing that began yesterday. She has not taken Flovent for >1 week. She has not used albuterol rescue inhalerfor a while.  2. Chronic combined systolic and diastolic CHF (congestive heart failure) (HCC) Lindsay Hayden is compliant with all her medications. She is not weighing daily. She denies shortness of breath, but she does note wheezing. She is up 3-4 lbs of fluid per our bioimpedance since her last office visit.  3. At risk for fluid volume overload Lindsay Hayden is at a higher than average risk for fluid retention due to dx of CHF.  Reviewed: no chest pain on exertion, no dyspnea at rest, and no swelling of ankles.  Assessment/Plan:   1. Mild persistent  asthma, uncomplicated Lindsay Hayden will start using Flovent daily as prescribed.   2. Chronic combined systolic and diastolic CHF (congestive heart failure) (HCC) Lindsay Hayden will take one 20 mg Lasix when she gets home today. She is to do daily weights as directed, and she is to call Cardiology if her weight increases up to >3 lbs in 24 hours.    3. At risk for fluid volume overload Lindsay Hayden was given approximately 15 minutes of fluid retention prevention counseling today. She is 60 y.o. female and has risk factors for fluid retention including obesity. We discussed intensive lifestyle modifications today with an emphasis on specific weight loss instructions, proper nutrition and exercise strategies.   Repetitive spaced learning was employed today to elicit superior memory formation and behavioral change.  4. Class 1 obesity with serious comorbidity and body mass index (BMI) of 34.0 to 34.9 in adult, unspecified obesity type Lindsay Hayden is currently in the action stage of change. As such, her goal is to continue with weight loss efforts. She has agreed to the Category 2 Plan.   Exercise goals: No exercise has been prescribed at this time.  Behavioral modification strategies: decreasing simple carbohydrates.  Lindsay Hayden has agreed to follow-up with our clinic in 3 weeks.   Objective:   Blood pressure (!) 145/75, pulse 67, temperature 97.9 F (36.6 C), height 5\' 3"  (1.6 m), weight 193 lb (87.5 kg), last menstrual period 07/16/2017, SpO2 96 %. Body mass index is 34.19 kg/m.  General: Cooperative, alert, well  developed, in no acute distress. HEENT: Conjunctivae and lids unremarkable. Cardiovascular: Regular rhythm. Negative LEE.  Lungs: Inspiratory wheezing noted throughout. No rales or rhonchi.  Neurologic: No focal deficits.   Lab Results  Component Value Date   CREATININE 1.07 (H) 10/23/2020   BUN 24 10/23/2020   NA 142 10/23/2020   K 4.3 10/23/2020   CL 107 (H) 10/23/2020   CO2  20 10/23/2020   Lab Results  Component Value Date   ALT 19 08/29/2020   AST 15 08/29/2020   ALKPHOS 99 08/29/2020   BILITOT 0.7 08/29/2020   Lab Results  Component Value Date   HGBA1C 6.3 (H) 08/29/2020   HGBA1C 6.2 (H) 04/10/2020   HGBA1C 6.6 (H) 12/13/2019   Lab Results  Component Value Date   INSULIN 13.0 08/29/2020   INSULIN 11.0 04/10/2020   INSULIN 25.6 (H) 12/13/2019   Lab Results  Component Value Date   TSH 1.790 12/13/2019   Lab Results  Component Value Date   CHOL 167 08/29/2020   HDL 45 08/29/2020   LDLCALC 110 (H) 08/29/2020   TRIG 62 08/29/2020   CHOLHDL 3.2 10/07/2019   Lab Results  Component Value Date   WBC 5.0 12/13/2019   HGB 13.6 12/13/2019   HCT 42.4 12/13/2019   MCV 87 12/13/2019   PLT 296 12/13/2019   No results found for: IRON, TIBC, FERRITIN   Attestation Statements:   Reviewed by clinician on day of visit: allergies, medications, problem list, medical history, surgical history, family history, social history, and previous encounter notes.   Lindsay Hayden, am acting as Location manager for [employer], FNP-C.  I have reviewed the above documentation for accuracy and completeness, and I agree with the above. -  Lindsay Fick, FNP

## 2020-11-08 ENCOUNTER — Encounter (INDEPENDENT_AMBULATORY_CARE_PROVIDER_SITE_OTHER): Payer: Self-pay | Admitting: Family Medicine

## 2020-11-08 DIAGNOSIS — I5042 Chronic combined systolic (congestive) and diastolic (congestive) heart failure: Secondary | ICD-10-CM | POA: Insufficient documentation

## 2020-11-14 ENCOUNTER — Other Ambulatory Visit: Payer: Self-pay | Admitting: Family Medicine

## 2020-11-14 DIAGNOSIS — Z1231 Encounter for screening mammogram for malignant neoplasm of breast: Secondary | ICD-10-CM

## 2020-11-28 ENCOUNTER — Ambulatory Visit (INDEPENDENT_AMBULATORY_CARE_PROVIDER_SITE_OTHER): Payer: POS | Admitting: Family Medicine

## 2020-11-28 ENCOUNTER — Other Ambulatory Visit: Payer: Self-pay

## 2020-11-28 ENCOUNTER — Encounter (INDEPENDENT_AMBULATORY_CARE_PROVIDER_SITE_OTHER): Payer: Self-pay | Admitting: Family Medicine

## 2020-11-28 VITALS — BP 120/67 | HR 64 | Temp 98.5°F | Ht 63.0 in | Wt 190.0 lb

## 2020-11-28 DIAGNOSIS — Z6833 Body mass index (BMI) 33.0-33.9, adult: Secondary | ICD-10-CM

## 2020-11-28 DIAGNOSIS — E1169 Type 2 diabetes mellitus with other specified complication: Secondary | ICD-10-CM

## 2020-11-28 DIAGNOSIS — E669 Obesity, unspecified: Secondary | ICD-10-CM

## 2020-11-30 ENCOUNTER — Encounter (INDEPENDENT_AMBULATORY_CARE_PROVIDER_SITE_OTHER): Payer: Self-pay | Admitting: Family Medicine

## 2020-11-30 NOTE — Progress Notes (Signed)
Chief Complaint:   OBESITY Lindsay Hayden is here to discuss her progress with her obesity treatment plan along with follow-up of her obesity related diagnoses. Lindsay Hayden is on the Category 2 Plan and states she is following her eating plan approximately 90% of the time. Lindsay Hayden states she is not exercising at this time.  Today's visit was #: 20 Starting weight: 198 lbs Starting date: 12/13/2019 Today's weight: 190 lbs Today's date: 11/28/2020 Total lbs lost to date: 8 lbs Total lbs lost since last in-office visit: 3 lbs  Interim History: Hunger is well satisfied for the most part.  She is having the sandwich option for lunch and eggs at breakfast.  She is doing daily weights for CHF.  Weight fluctuates some, but not >3 pounds in a day.  Subjective:   1. Type 2 diabetes mellitus with other specified complication, without long-term current use of insulin ([place]) Put Farxiga by cardiology for CHF.  Denies polyphagia but does have some mild hunger in the afternoon.  Last A1c at goal (6.3).  Lab Results  Component Value Date   HGBA1C 6.3 (H) 08/29/2020   HGBA1C 6.2 (H) 04/10/2020   HGBA1C 6.6 (H) 12/13/2019   Lab Results  Component Value Date   LDLCALC 110 (H) 08/29/2020   CREATININE 1.07 (H) 10/23/2020   Lab Results  Component Value Date   INSULIN 13.0 08/29/2020   INSULIN 11.0 04/10/2020   INSULIN 25.6 (H) 12/13/2019   Assessment/Plan:   1. Type 2 diabetes mellitus with other specified complication, without long-term current use of insulin ([place]) Discussed Ozempic and she declines at this time.  2. Class 1 obesity with serious comorbidity and body mass index (BMI) of 33.0 to 33.9 in adult, unspecified obesity type  Lindsay Hayden is currently in the action stage of change. As such, her goal is to continue with weight loss efforts. She has agreed to the Category 2 Plan.   Exercise goals: Walk on days off for 30 minutes.  Behavioral modification strategies: meal planning  and cooking strategies and better snacking choices.  Lindsay Hayden has agreed to follow-up with our clinic in 3 weeks.   Objective:   Blood pressure 120/67, pulse 64, temperature 98.5 F (36.9 C), height 5\' 3"  (1.6 m), weight 190 lb (86.2 kg), last menstrual period 07/16/2017, SpO2 97 %. Body mass index is 33.66 kg/m.  General: Cooperative, alert, well developed, in no acute distress. HEENT: Conjunctivae and lids unremarkable. Cardiovascular: Regular rhythm.  Lungs: Normal work of breathing. Neurologic: No focal deficits.   Lab Results  Component Value Date   CREATININE 1.07 (H) 10/23/2020   BUN 24 10/23/2020   NA 142 10/23/2020   K 4.3 10/23/2020   CL 107 (H) 10/23/2020   CO2 20 10/23/2020   Lab Results  Component Value Date   ALT 19 08/29/2020   AST 15 08/29/2020   ALKPHOS 99 08/29/2020   BILITOT 0.7 08/29/2020   Lab Results  Component Value Date   HGBA1C 6.3 (H) 08/29/2020   HGBA1C 6.2 (H) 04/10/2020   HGBA1C 6.6 (H) 12/13/2019   Lab Results  Component Value Date   INSULIN 13.0 08/29/2020   INSULIN 11.0 04/10/2020   INSULIN 25.6 (H) 12/13/2019   Lab Results  Component Value Date   TSH 1.790 12/13/2019   Lab Results  Component Value Date   CHOL 167 08/29/2020   HDL 45 08/29/2020   LDLCALC 110 (H) 08/29/2020   TRIG 62 08/29/2020   CHOLHDL 3.2 10/07/2019   Lab  Results  Component Value Date   WBC 5.0 12/13/2019   HGB 13.6 12/13/2019   HCT 42.4 12/13/2019   MCV 87 12/13/2019   PLT 296 12/13/2019   Attestation Statements:   Reviewed by clinician on day of visit: allergies, medications, problem list, medical history, surgical history, family history, social history, and previous encounter notes.  I, Water quality scientist, CMA, am acting as Location manager for [employer], [place].  I have reviewed the above documentation for accuracy and completeness, and I agree with the above. -  Georgianne Fick, FNP

## 2020-12-06 ENCOUNTER — Other Ambulatory Visit (INDEPENDENT_AMBULATORY_CARE_PROVIDER_SITE_OTHER): Payer: Self-pay | Admitting: Family Medicine

## 2020-12-06 DIAGNOSIS — E7849 Other hyperlipidemia: Secondary | ICD-10-CM

## 2020-12-06 NOTE — Telephone Encounter (Signed)
Last seen by Dawn Whitmire, FNP 

## 2020-12-19 ENCOUNTER — Other Ambulatory Visit: Payer: Self-pay

## 2020-12-19 ENCOUNTER — Encounter (INDEPENDENT_AMBULATORY_CARE_PROVIDER_SITE_OTHER): Payer: Self-pay | Admitting: Family Medicine

## 2020-12-19 ENCOUNTER — Ambulatory Visit (INDEPENDENT_AMBULATORY_CARE_PROVIDER_SITE_OTHER): Payer: POS | Admitting: Family Medicine

## 2020-12-19 VITALS — BP 117/77 | HR 59 | Temp 98.0°F | Ht 63.0 in | Wt 191.0 lb

## 2020-12-19 DIAGNOSIS — Z6835 Body mass index (BMI) 35.0-35.9, adult: Secondary | ICD-10-CM | POA: Diagnosis not present

## 2020-12-19 DIAGNOSIS — E1169 Type 2 diabetes mellitus with other specified complication: Secondary | ICD-10-CM

## 2020-12-21 ENCOUNTER — Encounter (INDEPENDENT_AMBULATORY_CARE_PROVIDER_SITE_OTHER): Payer: Self-pay

## 2020-12-25 ENCOUNTER — Encounter (INDEPENDENT_AMBULATORY_CARE_PROVIDER_SITE_OTHER): Payer: Self-pay | Admitting: Family Medicine

## 2020-12-25 NOTE — Progress Notes (Signed)
Chief Complaint:   OBESITY Lindsay Hayden is here to discuss her progress with her obesity treatment plan along with follow-up of her obesity related diagnoses. Lindsay Hayden is on the Category 2 Plan and states she is following her eating plan approximately 90% of the time. Lindsay Hayden states she is not exercising regularly at this time.  Today's visit was #: 21 Starting weight: 198 lbs Starting date: 12/13/2019 Today's weight: 191 lbs Today's date: 12/19/2020 Total lbs lost to date: 7 lbs Total lbs lost since last in-office visit: +1  Interim History: Lindsay Hayden recently celebrated her mother's birthday in [place] and notes she was off plan at least 3 days.  She is not back to routine.  Hunger is satisfied.  No sugar-sweetened beverages.  For breakfast, she is doing the egg option.  For lunch, sandwich and fruit.  Dinner is meat and vegetables with occasional rice.  Subjective:   1. Type 2 diabetes mellitus with other specified complication, without long-term current use of insulin ([facility]) Well controlled. On Farxiga for CHF.  She does not check CBGs at home.  Last A1c was 6.3.  Lab Results  Component Value Date   HGBA1C 6.3 (H) 08/29/2020   HGBA1C 6.2 (H) 04/10/2020   HGBA1C 6.6 (H) 12/13/2019   Lab Results  Component Value Date   LDLCALC 110 (H) 08/29/2020   CREATININE 1.07 (H) 10/23/2020   Lab Results  Component Value Date   INSULIN 13.0 08/29/2020   INSULIN 11.0 04/10/2020   INSULIN 25.6 (H) 12/13/2019   Assessment/Plan:   1. Type 2 diabetes mellitus with other specified complication, without long-term current use of insulin ([place]) Continue Farxiga.  2. Obesity: BMI 33  Lindsay Hayden is currently in the action stage of change. As such, her goal is to continue with weight loss efforts. She has agreed to the Category 2 Plan and keeping a food journal and adhering to recommended goals of 400-500 calories and 35 grams of protein at supper.   Exercise goals: No exercise has been  prescribed at this time.   May journal at supper.  Handout provided:  Recipes.  Behavioral modification strategies: meal planning and cooking strategies.  Lindsay Hayden has agreed to follow-up with our clinic in 3 weeks.   Objective:   Blood pressure 117/77, pulse (!) 59, temperature 98 F (36.7 C), height 5\' 3"  (1.6 m), weight 191 lb (86.6 kg), last menstrual period 07/16/2017, SpO2 95 %. Body mass index is 33.83 kg/m.  General: Cooperative, alert, well developed, in no acute distress. HEENT: Conjunctivae and lids unremarkable. Cardiovascular: Regular rhythm.  Lungs: Normal work of breathing. Neurologic: No focal deficits.   Lab Results  Component Value Date   CREATININE 1.07 (H) 10/23/2020   BUN 24 10/23/2020   NA 142 10/23/2020   K 4.3 10/23/2020   CL 107 (H) 10/23/2020   CO2 20 10/23/2020   Lab Results  Component Value Date   ALT 19 08/29/2020   AST 15 08/29/2020   ALKPHOS 99 08/29/2020   BILITOT 0.7 08/29/2020   Lab Results  Component Value Date   HGBA1C 6.3 (H) 08/29/2020   HGBA1C 6.2 (H) 04/10/2020   HGBA1C 6.6 (H) 12/13/2019   Lab Results  Component Value Date   INSULIN 13.0 08/29/2020   INSULIN 11.0 04/10/2020   INSULIN 25.6 (H) 12/13/2019   Lab Results  Component Value Date   TSH 1.790 12/13/2019   Lab Results  Component Value Date   CHOL 167 08/29/2020   HDL 45 08/29/2020  LDLCALC 110 (H) 08/29/2020   TRIG 62 08/29/2020   CHOLHDL 3.2 10/07/2019   Lab Results  Component Value Date   WBC 5.0 12/13/2019   HGB 13.6 12/13/2019   HCT 42.4 12/13/2019   MCV 87 12/13/2019   PLT 296 12/13/2019   Attestation Statements:   Reviewed by clinician on day of visit: allergies, medications, problem list, medical history, surgical history, family history, social history, and previous encounter notes.  I, Water quality scientist, CMA, am acting as Location manager for [employer], [place].  I have reviewed the above documentation for accuracy and completeness, and  I agree with the above. -  Georgianne Fick, FNP

## 2020-12-26 ENCOUNTER — Ambulatory Visit (INDEPENDENT_AMBULATORY_CARE_PROVIDER_SITE_OTHER): Payer: POS | Admitting: Pharmacist

## 2020-12-26 ENCOUNTER — Other Ambulatory Visit: Payer: Self-pay

## 2020-12-26 VITALS — BP 110/70 | HR 67 | Wt 195.8 lb

## 2020-12-26 DIAGNOSIS — I5042 Chronic combined systolic (congestive) and diastolic (congestive) heart failure: Secondary | ICD-10-CM | POA: Diagnosis not present

## 2020-12-26 MED ORDER — FUROSEMIDE 20 MG PO TABS: ORAL_TABLET | ORAL | 1 refills | Status: DC

## 2020-12-26 MED ORDER — DAPAGLIFLOZIN PROPANEDIOL 10 MG PO TABS: 10.0000 mg | ORAL_TABLET | Freq: Every day | ORAL | 1 refills | Status: DC

## 2020-12-26 MED ORDER — SPIRONOLACTONE 25 MG PO TABS: 1.0000 | ORAL_TABLET | Freq: Every day | ORAL | 1 refills | Status: DC

## 2020-12-26 MED ORDER — SACUBITRIL-VALSARTAN 49-51 MG PO TABS: 1.0000 | ORAL_TABLET | Freq: Two times a day (BID) | ORAL | 1 refills | Status: DC

## 2020-12-26 MED ORDER — METOPROLOL SUCCINATE ER 25 MG PO TB24: 1.0000 | ORAL_TABLET | Freq: Every day | ORAL | 3 refills | Status: DC

## 2020-12-26 NOTE — Progress Notes (Signed)
Patient ID: Lindsay Hayden                 DOB: 1960/11/15                      MRN: [id number]     HPI: Lindsay Hayden is a 60 y.o. female referred by Dr. Radford Pax for CHF management. PMH is significant for HTN, CHF, DM, HLD.  This is her fourth visit to pharmacy clinic for CHF management.    Patient presents today in good spirits.  Was unable to tolerate Entresto 97-103 but has done well on 49-51mg  BID.  Wilder Glade was introduced in January and patient has been tolerating without incident. Remains on metoprolol 25mg  daily (unable to titrate up due to bradycardia) and spironolactone 25mg .  [facility]es [facility] 2 weeks and has been gradually losing weight.  Reports no chest pain, dizziness, or SOB.  Had incident where her left foot and ankle began swelling about a month ago but this has resolved.  Is not sure what was underlying cause but she thinks it may have been diet related.  Weight has remained steady, ranging between 189 and 194 through last 2 months.    Takes BP twice daily.  Brought log:   4/12: 120/66 4/11: 124/64, 135/79 4/10: 124/63, 126/78 4/9: 124/70, 137/64 4/8: 131/73, 139/76 4/7: 122/65, 130/82 4/6: 115/49, 126/54 4/5: 139/77, 137/78  Has follow up appointment with Dr Radford Pax in June.  Current CHF meds: Farxiga 10mg , furosemide 20mg , metoprolol 25mg  daily, spironolactone 25mg  daly. Entresto 49-51 Previously tried: enalapril 20 mg, hydralazine 25mg , Entresto 24-26, 97-103 BP goal: <130/80  Family History:   Social History:   Diet:   Exercise:   Home BP readings:   Wt Readings from Last 3 Encounters:  12/19/20 191 lb (86.6 kg)  11/28/20 190 lb (86.2 kg)  11/06/20 193 lb (87.5 kg)   BP Readings from Last 3 Encounters:  12/19/20 117/77  11/28/20 120/67  11/06/20 (!) 145/75   Pulse Readings from Last 3 Encounters:  12/19/20 (!) 59  11/28/20 64  11/06/20 67    Renal function: CrCl cannot be calculated (Patient's most recent lab  result is older than the maximum 21 days allowed.).  Past Medical History:  Diagnosis Date  . Anemia   . Arthritis   . Asthma   . Cardiomyopathy ([place])   . Chronic combined systolic and diastolic CHF (congestive heart failure) ([place])   . Diabetes mellitus without complication ([place])   . Hypercalcemia   . Hyperlipidemia   . Hypertension   . Hypothyroid   . LBBB (left bundle branch block)   . LVH (left ventricular hypertrophy)   . NICM (nonischemic cardiomyopathy) ([place])   . OSA (obstructive sleep apnea)   . Overweight   . Prediabetes   . Shortness of breath   . Sleep apnea     Current Outpatient Medications on File Prior to Visit  Medication Sig Dispense Refill  . acetaminophen (TYLENOL) 650 MG CR tablet Take 650 mg by mouth every 8 (eight) hours as needed for pain.    Marland Kitchen albuterol (PROVENTIL HFA;VENTOLIN HFA) 108 (90 Base) MCG/ACT inhaler Inhale into the lungs every 6 (six) hours as needed for wheezing or shortness of breath.    Marland Kitchen atorvastatin (LIPITOR) 20 MG tablet Take 1 tablet (20 mg total) by mouth daily. 90 tablet 0  . Cholecalciferol (VITAMIN D) 125 MCG (5000 UT) CAPS Take by mouth 5 days per week. 30 capsule  0  . dapagliflozin propanediol (FARXIGA) 10 MG TABS tablet Take 1 tablet (10 mg total) by mouth daily. 90 tablet 1  . fluticasone (FLOVENT HFA) 110 MCG/ACT inhaler 1 puff    . furosemide (LASIX) 20 MG tablet Take 1 tablet (20 mg total) by mouth daily. 90 tablet 3  . LEVOXYL 88 MCG tablet Take 1 tablet by mouth daily.    . meclizine (ANTIVERT) 25 MG tablet Take 1 tablet (25 mg total) by mouth 3 (three) times daily as needed for dizziness. 30 tablet 0  . metoprolol succinate (TOPROL-XL) 25 MG 24 hr tablet TAKE 1 TABLET DAILY 90 tablet 3  . naproxen sodium (ALEVE) 220 MG tablet Take 220 mg by mouth daily as needed.     Marland Kitchen spironolactone (ALDACTONE) 25 MG tablet TAKE 1 TABLET DAILY 90 tablet 3  . tiZANidine (ZANAFLEX) 4 MG tablet Take 4 mg by mouth as needed. As needed for  muscle spasms     No current facility-administered medications on file prior to visit.    No Known Allergies   Assessment/Plan:  1. CHF - Patient's BP 110/70 which is at goal of <130/80.  Majority of patient's home BP readings at goal also.  Pulse rate more elevated today than typical.  Had been running in high 50s, low 60s.  Will continue to hold off on increasing metoprolol at this time due to bradycardia risk.  Patient can not tolerate high dose Entresto so will remain on 49-51 BID.  Had previously considered reducing or discontinuing patient's furosemide since she had not had any swelling until this last month.  Unclear what cause was.  Patient requests to reduce her furosemide dose to 5 days a week to help with renal function and will call if she begins swelling again.  Patient requests refills and new copay card for [facility].  Continue Entresto 49-51mg  BID Continue spironolactone 25mg  daily Continue metoprolol 25mg  daily Continue Farxiga 10mg  daily Reduce furosemide to 20mg  5 days a week Follow up with Dr Radford Pax in 6 weeks  Karren Cobble, PharmD, BCACP, [place], [place] [address] [place],  [address] Phone: [phone]; Fax: [phone] 12/26/2020 4:32 PM

## 2020-12-26 NOTE — Patient Instructions (Addendum)
It was good seeing you today!  We would like to keep your blood pressure less than 130/80  Continue your Entresto 49-51mg  twice a day Continue your spironolactone 25 mg once a day Continue your metoprolol 25mg  once a day Continue your Farxiga 10mg  once a day Begin taking your furosemide 5 days a week  I will send in your refills for you  Please call with any questions or concerns  Continue to monitor your blood pressure and weights at home  Karren Cobble, PharmD, BCACP, [place], [place] [address], [place], [place] [address] Phone: [phone]; Fax: [phone] 12/26/2020 2:30 PM

## 2021-01-08 ENCOUNTER — Ambulatory Visit: Payer: POS

## 2021-01-08 ENCOUNTER — Ambulatory Visit
Admission: RE | Admit: 2021-01-08 | Discharge: 2021-01-08 | Disposition: A | Discharge status: Home / Self Care | Payer: POS | Source: Ambulatory Visit | Attending: Family Medicine | Admitting: Family Medicine

## 2021-01-08 ENCOUNTER — Other Ambulatory Visit: Payer: Self-pay

## 2021-01-08 DIAGNOSIS — Z1231 Encounter for screening mammogram for malignant neoplasm of breast: Secondary | ICD-10-CM

## 2021-01-09 ENCOUNTER — Ambulatory Visit (INDEPENDENT_AMBULATORY_CARE_PROVIDER_SITE_OTHER): Payer: POS | Admitting: Family Medicine

## 2021-01-22 ENCOUNTER — Other Ambulatory Visit: Payer: Self-pay

## 2021-01-22 ENCOUNTER — Ambulatory Visit (INDEPENDENT_AMBULATORY_CARE_PROVIDER_SITE_OTHER): Payer: POS | Admitting: Family Medicine

## 2021-01-22 ENCOUNTER — Encounter (INDEPENDENT_AMBULATORY_CARE_PROVIDER_SITE_OTHER): Payer: Self-pay | Admitting: Family Medicine

## 2021-01-22 VITALS — BP 117/71 | HR 61 | Temp 98.6°F | Ht 63.0 in | Wt 196.0 lb

## 2021-01-22 DIAGNOSIS — Z6835 Body mass index (BMI) 35.0-35.9, adult: Secondary | ICD-10-CM

## 2021-01-22 DIAGNOSIS — E1169 Type 2 diabetes mellitus with other specified complication: Secondary | ICD-10-CM | POA: Diagnosis not present

## 2021-01-22 NOTE — Progress Notes (Signed)
Chief Complaint:   OBESITY Lindsay Hayden is here to discuss her progress with her obesity treatment plan along with follow-up of her obesity related diagnoses. Lindsay Hayden is on the Category 2 Plan and keeping a food journal and adhering to recommended goals of 400-500 calories and 35 grams of protein at supper daily and states she is following her eating plan approximately 90% of the time. Lindsay Hayden states she is doing 0 minutes 0 times per week.  Today's visit was #: 22 Starting weight: 198 lbs Starting date: 12/13/2019 Today's weight: 196 lbs Today's date: 01/22/2021 Total lbs lost to date: 2 Total lbs lost since last in-office visit: 0  Interim History: Lindsay Hayden is up 5 lbs today. Her water weight is nearly the same as her last office visit. She is not drinking sugar sweetened beverages. She feels she is getting in prescribed protein. She sometimes overeats snack calories with sweets. She tends to snack after dinner.  She notes that she bought a treadmill recently but has not assembled it yet.  Subjective:   1. Type 2 diabetes mellitus with other specified complication, without long-term current use of insulin ([facility]) Lindsay Hayden is on Farixga and metformin. She denies excessive appetite. DM is well controlled. Last A1c was 6.3.  Lab Results  Component Value Date   HGBA1C 6.3 (H) 08/29/2020   HGBA1C 6.2 (H) 04/10/2020   HGBA1C 6.6 (H) 12/13/2019   Lab Results  Component Value Date   LDLCALC 110 (H) 08/29/2020   CREATININE 1.07 (H) 10/23/2020   Lab Results  Component Value Date   INSULIN 13.0 08/29/2020   INSULIN 11.0 04/10/2020   INSULIN 25.6 (H) 12/13/2019   Assessment/Plan:   1. Type 2 diabetes mellitus with other specified complication, without long-term current use of insulin ([facility]) Lindsay Hayden will continue Farixga and metformin.   2. Obesity: BMI 34.73 Lindsay Hayden is currently in the action stage of change. As such, her goal is to continue with weight loss efforts.  She has agreed to the Category 2 Plan and keeping a food journal and adhering to recommended goals of 400-500 calories and 35 grams of protein at supper daily.   We discussed the benefits of physical activity for CHF and DM.  Exercise goals: Lindsay Hayden will begin walking on the treadmill for 30 minutes 2 times per week.  Behavioral modification strategies: decreasing simple carbohydrates.  Lindsay Hayden has agreed to follow-up with our clinic in 3 weeks.   Objective:   Blood pressure 117/71, pulse 61, temperature 98.6 F (37 C), height 5\' 3"  (1.6 m), weight 196 lb (88.9 kg), last menstrual period 07/16/2017, SpO2 96 %. Body mass index is 34.72 kg/m.  General: Cooperative, alert, well developed, in no acute distress. HEENT: Conjunctivae and lids unremarkable. Cardiovascular: Regular rhythm.  Lungs: Normal work of breathing. Neurologic: No focal deficits.   Lab Results  Component Value Date   CREATININE 1.07 (H) 10/23/2020   BUN 24 10/23/2020   NA 142 10/23/2020   K 4.3 10/23/2020   CL 107 (H) 10/23/2020   CO2 20 10/23/2020   Lab Results  Component Value Date   ALT 19 08/29/2020   AST 15 08/29/2020   ALKPHOS 99 08/29/2020   BILITOT 0.7 08/29/2020   Lab Results  Component Value Date   HGBA1C 6.3 (H) 08/29/2020   HGBA1C 6.2 (H) 04/10/2020   HGBA1C 6.6 (H) 12/13/2019   Lab Results  Component Value Date   INSULIN 13.0 08/29/2020   INSULIN 11.0 04/10/2020   INSULIN 25.6 (H)  12/13/2019   Lab Results  Component Value Date   TSH 1.790 12/13/2019   Lab Results  Component Value Date   CHOL 167 08/29/2020   HDL 45 08/29/2020   LDLCALC 110 (H) 08/29/2020   TRIG 62 08/29/2020   CHOLHDL 3.2 10/07/2019   Lab Results  Component Value Date   WBC 5.0 12/13/2019   HGB 13.6 12/13/2019   HCT 42.4 12/13/2019   MCV 87 12/13/2019   PLT 296 12/13/2019   No results found for: IRON, TIBC, FERRITIN  Attestation Statements:   Reviewed by clinician on day of visit: allergies,  medications, problem list, medical history, surgical history, family history, social history, and previous encounter notes.   Wilhemena Durie, am acting as Location manager for [employer], FNP-C.  I have reviewed the above documentation for accuracy and completeness, and I agree with the above. -  Georgianne Fick, FNP

## 2021-01-23 ENCOUNTER — Encounter (INDEPENDENT_AMBULATORY_CARE_PROVIDER_SITE_OTHER): Payer: Self-pay | Admitting: Family Medicine

## 2021-02-14 ENCOUNTER — Ambulatory Visit (INDEPENDENT_AMBULATORY_CARE_PROVIDER_SITE_OTHER): Payer: POS | Admitting: Family Medicine

## 2021-02-19 ENCOUNTER — Encounter: Payer: Self-pay | Admitting: Cardiology

## 2021-02-19 ENCOUNTER — Ambulatory Visit (INDEPENDENT_AMBULATORY_CARE_PROVIDER_SITE_OTHER): Payer: POS | Admitting: Cardiology

## 2021-02-19 ENCOUNTER — Other Ambulatory Visit: Payer: Self-pay

## 2021-02-19 VITALS — BP 116/76 | HR 60 | Ht 63.0 in | Wt 203.4 lb

## 2021-02-19 DIAGNOSIS — G4733 Obstructive sleep apnea (adult) (pediatric): Secondary | ICD-10-CM | POA: Diagnosis not present

## 2021-02-19 DIAGNOSIS — I10 Essential (primary) hypertension: Secondary | ICD-10-CM

## 2021-02-19 DIAGNOSIS — E038 Other specified hypothyroidism: Secondary | ICD-10-CM

## 2021-02-19 DIAGNOSIS — I5042 Chronic combined systolic (congestive) and diastolic (congestive) heart failure: Secondary | ICD-10-CM | POA: Diagnosis not present

## 2021-02-19 DIAGNOSIS — I447 Left bundle-branch block, unspecified: Secondary | ICD-10-CM | POA: Diagnosis not present

## 2021-02-19 NOTE — Addendum Note (Signed)
Addended by: Antonieta Iba on: 02/19/2021 10:29 AM   Modules accepted: Orders

## 2021-02-19 NOTE — Patient Instructions (Signed)
Medication Instructions:  Your physician recommends that you continue on your current medications as directed. Please refer to the Current Medication list given to you today.  *If you need a refill on your cardiac medications before your next appointment, please call your pharmacy*   Testing/Procedures: Your physician has requested that you have an echocardiogram. Echocardiography is a painless test that uses sound waves to create images of your heart. It provides your doctor with information about the size and shape of your heart and how well your heart's chambers and valves are working. This procedure takes approximately one hour. There are no restrictions for this procedure.  Follow-Up: At [facility], you and your health needs are our priority.  As part of our continuing mission to provide you with exceptional heart care, we have created designated Provider Care Teams.  These Care Teams include your primary Cardiologist (physician) and Advanced Practice Providers (APPs -  Physician Assistants and Nurse Practitioners) who all work together to provide you with the care you need, when you need it.  Your next appointment:   6 month(s)  The format for your next appointment:   In Person  Provider:   You may see Fransico Him, MD or one of the following Advanced Practice Providers on your designated Care Team:    Melina Copa, PA-C  Ermalinda Barrios, PA-C

## 2021-02-19 NOTE — Progress Notes (Addendum)
Date:  02/19/2021   ID:  Lindsay Hayden, DOB 1961/02/08, MRN [id number]   PCP:  Lindsay Lass, MD  Cardiologist:  Lindsay Him, MD  Electrophysiologist:  None   Chief Complaint:  NICM  History of Present Illness:    Lindsay Hayden is a 60 y.o. 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  She was previously followed by Dr. Wynonia Hayden. She was found to have low EF in 2006 and  cath around that time as well as a few years later showed no evidence of of coronary obstruction. She was also found to be hypertensive at that time around 150/90s. She had no history of heavy ETOH or illicit drug use. No family history of cardiomyopathy. She reported a significant viral illness in 2001 but this was years before diagnosis of CHF.  F/u echo 03/2019 showed EF 35-40%, moderate LVH, pseudonormalization, LBBB, and normal RVSP. She was  previously on hydralazine but did not tolerate nighttime dosing due to tinnitus. Cardiac MRI 08/09/19 and showed moderate LVE with diffuse hypokinesis, EF 43%, no infarct/scar and no evidence of amyloidosis, mild MR, mild LAE, normal RV size and function.  We tried to up titrate her Entresto to 97-101mg  BID but she had problems sleeping and it was decreased back to 49-51mg  BID>   She is here today for followup and is doing well.  She denies any chest pain or pressure, SOB, DOE, PND, orthopnea, palpitations or syncope. Occasionally she will have some LE edema.  She occasionally will have some lightheadedness.  She is compliant with her meds and is tolerating meds with no SE.    She is doing well with her CPAP device and thinks that she has gotten used to it.  She tolerates the mask and feels the pressure is adequate.  Since going on CPAP she  feels rested in the am and has no significant daytime sleepiness.  She occasionally has some mild mouth dryness. She denies any significant nasal dryness or nasal congestion.  She does not think that he snores.     Prior CV studies:   The following studies were reviewed today:  PAP compliance download, labs, 2D echo 2020  Past Medical History:  Diagnosis Date  . Anemia   . Arthritis   . Asthma   . Cardiomyopathy ([place])   . Chronic combined systolic and diastolic CHF (congestive heart failure) ([place])   . Diabetes mellitus without complication ([place])   . Hypercalcemia   . Hyperlipidemia   . Hypertension   . Hypothyroid   . LBBB (left bundle branch block)   . LVH (left ventricular hypertrophy)   . NICM (nonischemic cardiomyopathy) ([place])   . OSA (obstructive sleep apnea)   . Overweight   . Prediabetes   . Shortness of breath   . Sleep apnea    Past Surgical History:  Procedure Laterality Date  . ACHILLES TENDON SURGERY     right  . CESAREAN SECTION     x2  . KNEE ARTHROSCOPY WITH MENISCAL REPAIR     right knee  . OVARIAN CYST REMOVAL     right     Current Meds  Medication Sig  . acetaminophen (TYLENOL) 650 MG CR tablet Take 650 mg by mouth every 8 (eight) hours as needed for pain.  Marland Kitchen albuterol (PROVENTIL  HFA;VENTOLIN HFA) 108 (90 Base) MCG/ACT inhaler Inhale into the lungs every 6 (six) hours as needed for wheezing or shortness of breath.  Marland Kitchen atorvastatin (LIPITOR) 20 MG tablet Take 1 tablet (20 mg total) by mouth daily.  . Cholecalciferol (VITAMIN D) 125 MCG (5000 UT) CAPS Take by mouth 5 days per week.  . dapagliflozin propanediol (FARXIGA) 10 MG TABS tablet Take 1 tablet (10 mg total) by mouth daily.  . fluticasone (FLOVENT HFA) 110 MCG/ACT inhaler Inhale 1 puff into the lungs daily.  . furosemide (LASIX) 20 MG tablet Take 1 tablet by mouth five days a week  . LEVOXYL 88 MCG tablet Take 1 tablet by mouth daily.  . meclizine (ANTIVERT) 25 MG tablet Take 1 tablet (25 mg  total) by mouth 3 (three) times daily as needed for dizziness.  . metoprolol succinate (TOPROL-XL) 25 MG 24 hr tablet Take 1 tablet (25 mg total) by mouth daily.  . naproxen sodium (ALEVE) 220 MG tablet Take 220 mg by mouth daily as needed.   . sacubitril-valsartan (ENTRESTO) 49-51 MG Take 1 tablet by mouth 2 (two) times daily.  Marland Kitchen spironolactone (ALDACTONE) 25 MG tablet Take 1 tablet (25 mg total) by mouth daily.  Marland Kitchen tiZANidine (ZANAFLEX) 4 MG tablet Take 4 mg by mouth as needed. As needed for muscle spasms     Allergies:   Patient has no known allergies.   Social History   Tobacco Use  . Smoking status: Never Smoker  . Smokeless tobacco: Never Used  Vaping Use  . Vaping Use: Never used  Substance Use Topics  . Alcohol use: Yes    Alcohol/week: 1.0 standard drink    Types: 1 Glasses of wine per week    Comment: 1-2 times a month  . Drug use: No     Family Hx: The patient's family history includes Alcohol abuse in her father; Cancer in her father; Diabetes in her mother; Hypertension in her father and mother; Obesity in her mother; Stroke in her mother.  ROS:   Please see the history of present illness.     All other systems reviewed and are negative.   Labs/Other Tests and Data Reviewed:    Recent Labs: 08/29/2020: ALT 19 10/23/2020: BUN 24; Creatinine, Ser 1.07; Potassium 4.3; Sodium 142   Recent Lipid Panel Lab Results  Component Value Date/Time   CHOL 167 08/29/2020 09:41 AM   TRIG 62 08/29/2020 09:41 AM   HDL 45 08/29/2020 09:41 AM   CHOLHDL 3.2 10/07/2019 08:47 AM   LDLCALC 110 (H) 08/29/2020 09:41 AM    Wt Readings from Last 3 Encounters:  02/19/21 203 lb 6.4 oz (92.3 kg)  01/22/21 196 lb (88.9 kg)  12/26/20 195 lb 12.8 oz (88.8 kg)     Objective:    Vital Signs:  BP 116/76   Pulse 60   Ht 5\' 3"  (1.6 m)   Wt 203 lb 6.4 oz (92.3 kg)   LMP 07/16/2017   SpO2 95%   BMI 36.03 kg/m    GEN: Well nourished, well developed in no acute distress HEENT:  Normal NECK: No JVD; No carotid bruits LYMPHATICS: No lymphadenopathy CARDIAC:RRR, no murmurs, rubs, gallops RESPIRATORY:  Clear to auscultation without rales, wheezing or rhonchi  ABDOMEN: Soft, non-tender, non-distended MUSCULOSKELETAL:  No edema; No deformity  SKIN: Warm and dry NEUROLOGIC:  Alert and oriented x 3 PSYCHIATRIC:  Normal affect    ASSESSMENT & PLAN:    1.  Chronic combined CHF/NICM -considered nonischemic as 2  caths have shown normal coronary arteries -Cardiac MRI 07/2019 showed no evidence of infiltrative disease including amyloid>>EF 43% -she does not appear volume overloaded on exam today -her weight is stable and she denies any SOB or LE edema -Continue prescription drug management with Toprol XL 25mg  daily, spiro 25mg  daily, Lasix 20mg  5 days weekly, Farxiga 10mg  daily and Entresto 49-51mg  BID>could not titrated entresto further due to inability to sleep on higher dose -cannot increase Toprol further due to bradycardia -repeat 2D echo to see if LVF has improved -I told her it was fine to take an extra dose of Lasix PRN for LE edema  2.  Essential HTN  -BP is well controlled on exam today -Continue prescription drug management with Toprol XL 25mg  daily, Arlyce Harman 25mg  daily and Entresto 49-51mg  BID -I have personally reviewed and interpreted outside labs performed by patient's PCP which showed 0.94, K+ 4.1 and TSH 11.55 in May 2022  3.  Chronic LBBB -normal coronary arteries on cath  4. OSA - The patient is tolerating PAP therapy well without any problems. The PAP download performed by his DME was personally reviewed and interpreted by me today and showed an AHI of 0.5/hr on auto CPAP  with 100% compliance in using more than 4 hours nightly.  The patient has been using and benefiting from PAP use and will continue to benefit from therapy.   5.  Morbid obesity -she has gained some weight in the past few months but likely related to hypothyroidism as she is still  following the diet she was given at healthy weight [facility] wellness clinic -encouraged her to try to get into an exercise program  6.  Hypothyroidism  -TSH was elevated when PCP checked it last -PCP managing  Followup with me in 6 months  Medication Adjustments/Labs and Tests Ordered: Current medicines are reviewed at length with the patient today.  Concerns regarding medicines are outlined above.  Tests Ordered: No orders of the defined types were placed in this encounter.  Medication Changes: No orders of the defined types were placed in this encounter.   Disposition:  Follow up in 6 month(s)  Signed, Lindsay Him, MD  02/19/2021 10:22 AM    [facility]

## 2021-03-20 ENCOUNTER — Encounter: Payer: Self-pay | Admitting: Cardiology

## 2021-03-20 ENCOUNTER — Other Ambulatory Visit: Payer: Self-pay

## 2021-03-20 ENCOUNTER — Ambulatory Visit (HOSPITAL_COMMUNITY): Payer: POS | Attending: Cardiology

## 2021-03-20 DIAGNOSIS — E038 Other specified hypothyroidism: Secondary | ICD-10-CM | POA: Diagnosis present

## 2021-03-20 DIAGNOSIS — G4733 Obstructive sleep apnea (adult) (pediatric): Secondary | ICD-10-CM | POA: Insufficient documentation

## 2021-03-20 DIAGNOSIS — I5042 Chronic combined systolic (congestive) and diastolic (congestive) heart failure: Secondary | ICD-10-CM | POA: Insufficient documentation

## 2021-03-20 DIAGNOSIS — I447 Left bundle-branch block, unspecified: Secondary | ICD-10-CM | POA: Insufficient documentation

## 2021-03-20 DIAGNOSIS — I10 Essential (primary) hypertension: Secondary | ICD-10-CM | POA: Diagnosis not present

## 2021-03-20 LAB — ECHOCARDIOGRAM COMPLETE
Area-P 1/2: 3.63 cm2
S' Lateral: 3.9 cm

## 2021-06-27 ENCOUNTER — Other Ambulatory Visit: Payer: Self-pay | Admitting: Cardiology

## 2021-06-27 DIAGNOSIS — I5042 Chronic combined systolic (congestive) and diastolic (congestive) heart failure: Secondary | ICD-10-CM

## 2021-07-09 ENCOUNTER — Other Ambulatory Visit: Payer: Self-pay | Admitting: Cardiology

## 2021-07-09 DIAGNOSIS — I5042 Chronic combined systolic (congestive) and diastolic (congestive) heart failure: Secondary | ICD-10-CM

## 2021-07-12 ENCOUNTER — Other Ambulatory Visit: Payer: Self-pay | Admitting: Cardiology

## 2021-07-12 DIAGNOSIS — I5042 Chronic combined systolic (congestive) and diastolic (congestive) heart failure: Secondary | ICD-10-CM

## 2021-07-20 ENCOUNTER — Emergency Department (HOSPITAL_COMMUNITY)
Admission: EM | Admit: 2021-07-20 | Discharge: 2021-07-20 | Disposition: A | Discharge status: Home / Self Care | Payer: POS | Attending: Emergency Medicine | Admitting: Emergency Medicine

## 2021-07-20 ENCOUNTER — Emergency Department (INDEPENDENT_AMBULATORY_CARE_PROVIDER_SITE_OTHER): Payer: POS

## 2021-07-20 ENCOUNTER — Other Ambulatory Visit: Payer: Self-pay | Admitting: Physician Assistant

## 2021-07-20 ENCOUNTER — Emergency Department (HOSPITAL_COMMUNITY): Payer: POS

## 2021-07-20 ENCOUNTER — Telehealth: Payer: Self-pay | Admitting: Cardiology

## 2021-07-20 ENCOUNTER — Other Ambulatory Visit: Payer: Self-pay

## 2021-07-20 DIAGNOSIS — R0602 Shortness of breath: Secondary | ICD-10-CM | POA: Insufficient documentation

## 2021-07-20 DIAGNOSIS — J4530 Mild persistent asthma, uncomplicated: Secondary | ICD-10-CM | POA: Insufficient documentation

## 2021-07-20 DIAGNOSIS — E119 Type 2 diabetes mellitus without complications: Secondary | ICD-10-CM | POA: Insufficient documentation

## 2021-07-20 DIAGNOSIS — I110 Hypertensive heart disease with heart failure: Secondary | ICD-10-CM | POA: Insufficient documentation

## 2021-07-20 DIAGNOSIS — Z79899 Other long term (current) drug therapy: Secondary | ICD-10-CM | POA: Insufficient documentation

## 2021-07-20 DIAGNOSIS — E039 Hypothyroidism, unspecified: Secondary | ICD-10-CM | POA: Diagnosis not present

## 2021-07-20 DIAGNOSIS — R519 Headache, unspecified: Secondary | ICD-10-CM | POA: Diagnosis not present

## 2021-07-20 DIAGNOSIS — R002 Palpitations: Secondary | ICD-10-CM

## 2021-07-20 DIAGNOSIS — R079 Chest pain, unspecified: Secondary | ICD-10-CM | POA: Insufficient documentation

## 2021-07-20 DIAGNOSIS — R200 Anesthesia of skin: Secondary | ICD-10-CM | POA: Insufficient documentation

## 2021-07-20 DIAGNOSIS — I428 Other cardiomyopathies: Secondary | ICD-10-CM | POA: Diagnosis not present

## 2021-07-20 DIAGNOSIS — R0789 Other chest pain: Secondary | ICD-10-CM

## 2021-07-20 DIAGNOSIS — I5042 Chronic combined systolic (congestive) and diastolic (congestive) heart failure: Secondary | ICD-10-CM | POA: Insufficient documentation

## 2021-07-20 LAB — BASIC METABOLIC PANEL
Anion gap: 9 (ref 5–15)
BUN: 22 mg/dL — ABNORMAL HIGH (ref 6–20)
CO2: 22 mmol/L (ref 22–32)
Calcium: 10 mg/dL (ref 8.9–10.3)
Chloride: 110 mmol/L (ref 98–111)
Creatinine, Ser: 0.94 mg/dL (ref 0.44–1.00)
GFR, Estimated: >60 mL/min (ref >60)
Glucose, Bld: 116 mg/dL — ABNORMAL HIGH (ref 70–99)
Potassium: 3.8 mmol/L (ref 3.5–5.1)
Sodium: 141 mmol/L (ref 135–145)

## 2021-07-20 LAB — CBC
HCT: 41.8 % (ref 36.0–46.0)
Hemoglobin: 13.7 g/dL (ref 12.0–15.0)
MCH: 28.4 pg (ref 26.0–34.0)
MCHC: 32.8 g/dL (ref 30.0–36.0)
MCV: 86.5 fL (ref 80.0–100.0)
Platelets: 232 10*3/uL (ref 150–400)
RBC: 4.83 MIL/uL (ref 3.87–5.11)
RDW: 14.4 % (ref 11.5–15.5)
WBC: 5.5 10*3/uL (ref 4.0–10.5)
nRBC: 0 % (ref 0.0–0.2)

## 2021-07-20 LAB — TROPONIN I (HIGH SENSITIVITY)
Troponin I (High Sensitivity): 4 ng/L (ref <18)
Troponin I (High Sensitivity): 6 ng/L (ref <18)

## 2021-07-20 LAB — BRAIN NATRIURETIC PEPTIDE: B Natriuretic Peptide: 41.2 pg/mL (ref 0.0–100.0)

## 2021-07-20 MED ORDER — ALBUTEROL SULFATE (2.5 MG/3ML) 0.083% IN NEBU
2.5000 mg | INHALATION_SOLUTION | Freq: Once | RESPIRATORY_TRACT | Status: AC
  Administered 2021-07-20: 2.5 mg via RESPIRATORY_TRACT
  Filled 2021-07-20: qty 3

## 2021-07-20 MED ORDER — ACETAMINOPHEN 500 MG PO TABS: 1000.0000 mg | ORAL_TABLET | Freq: Once | ORAL | Status: DC

## 2021-07-20 MED ORDER — METOPROLOL SUCCINATE ER 25 MG PO TB24: 37.5000 mg | ORAL_TABLET | Freq: Every day | ORAL | 3 refills | Status: DC

## 2021-07-20 MED ORDER — NITROGLYCERIN 0.4 MG SL SUBL
0.4000 mg | SUBLINGUAL_TABLET | Freq: Once | SUBLINGUAL | Status: AC
Start: 2021-07-20 — End: 2021-07-20
  Administered 2021-07-20: 0.4 mg via SUBLINGUAL
  Filled 2021-07-20: qty 1

## 2021-07-20 NOTE — H&P (Deleted)
Cardiology Admission History and Physical:   Patient ID: Lindsay Hayden MRN: [id number]; DOB: [birth year]   Admission date: 07/20/2021  PCP:  Kathyrn Lass, MD   [facility] Providers Cardiologist:  Fransico Him, MD        Chief Complaint:  Chest pain / palpitation  Patient Profile:   Lindsay Hayden is a 60 y.o. female with chronic combined CHF, nonischemic cardiomyopathy diagnosed in [place], HTN, HLD, DM2, LBBB, and OSA on CPAP who is being seen 07/20/2021 for the evaluation of chest pain and palpitation that started this morning.  History of Present Illness:   Lindsay Hayden is a 60 year old female was past medical history of chronic combined CHF, nonischemic cardiomyopathy diagnosed in [place], HTN, HLD, DM2, LBBB, and OSA on CPAP.  Patient says she was initially diagnosed in 2006 with nonischemic cardiomyopathy by Dr. Karie Soda in [place].  Cardiac catheterization performed at the time showed normal coronary arteries.  A few years later, she had another cardiac catheterization in [place] that showed similar finding.  After she moved to [place], she has been followed by Dr. Wynonia Lawman before switching to Dr. Radford Pax for Dr. Wynonia Lawman retired.  She does not have any prior history of EtOH abuse or illicit drug use.  She has no family history of cardiomyopathy.  Echocardiogram obtained in July 2020 showed EF 35 to 40%, moderate LVH, LBBB, normal RVSP.  She was unable to tolerate nighttime dose of hydralazine due to tendinitis.  Cardiac MRI obtained on 08/09/2019 showed a moderate LVE, diffuse hypokinesis, EF 43%, no sign of infarction/scar and no evidence of amyloidosis, mild MR, normal RV size and function.  She was unable to tolerate higher dose of Entresto due to problems sleeping, however she is able to tolerate moderate dose of Entresto.  She was last seen by Dr. Radford Pax in June 2022 at which time she was doing well.  She has been compliant with her medication regimen and  also CPAP.  Repeat echocardiogram obtained on 03/20/2021 showed EF improved to 45 to 50%, abnormal septal motion is significantly from LBBB, no regional wall motion abnormality, trivial MR.  When compared to the previous echo, ejection fraction is slightly improved.  Patient was in her usual state of health until around 7:15 AM this morning.  She was at work sitting down when she had a sudden onset of rapid fluttering sensation in her chest followed by burning sensation in the left chest.  Symptoms lasted about 1 to 2 minutes before going away however keep coming back.  She suspected she had a total of 5 episodes of symptom.  She denies significant shortness of breath or dizziness.  She sought medical attention at [facility], ED.  While hooked up to telemetry, she had another episode of rapid palpitation.  Telemetry so far only shows sinus rhythm, left bundle branch block with occasional PVCs.  Serial troponin was negative at 4-->6.  BNP normal.  Renal function, electrolyte and red blood cell count looks normal.  Chest x-ray normal.  EKG showed sinus rhythm with left bundle branch block, ST depression in the inferolateral leads unchanged when compared to previous EKG.   Past Medical History:  Diagnosis Date   Anemia    Arthritis    Asthma    Cardiomyopathy ([place])    EF 45% on echo 03/2021   Chronic combined systolic and diastolic CHF (congestive heart failure) ([place])    Diabetes mellitus without complication ([facility])    Hypercalcemia    Hyperlipidemia  Hypertension    Hypothyroid    LBBB (left bundle branch block)    LVH (left ventricular hypertrophy)    NICM (nonischemic cardiomyopathy) ([facility])    OSA (obstructive sleep apnea)    Overweight    Prediabetes    Shortness of breath    Sleep apnea     Past Surgical History:  Procedure Laterality Date   ACHILLES TENDON SURGERY     right   CESAREAN SECTION     x2   KNEE ARTHROSCOPY WITH MENISCAL REPAIR     right knee   OVARIAN CYST REMOVAL      right     Medications Prior to Admission: Prior to Admission medications   Medication Sig Start Date End Date Taking? Authorizing Provider  acetaminophen (TYLENOL) 650 MG CR tablet Take 650 mg by mouth every 8 (eight) hours as needed for pain.   Yes [provider]  albuterol (PROVENTIL HFA;VENTOLIN HFA) 108 (90 Base) MCG/ACT inhaler Inhale into the lungs every 6 (six) hours as needed for wheezing or shortness of breath.   Yes [provider]  atorvastatin (LIPITOR) 20 MG tablet Take 1 tablet (20 mg total) by mouth daily. 09/25/20  Yes Whitmire, Dawn W, FNP  Cholecalciferol (VITAMIN D) 125 MCG (5000 UT) CAPS Take by mouth 5 days per week. Patient taking differently: Take 1 capsule by mouth as directed. Take 1 capsule by mouth 5 days per week (Wed through Sun) 09/25/20  Yes Whitmire, [place], FNP  FARXIGA 10 MG TABS tablet TAKE 1 TABLET(10 MG) BY MOUTH DAILY 06/27/21  Yes Turner, Eber Hong, MD  fluticasone (FLOVENT HFA) 110 MCG/ACT inhaler Inhale 1 puff into the lungs daily as needed (congestion). 08/30/19  Yes [provider]  furosemide (LASIX) 20 MG tablet TAKE 1 TABLET FIVE DAYS A WEEK Patient taking differently: TAKE 1 TABLET FIVE DAYS A WEEK (Wed thru Sun) 07/12/21  Yes Turner, Eber Hong, MD  levothyroxine (SYNTHROID) 100 MCG tablet Take 100 mcg by mouth daily before breakfast.   Yes [provider]  meclizine (ANTIVERT) 25 MG tablet Take 1 tablet (25 mg total) by mouth 3 (three) times daily as needed for dizziness. 06/09/17  Yes Fredia Sorrow, MD  metoprolol succinate (TOPROL-XL) 25 MG 24 hr tablet Take 1 tablet (25 mg total) by mouth daily. 12/26/20  Yes Turner, Eber Hong, MD  naproxen sodium (ALEVE) 220 MG tablet Take 220 mg by mouth daily as needed (pain).   Yes [provider]  sacubitril-valsartan (ENTRESTO) 49-51 MG Take 1 tablet by mouth 2 (two) times daily. 12/26/20  Yes Turner, Eber Hong, MD  spironolactone (ALDACTONE) 25 MG tablet TAKE 1 TABLET  DAILY Patient taking differently: Take 25 mg by mouth daily. 07/09/21  Yes Turner, Eber Hong, MD  tiZANidine (ZANAFLEX) 4 MG tablet Take 4 mg by mouth every 6 (six) hours as needed for muscle spasms. 04/08/19  Yes [provider]     Allergies:   No Known Allergies  Social History:   Social History   Socioeconomic History   Marital status: Single    Spouse name: Not on file   Number of children: 2   Years of education: AD   Highest education level: Not on file  Occupational History   Occupation: Scientific laboratory technician: [employer]  Tobacco Use   Smoking status: Never   Smokeless tobacco: Never  Vaping Use   Vaping Use: Never used  Substance and Sexual Activity   Alcohol use: Yes  Alcohol/week: 1.0 standard drink    Types: 1 Glasses of wine per week    Comment: 1-2 times a month   Drug use: No   Sexual activity: Not on file  Other Topics Concern   Not on file  Social History Narrative   Patient is single, lives with boyfriend in a single story home, though does have a full basement that she rarely goes down the stairs to. No pets.   Social Determinants of Health   Financial Resource Strain: Not on file  Food Insecurity: Not on file  Transportation Needs: Not on file  Physical Activity: Not on file  Stress: Not on file  Social Connections: Not on file  Intimate Partner Violence: Not on file    Family History:   The patient's family history includes Alcohol abuse in her father; Cancer in her father; Diabetes in her mother; Hypertension in her father and mother; Obesity in her mother; Stroke in her mother.    ROS:  Please see the history of present illness.  All other ROS reviewed and negative.     Physical Exam/Data:   Vitals:   07/20/21 1100 07/20/21 1145 07/20/21 1245 07/20/21 1400  BP:  109/78 140/75 135/83  Pulse:  62 (!) 59 66  Resp:  20 15 19   Temp:      TempSrc:      SpO2:  97% 97% 96%  Weight: 90.3 kg     Height: 5\' 3"  (1.6 m)      No intake  or output data in the 24 hours ending 07/20/21 1442 Last 3 Weights 07/20/2021 02/19/2021 01/22/2021  Weight (lbs) 199 lb 203 lb 6.4 oz 196 lb  Weight (kg) 90.266 kg 92.262 kg 88.905 kg     Body mass index is 35.25 kg/m.  General:  Well nourished, well developed, in no acute distress HEENT: normal Neck: no JVD Vascular: No carotid bruits; Distal pulses 2+ bilaterally   Cardiac:  normal S1, S2; RRR; no murmur  Lungs:  clear to auscultation bilaterally, no wheezing, rhonchi or rales  Abd: soft, nontender, no hepatomegaly  Ext: no edema Musculoskeletal:  No deformities, BUE and BLE strength normal and equal Skin: warm and dry  Neuro:  CNs 2-12 intact, no focal abnormalities noted Psych:  Normal affect    EKG:  The ECG that was done and was personally reviewed and demonstrates sinus rhythm, left bundle branch block, ST depression in the inferolateral leads unchanged when compared to the previous EKG  Relevant CV Studies:  Echo 03/20/2021  1. Left ventricular ejection fraction, by estimation, is 45 to 50%. The  left ventricle has mildly decreased function that is likely related to  abnormal septal wall motion with septal/lateral wall dysynchrony from  LBBB. The left ventricle has no regional   wall motion abnormalities. Left ventricular diastolic function could not  be evaluated.   2. Right ventricular systolic function is normal. The right ventricular  size is normal.   3. The mitral valve is normal in structure. Trivial mitral valve  regurgitation. No evidence of mitral stenosis.   4. The aortic valve is normal in structure. Aortic valve regurgitation is  not visualized. No aortic stenosis is present.   5. The inferior vena cava is normal in size with greater than 50%  respiratory variability, suggesting right atrial pressure of 3 mmHg.   Laboratory Data:  High Sensitivity Troponin:   Recent Labs  Lab 07/20/21 0955 07/20/21 1311  TROPONINIHS 4 6  Chemistry Recent Labs   Lab 07/20/21 0955  NA 141  K 3.8  CL 110  CO2 22  GLUCOSE 116*  BUN 22*  CREATININE 0.94  CALCIUM 10.0  GFRNONAA >60  ANIONGAP 9    No results for input(s): PROT, ALBUMIN, AST, ALT, ALKPHOS, BILITOT in the last 168 hours. Lipids No results for input(s): CHOL, TRIG, HDL, LABVLDL, LDLCALC, CHOLHDL in the last 168 hours. Hematology Recent Labs  Lab 07/20/21 0955  WBC 5.5  RBC 4.83  HGB 13.7  HCT 41.8  MCV 86.5  MCH 28.4  MCHC 32.8  RDW 14.4  PLT 232   Thyroid No results for input(s): TSH, FREET4 in the last 168 hours. BNP Recent Labs  Lab 07/20/21 0955  BNP 41.2    DDimer No results for input(s): DDIMER in the last 168 hours.   Radiology/Studies:  DG Chest 2 View  Result Date: 07/20/2021 CLINICAL DATA:  Chest pain, shortness of breath EXAM: CHEST - 2 VIEW COMPARISON:  08/23/2019 FINDINGS: Transverse diameter of heart is slightly increased. There are no signs of pulmonary edema or focal pulmonary consolidation. There is no pleural effusion or pneumothorax. IMPRESSION: No active cardiopulmonary disease. Electronically Signed   By: Elmer Picker M.D.   On: 07/20/2021 10:06     Assessment and Plan:   Palpitation  -She describes at least 5 episode of palpitations since 7:15 AM this morning.  Each episode only lasted 1 to 2 minutes.  She denies any dizziness or shortness of breath.  Recent echocardiogram obtained in July 2022 showed mildly low EF however improved compared to previous echo.  -We will discuss with MD, potentially admit to the hospital for observation overnight.  If nothing is captured on telemetry, consider 2-week heart monitor upon discharge.  Burning chest pain: Patient has a sore spot over the left chest that is very tender to touch.  She described multiple episode of palpitations this morning followed by burning sensation right afterward.  After palpitation resolved, burning sensation resolved as well.  Patient has a history of nonischemic  cardiomyopathy and reportedly had at least 2 cardiac catheterization in [place] that showed normal coronary arteries.  I do not have the record. First cardiac catheterization was around 2006.  Second cardiac catheterization was only a few years after the first one, however patient does not remember the year when she had the last cardiac catheterization.  Chronic combined systolic and diastolic heart failure: Euvolemic on exam  Nonischemic cardiomyopathy: On appropriate heart failure medications include Farxiga, metoprolol succinate, Entresto, and spironolactone.  Ejection fraction improved to 45 to 50% on the most recent echocardiogram in July 2022.  Hypertension: She did not take her home medicines this morning, blood pressure on arrival was higher than her usual blood pressure in the 160s.  She says this is unusual for her as her blood pressure even before taking BP medication is lower than this.  We will restart home medication and observe her blood pressure here  Hyperlipidemia: Continue Lipitor  DM2  LBBB  Obstructive sleep apnea: Compliant with CPAP therapy   Risk Assessment/Risk Scores:    HEAR Score (for undifferentiated chest pain):  HEAR Score: 4       Severity of Illness: The appropriate patient status for this patient is OBSERVATION. Observation status is judged to be reasonable and necessary in order to provide the required intensity of service to ensure the patient's safety. The patient's presenting symptoms, physical exam findings, and initial radiographic and laboratory data in  the context of their medical condition is felt to place them at decreased risk for further clinical deterioration. Furthermore, it is anticipated that the patient will be medically stable for discharge from the hospital within 2 midnights of admission.    For questions or updates, please contact [place] Please consult [email or web address] for contact info under     Hilbert Corrigan, [place]   07/20/2021 2:42 PM

## 2021-07-20 NOTE — Consult Note (Signed)
Cardiology Consultation:   Patient ID: Lindsay Hayden MRN: [id number]; DOB: Nov 05, 1960  Admit date: 07/20/2021 Date of Consult: 07/20/2021  PCP:  Lindsay Lass, MD   [facility] Providers Cardiologist:  Lindsay Him, MD        Patient Profile:   Lindsay Hayden is a 60 y.o. female with a hx of  chronic combined CHF, nonischemic cardiomyopathy diagnosed in [place], HTN, HLD, DM2, LBBB, and OSA on CPAP  who is being seen 07/20/2021 for the evaluation of chest pain and palpitation at the request of Lindsay Hayden.  History of Present Illness:   Lindsay Hayden  is a 61 year old female was past medical history of chronic combined CHF, nonischemic cardiomyopathy diagnosed in [place], HTN, HLD, DM2, LBBB, and OSA on CPAP.  Patient says she was initially diagnosed in 2006 with nonischemic cardiomyopathy by Lindsay Hayden in [place].  Cardiac catheterization performed at the time showed normal coronary arteries.  A few years later, she had another cardiac catheterization in [place] that showed similar finding.  After she moved to [place], she has been followed by Lindsay Hayden before switching to Lindsay Hayden for Lindsay Hayden retired.  She does not have any prior history of EtOH abuse or illicit drug use.  She has no family history of cardiomyopathy.  Echocardiogram obtained in July 2020 showed EF 35 to 40%, moderate LVH, LBBB, normal RVSP.  She was unable to tolerate nighttime dose of hydralazine due to tendinitis.  Cardiac MRI obtained on 08/09/2019 showed a moderate LVE, diffuse hypokinesis, EF 43%, no sign of infarction/scar and no evidence of amyloidosis, mild MR, normal RV size and function.  She was unable to tolerate higher dose of Entresto due to problems sleeping, however she is able to tolerate moderate dose of Entresto.  She was last seen by Lindsay Hayden in June 2022 at which time she was doing well.  She has been compliant with her medication regimen and also CPAP.  Repeat  echocardiogram obtained on 03/20/2021 showed EF improved to 45 to 50%, abnormal septal motion is significantly from LBBB, no regional wall motion abnormality, trivial MR.  When compared to the previous echo, ejection fraction is slightly improved.   Patient was in her usual state of health until around 7:15 AM this morning.  She was at work sitting down when she had a sudden onset of rapid fluttering sensation in her chest followed by burning sensation in the left chest.  Symptoms lasted about 1 to 2 minutes before going away however keep coming back.  She suspected she had a total of 5 episodes of symptom.  She denies significant shortness of breath or dizziness.  She sought medical attention at [facility], ED.  While hooked up to telemetry, she had another episode of rapid palpitation.  Telemetry so far only shows sinus rhythm, left bundle branch block with occasional PVCs.  Serial troponin was negative at 4-->6.  BNP normal.  Renal function, electrolyte and red blood cell count looks normal.  Chest x-ray normal.  EKG showed sinus rhythm with left bundle branch block, ST depression in the inferolateral leads unchanged when compared to previous EKG.     Past Medical History:  Diagnosis Date   Anemia    Arthritis    Asthma    Cardiomyopathy ([place])    EF 45% on echo 03/2021   Chronic combined systolic and diastolic CHF (congestive heart failure) ([place])    Diabetes mellitus without complication (HCC)    Hypercalcemia  Hyperlipidemia    Hypertension    Hypothyroid    LBBB (left bundle branch block)    LVH (left ventricular hypertrophy)    NICM (nonischemic cardiomyopathy) (HCC)    OSA (obstructive sleep apnea)    Overweight    Prediabetes    Shortness of breath    Sleep apnea     Past Surgical History:  Procedure Laterality Date   ACHILLES TENDON SURGERY     right   CESAREAN SECTION     x2   KNEE ARTHROSCOPY WITH MENISCAL REPAIR     right knee   OVARIAN CYST REMOVAL     right      Home Medications:  Prior to Admission medications   Medication Sig Start Date End Date Taking? Authorizing Provider  acetaminophen (TYLENOL) 650 MG CR tablet Take 650 mg by mouth every 8 (eight) hours as needed for pain.   Yes [provider]  albuterol (PROVENTIL HFA;VENTOLIN HFA) 108 (90 Base) MCG/ACT inhaler Inhale into the lungs every 6 (six) hours as needed for wheezing or shortness of breath.   Yes [provider]  atorvastatin (LIPITOR) 20 MG tablet Take 1 tablet (20 mg total) by mouth daily. 09/25/20  Yes Lindsay Hayden, Dawn W, FNP  Cholecalciferol (VITAMIN D) 125 MCG (5000 UT) CAPS Take by mouth 5 days per week. Patient taking differently: Take 1 capsule by mouth as directed. Take 1 capsule by mouth 5 days per week (Wed through Sun) 09/25/20  Yes Lindsay Hayden, [place], FNP  FARXIGA 10 MG TABS tablet TAKE 1 TABLET(10 MG) BY MOUTH DAILY 06/27/21  Yes Lindsay Hayden, Lindsay Hong, MD  fluticasone (FLOVENT HFA) 110 MCG/ACT inhaler Inhale 1 puff into the lungs daily as needed (congestion). 08/30/19  Yes [provider]  furosemide (LASIX) 20 MG tablet TAKE 1 TABLET FIVE DAYS A WEEK Patient taking differently: TAKE 1 TABLET FIVE DAYS A WEEK (Wed thru Sun) 07/12/21  Yes Lindsay Hayden, Lindsay Hong, MD  levothyroxine (SYNTHROID) 100 MCG tablet Take 100 mcg by mouth daily before breakfast.   Yes [provider]  meclizine (ANTIVERT) 25 MG tablet Take 1 tablet (25 mg total) by mouth 3 (three) times daily as needed for dizziness. 06/09/17  Yes Fredia Sorrow, MD  metoprolol succinate (TOPROL-XL) 25 MG 24 hr tablet Take 1 tablet (25 mg total) by mouth daily. 12/26/20  Yes Lindsay Hayden, Lindsay Hong, MD  naproxen sodium (ALEVE) 220 MG tablet Take 220 mg by mouth daily as needed (pain).   Yes [provider]  sacubitril-valsartan (ENTRESTO) 49-51 MG Take 1 tablet by mouth 2 (two) times daily. 12/26/20  Yes Lindsay Hayden, Lindsay Hong, MD  spironolactone (ALDACTONE) 25 MG tablet TAKE 1 TABLET DAILY Patient taking  differently: Take 25 mg by mouth daily. 07/09/21  Yes Lindsay Hayden, Lindsay Hong, MD  tiZANidine (ZANAFLEX) 4 MG tablet Take 4 mg by mouth every 6 (six) hours as needed for muscle spasms. 04/08/19  Yes [provider]    Inpatient Medications: Scheduled Meds:  acetaminophen  1,000 mg Oral Once   Continuous Infusions:  PRN Meds:   Allergies:   No Known Allergies  Social History:   Social History   Socioeconomic History   Marital status: Single    Spouse name: Not on file   Number of children: 2   Years of education: AD   Highest education level: Not on file  Occupational History   Occupation: Scientific laboratory technician: [employer]  Tobacco Use   Smoking status: Never   Smokeless  tobacco: Never  Vaping Use   Vaping Use: Never used  Substance and Sexual Activity   Alcohol use: Yes    Alcohol/week: 1.0 standard drink    Types: 1 Glasses of wine per week    Comment: 1-2 times a month   Drug use: No   Sexual activity: Not on file  Other Topics Concern   Not on file  Social History Narrative   Patient is single, lives with boyfriend in a single story home, though does have a full basement that she rarely goes down the stairs to. No pets.   Social Determinants of Health   Financial Resource Strain: Not on file  Food Insecurity: Not on file  Transportation Needs: Not on file  Physical Activity: Not on file  Stress: Not on file  Social Connections: Not on file  Intimate Partner Violence: Not on file    Family History:    Family History  Problem Relation Age of Onset   Stroke Mother    Diabetes Mother    Hypertension Mother    Obesity Mother    Hypertension Father    Cancer Father    Alcohol abuse Father      ROS:  Please see the history of present illness.   All other ROS reviewed and negative.     Physical Exam/Data:   Vitals:   07/20/21 1145 07/20/21 1245 07/20/21 1400 07/20/21 1515  BP: 109/78 140/75 135/83 119/87  Pulse: 62 (!) 59 66 62  Resp: 20 15 19 18    Temp:      TempSrc:      SpO2: 97% 97% 96% 95%  Weight:      Height:       No intake or output data in the 24 hours ending 07/20/21 1605 Last 3 Weights 07/20/2021 02/19/2021 01/22/2021  Weight (lbs) 199 lb 203 lb 6.4 oz 196 lb  Weight (kg) 90.266 kg 92.262 kg 88.905 kg     Body mass index is 35.25 kg/m.  General:  Well nourished, well developed, in no acute distress HEENT: normal Neck: no JVD Vascular: No carotid bruits; Distal pulses 2+ bilaterally Cardiac:  normal S1, S2; RRR; no murmur  Lungs:  clear to auscultation bilaterally, no wheezing, rhonchi or rales  Abd: soft, nontender, no hepatomegaly  Ext: no edema Musculoskeletal:  No deformities, BUE and BLE strength normal and equal Skin: warm and dry  Neuro:  CNs 2-12 intact, no focal abnormalities noted Psych:  Normal affect   EKG:  The EKG was personally reviewed and demonstrates:  sinus rhythm, left bundle branch block, ST depression in the inferolateral leads unchanged when compared to the previous EKG Telemetry:  Telemetry was personally reviewed and demonstrates:  NSR with occasional PVCs  Relevant CV Studies:  Echo 03/20/2021  1. Left ventricular ejection fraction, by estimation, is 45 to 50%. The  left ventricle has mildly decreased function that is likely related to  abnormal septal wall motion with septal/lateral wall dysynchrony from  LBBB. The left ventricle has no regional   wall motion abnormalities. Left ventricular diastolic function could not  be evaluated.   2. Right ventricular systolic function is normal. The right ventricular  size is normal.   3. The mitral valve is normal in structure. Trivial mitral valve  regurgitation. No evidence of mitral stenosis.   4. The aortic valve is normal in structure. Aortic valve regurgitation is  not visualized. No aortic stenosis is present.   5. The inferior vena cava is normal  in size with greater than 50%  respiratory variability, suggesting right atrial pressure  of 3 mmHg.     Laboratory Data:  High Sensitivity Troponin:   Recent Labs  Lab 07/20/21 0955 07/20/21 1311  TROPONINIHS 4 6     Chemistry Recent Labs  Lab 07/20/21 0955  NA 141  K 3.8  CL 110  CO2 22  GLUCOSE 116*  BUN 22*  CREATININE 0.94  CALCIUM 10.0  GFRNONAA >60  ANIONGAP 9    No results for input(s): PROT, ALBUMIN, AST, ALT, ALKPHOS, BILITOT in the last 168 hours. Lipids No results for input(s): CHOL, TRIG, HDL, LABVLDL, LDLCALC, CHOLHDL in the last 168 hours.  Hematology Recent Labs  Lab 07/20/21 0955  WBC 5.5  RBC 4.83  HGB 13.7  HCT 41.8  MCV 86.5  MCH 28.4  MCHC 32.8  RDW 14.4  PLT 232   Thyroid No results for input(s): TSH, FREET4 in the last 168 hours.  BNP Recent Labs  Lab 07/20/21 0955  BNP 41.2    DDimer No results for input(s): DDIMER in the last 168 hours.   Radiology/Studies:  DG Chest 2 View  Result Date: 07/20/2021 CLINICAL DATA:  Chest pain, shortness of breath EXAM: CHEST - 2 VIEW COMPARISON:  08/23/2019 FINDINGS: Transverse diameter of heart is slightly increased. There are no signs of pulmonary edema or focal pulmonary consolidation. There is no pleural effusion or pneumothorax. IMPRESSION: No active cardiopulmonary disease. Electronically Signed   By: Elmer Picker M.D.   On: 07/20/2021 10:06     Assessment and Plan:   Palpitation       -She describes at least 5 episode of palpitations since 7:15 AM this morning.  Each episode only lasted 1 to 2 minutes.  She denies any dizziness or shortness of breath.  Recent echocardiogram obtained in July 2022 showed mildly low EF however improved compared to previous echo.            -discussed with MD, ok to discharge from ED, will increase home metoprolol succinate to 37.5mg  daily (1.5 tablets of the 25mg  dosage). May go up to 50mg  in the future if her BP able to tolerates it. Office will mail the patient a 2 week monitor and arrange 5-6 weeks follow up to review heart monitor.   -  Patient will need work note to stay off work over the weekend (she usually works on the weekend) and return to work next Monday.  Burning chest pain: Patient has a sore spot over the left chest that is very tender to touch.  She described multiple episode of palpitations this morning followed by burning sensation right afterward.  After palpitation resolved, burning sensation resolved as well.  Patient has a history of nonischemic cardiomyopathy and reportedly had at least 2 cardiac catheterization in [place] that showed normal coronary arteries.  I do not have the record. First cardiac catheterization was around 2006.  Second cardiac catheterization was only a few years after the first one, however patient does not remember the year when she had the last cardiac catheterization.   - chest pain quite atypical, normal serial troponin  Chronic combined systolic and diastolic heart failure: Euvolemic on exam   Nonischemic cardiomyopathy: On appropriate heart failure medications include Farxiga, metoprolol succinate, Entresto, and spironolactone.  Ejection fraction improved to 45 to 50% on the most recent echocardiogram in July 2022.   Hypertension: She did not take her home medicines this morning, blood pressure on arrival was higher  than her usual blood pressure in the 160s.  She says this is unusual for her as her blood pressure even before taking BP medication is lower than this.     Hyperlipidemia: Continue Lipitor   DM2   LBBB   Obstructive sleep apnea: Compliant with CPAP therapy   Risk Assessment/Risk Scores:     HEAR Score (for undifferentiated chest pain):  HEAR Score: 4      [facility] will sign off.   Medication Recommendations:  increase metoprolol succinate to 37.5 mg daily.  Other recommendations (labs, testing, etc):  outpatient 2 week Zio monitor Follow up as an outpatient:  5-6 weeks with Lindsay Hayden or APP  For questions or updates, please contact [facility] Please consult [email or web address] for contact info under    Hilbert Corrigan, [place]  07/20/2021 4:05 PM

## 2021-07-20 NOTE — ED Provider Notes (Signed)
[facility] EMERGENCY DEPARTMENT Provider Note   CSN: [id number] Arrival date & time: 07/20/21  1025     History Chief Complaint  Patient presents with   Chest Pain   Shortness of Breath   Hypertension    Lindsay Hayden is a 60 y.o. female. Hx of CHF and nonischemic cardiomyopathy who presents with burning left sided chest pain that radiates to her neck with associated palpitations and  shortness of breath that began this morning while she was sitting down at work. Pain is not exacerbated with exertion of movement, not relieved with rest. She has not taken anything to alleviate the pain. Has noticed   The history is provided by the patient.  Chest Pain Associated symptoms: headache, numbness and shortness of breath   Shortness of Breath Associated symptoms: chest pain and headaches   Hypertension Associated symptoms include chest pain, headaches and shortness of breath.   HPI: A 60 year old patient with a history of treated diabetes, hypertension, hypercholesterolemia and obesity presents for evaluation of chest pain. Initial onset of pain was less than one hour ago. The patient's chest pain is not worse with exertion. The patient's chest pain is middle- or left-sided, is not well-localized, is not described as heaviness/pressure/tightness, is not sharp and does not radiate to the arms/jaw/neck. The patient does not complain of nausea and denies diaphoresis. The patient has no history of stroke, has no history of peripheral artery disease, has not smoked in the past 90 days and has no relevant family history of coronary artery disease (first degree relative at less than age 35).   Past Medical History:  Diagnosis Date   Anemia    Arthritis    Asthma    Cardiomyopathy ([place])    EF 45% on echo 03/2021   Chronic combined systolic and diastolic CHF (congestive heart failure) ([place])    Diabetes mellitus without complication ([place])    Hypercalcemia    Hyperlipidemia     Hypertension    Hypothyroid    LBBB (left bundle branch block)    LVH (left ventricular hypertrophy)    NICM (nonischemic cardiomyopathy) (HCC)    OSA (obstructive sleep apnea)    Overweight    Prediabetes    Shortness of breath    Sleep apnea     Patient Active Problem List   Diagnosis Date Noted   Chronic combined systolic and diastolic CHF (congestive heart failure) ([place]) 11/08/2020   Hypercholesterolemia 09/11/2020   Hypothyroidism 09/11/2020   Mild persistent asthma, uncomplicated 85/27/7824   Osteoarthritis of knee 09/11/2020   Sciatica 09/11/2020   Hyperglycemia 09/11/2020   Hyperlipidemia associated with type 2 diabetes mellitus ([place]) 08/29/2020   Vitamin D deficiency 08/29/2020   Combined systolic and diastolic congestive heart failure ([place]) 08/09/2020   Diabetes mellitus ([place]) 08/09/2020   DCM (dilated cardiomyopathy) ([place]) 02/15/2019   LBBB (left bundle branch block) 02/15/2019   Hypertension 02/15/2019   Obstructive sleep apnea syndrome 02/15/2019   Class 2 severe obesity with serious comorbidity and body mass index (BMI) of 35.0 to 35.9 in adult [facility]) 02/15/2019    Past Surgical History:  Procedure Laterality Date   ACHILLES TENDON SURGERY     right   CESAREAN SECTION     x2   KNEE ARTHROSCOPY WITH MENISCAL REPAIR     right knee   OVARIAN CYST REMOVAL     right     OB History     Gravida  2   Para  Term      Preterm      AB      Living         SAB      IAB      Ectopic      Multiple      Live Births              Family History  Problem Relation Age of Onset   Stroke Mother    Diabetes Mother    Hypertension Mother    Obesity Mother    Hypertension Father    Cancer Father    Alcohol abuse Father     Social History   Tobacco Use   Smoking status: Never   Smokeless tobacco: Never  Vaping Use   Vaping Use: Never used  Substance Use Topics   Alcohol use: Yes    Alcohol/week: 1.0 standard drink    Types: 1  Glasses of wine per week    Comment: 1-2 times a month   Drug use: No    Home Medications Prior to Admission medications   Medication Sig Start Date End Date Taking? Authorizing Provider  acetaminophen (TYLENOL) 650 MG CR tablet Take 650 mg by mouth every 8 (eight) hours as needed for pain.   Yes [provider]  albuterol (PROVENTIL HFA;VENTOLIN HFA) 108 (90 Base) MCG/ACT inhaler Inhale into the lungs every 6 (six) hours as needed for wheezing or shortness of breath.   Yes [provider]  atorvastatin (LIPITOR) 20 MG tablet Take 1 tablet (20 mg total) by mouth daily. 09/25/20  Yes Whitmire, Dawn W, FNP  Cholecalciferol (VITAMIN D) 125 MCG (5000 UT) CAPS Take by mouth 5 days per week. Patient taking differently: Take 1 capsule by mouth as directed. Take 1 capsule by mouth 5 days per week (Wed through Sun) 09/25/20  Yes Whitmire, [place], FNP  FARXIGA 10 MG TABS tablet TAKE 1 TABLET(10 MG) BY MOUTH DAILY 06/27/21  Yes Turner, Eber Hong, MD  fluticasone (FLOVENT HFA) 110 MCG/ACT inhaler Inhale 1 puff into the lungs daily as needed (congestion). 08/30/19  Yes [provider]  furosemide (LASIX) 20 MG tablet TAKE 1 TABLET FIVE DAYS A WEEK Patient taking differently: TAKE 1 TABLET FIVE DAYS A WEEK (Wed thru Sun) 07/12/21  Yes Turner, Eber Hong, MD  levothyroxine (SYNTHROID) 100 MCG tablet Take 100 mcg by mouth daily before breakfast.   Yes [provider]  meclizine (ANTIVERT) 25 MG tablet Take 1 tablet (25 mg total) by mouth 3 (three) times daily as needed for dizziness. 06/09/17  Yes Fredia Sorrow, MD  naproxen sodium (ALEVE) 220 MG tablet Take 220 mg by mouth daily as needed (pain).   Yes [provider]  sacubitril-valsartan (ENTRESTO) 49-51 MG Take 1 tablet by mouth 2 (two) times daily. 12/26/20  Yes Turner, Eber Hong, MD  spironolactone (ALDACTONE) 25 MG tablet TAKE 1 TABLET DAILY Patient taking differently: Take 25 mg by mouth daily. 07/09/21  Yes Turner,  Eber Hong, MD  tiZANidine (ZANAFLEX) 4 MG tablet Take 4 mg by mouth every 6 (six) hours as needed for muscle spasms. 04/08/19  Yes [provider]  metoprolol succinate (TOPROL-XL) 25 MG 24 hr tablet Take 1.5 tablets (37.5 mg total) by mouth daily. 07/20/21   Almyra Deforest, PA    Allergies    Patient has no known allergies.  Review of Systems   Review of Systems  Constitutional: Negative.   HENT: Negative.    Respiratory:  Positive for shortness of breath.   Cardiovascular:  Positive for chest pain.  Gastrointestinal: Negative.   Musculoskeletal: Negative.   Skin: Negative.   Neurological:  Positive for numbness and headaches.  Psychiatric/Behavioral: Negative.     Physical Exam Updated Vital Signs BP (!) 104/51   Pulse 67   Temp 98 F (36.7 C) (Oral)   Resp 17   Ht 5\' 3"  (1.6 m)   Wt 90.3 kg   LMP 07/16/2017   SpO2 96%   BMI 35.25 kg/m   Physical Exam Constitutional:      General: She is not in acute distress.    Appearance: She is well-developed and normal weight. She is not ill-appearing, toxic-appearing or diaphoretic.  HENT:     Head: Normocephalic and atraumatic.  Eyes:     Extraocular Movements: Extraocular movements intact.     Pupils: Pupils are equal, round, and reactive to light.  Neck:     Vascular: No JVD.  Cardiovascular:     Rate and Rhythm: Normal rate and regular rhythm.     Heart sounds: Normal heart sounds.  Pulmonary:     Effort: Pulmonary effort is normal.     Breath sounds: Normal breath sounds.  Musculoskeletal:     Cervical back: Normal range of motion and neck supple.  Neurological:     General: No focal deficit present.     Mental Status: She is alert.     Motor: No weakness.     Comments: Subjective numbness in left arm. Gross sensation intact. No obvious deficits in strength or coordination. No pronator drift noted on exam.  Cranial nerve testing intact. No facial droop. Speech intact.   Psychiatric:        Mood and Affect: Mood  normal.        Behavior: Behavior normal.    ED Results / Procedures / Treatments   Labs (all labs ordered are listed, but only abnormal results are displayed) Labs Reviewed  BASIC METABOLIC PANEL - Abnormal; Notable for the following components:      Result Value   Glucose, Bld 116 (*)    BUN 22 (*)    All other components within normal limits  CBC  BRAIN NATRIURETIC PEPTIDE  TROPONIN I (HIGH SENSITIVITY)  TROPONIN I (HIGH SENSITIVITY)    EKG EKG Interpretation  Date/Time:  Friday July 20 2021 09:49:54 EDT Ventricular Rate:  74 PR Interval:  190 QRS Duration: 164 QT Interval:  472 QTC Calculation: 523 R Axis:   60 Text Interpretation: Sinus rhythm with occasional Premature ventricular complexes Left bundle branch block Abnormal ECG no prior for comparison Confirmed by Madalyn Rob [phone]) on 07/20/2021 10:42:50 AM  Radiology No results found.  Procedures Procedures   Medications Ordered in ED Medications  nitroGLYCERIN (NITROSTAT) SL tablet 0.4 mg (0.4 mg Sublingual Given 07/20/21 1136)  albuterol (PROVENTIL) (2.5 MG/3ML) 0.083% nebulizer solution 2.5 mg (2.5 mg Inhalation Given 07/20/21 1136)    ED Course  I have reviewed the triage vital signs and the nursing notes.  Pertinent labs & imaging results that were available during my care of the patient were reviewed by me and considered in my medical decision making (see chart for details).    MDM Rules/Calculators/A&P HEAR Score: 4                         73 yof with hx NICM presenting with complaints of chest pain. EKG largely unchanged from prior. Troponin was  reassuring and CXR showed no active cardiopulmonary disease. Patient was given nitro for chest pain and albuterol for wheeze heard on lung ascultation.  After albuterol and nitro, patient reported improvement in her chest tightness but worsened headache and new numbness in her left arm and hand. Headache likely side effect of nitro administration.  Neuro exam grossly normal aside from subjective numbness in left upper extremity. With ongoing chest discomfort, improved with nitro administration as well as new numbness in left arm, question whether this is an anginal equivalent. She is a high risk patient with her history of CHF and NICM. Discussed with cardiology with plans to evaluate pt. Care assumed by oncoming provider.  Final Clinical Impression(s) / ED Diagnoses Final diagnoses:  Chest pain, unspecified type    Rx / DC Orders ED Discharge Orders          Ordered    metoprolol succinate (TOPROL-XL) 25 MG 24 hr tablet  Daily        07/20/21 1616             Delene Ruffini, MD 07/23/21 0011    Lucrezia Starch, MD 07/24/21 1347

## 2021-07-20 NOTE — Progress Notes (Unsigned)
Enrolled patient for a 14 day Zio XT monitor to be mailed to patients home  Dr Turner to read 

## 2021-07-20 NOTE — ED Triage Notes (Signed)
Pt. Stated, I started having chest pain with SOB and my BP was high. This started around 0700

## 2021-07-20 NOTE — Telephone Encounter (Signed)
Patient has been having symptoms since 7:15 am this morning. Those include HTN, SOB, palpitations, swelling, and continuous chest pain. Patient already has someone currently driving her to the ER. Advised patient to continue to the ER as planned for evaluation. Will forward to Dr. Radford Pax to follow.

## 2021-07-20 NOTE — ED Provider Notes (Signed)
Emergency Medicine Provider Triage Evaluation Note  Lindsay Hayden , a 60 y.o. female  was evaluated in triage.  Patient has a history of cardiomyopathy and CHF.  Pt complains of chest pain shortness of breath since 0700 today.  Patient reports the pain radiates back to the left side of her neck but feels more "burning "and she feels palpitations.  She reports she feels a little bit congested this morning.  No worsening dyspnea on exertion.  Denies any leg swelling, cough, sore throat.  Review of Systems  Positive: Chest pain, shortness of breath, palpitations Negative: Leg swelling, cough, sore throat sinus, lightheadedness, numbness, tingling  Physical Exam  LMP 07/16/2017  Gen:   Awake, no distress   Resp:  Normal effort  MSK:   Moves extremities without difficulty  Other:  Patient speaking in full sentences.  Lungs clear to auscultation bilaterally.  Regular rate and rhythm.  Medical Decision Making  Medically screening exam initiated at 9:46 AM.  Appropriate orders placed.  Lindsay Hayden was informed that the remainder of the evaluation will be completed by another provider, this initial triage assessment does not replace that evaluation, and the importance of remaining in the ED until their evaluation is complete.  Chest pain order set placed.   Lindsay Puller, PA-C 07/20/21 4461    Lindsay Bo, MD 07/21/21 1736

## 2021-07-20 NOTE — Discharge Instructions (Addendum)
You have been seen and discharged from the emergency department.  Follow-up with cardiology for reevaluation and further care. Take new medication as directed, take home medications as prescribed. If you have any worsening symptoms or further concerns for your health please return to an emergency department for further evaluation.

## 2021-07-20 NOTE — ED Provider Notes (Signed)
Patient signed out to me by previous provider. Please refer to their note for full HPI.  Briefly this is a 60 year old female who presented for chest pain.  Cardiac work-up has been reassuring but she has higher rest, cardiology is consulting and will give recommendations. Physical Exam  BP (!) 104/51   Pulse 67   Temp 98 F (36.7 C) (Oral)   Resp 17   Ht 5\' 3"  (1.6 m)   Wt 90.3 kg   LMP 07/16/2017   SpO2 96%   BMI 35.25 kg/m   Physical Exam Vitals and nursing note reviewed.  Constitutional:      Appearance: Normal appearance.  HENT:     Head: Normocephalic.     Mouth/Throat:     Mouth: Mucous membranes are moist.  Cardiovascular:     Rate and Rhythm: Normal rate.  Pulmonary:     Effort: Pulmonary effort is normal. No respiratory distress.  Abdominal:     Palpations: Abdomen is soft.     Tenderness: There is no abdominal tenderness.  Skin:    General: Skin is warm.  Neurological:     Mental Status: She is alert and oriented to person, place, and time. Mental status is at baseline.  Psychiatric:        Mood and Affect: Mood normal.    ED Course/Procedures     Procedures  MDM   Cardiology has seen the patient.  They recommend increasing her metoprolol prescription, acknowledge her current risk factors and will follow her closely as an outpatient with heart monitoring scheduled.  Otherwise believe she can be followed as an outpatient and the patient is comfortable and prefers this.  Vitals have remained stable, she offers no new concerns or complaints.  Patient at this time appears safe and stable for discharge and will be treated as an outpatient.  Discharge plan and strict return to ED precautions discussed, patient verbalizes understanding and agreement.       Lindsay Gibbs, DO 07/20/21 1713

## 2021-07-20 NOTE — Telephone Encounter (Signed)
Pt is scheduled to see Dr. Radford Pax for 08/27/21 at 11 am.  Staff message sent to monitor techs to arrange getting the pts zio monitor to him.

## 2021-07-20 NOTE — Telephone Encounter (Signed)
-----   Message from [place], [place] sent at 07/20/2021  4:17 PM EDT ----- Regarding: 2 week nonlive zio and 5-6 weeks follow up Dr. [name] patient, seen in the ED for palpitation and atypical chest pain. Recommend 2 week Zio (non live) monitor and 5-6 weeks follow up with Dr. Radford Pax or APP.  Please contact the patient to arrange.   Thank you [name]

## 2021-07-20 NOTE — Telephone Encounter (Signed)
Patient c/o Palpitations:  High priority if patient c/o lightheadedness, shortness of breath, or chest pain  How long have you had palpitations/irregular HR/ Afib? Are you having the symptoms now? SINCE 7:15AM THIS MORNING  Are you currently experiencing lightheadedness, SOB or CP? SOME SOB, LITTLE CP NO LIGHTHEADEDNESS  Do you have a history of afib (atrial fibrillation) or irregular heart rhythm? YES  Have you checked your BP or HR? (document readings if available): 174/108  Are you experiencing any other symptoms? BP Pt c/o BP issue: STAT if pt c/o blurred vision, one-sided weakness or slurred speech  1. What are your last 5 BP readings? 174/108  2. Are you having any other symptoms (ex. Dizziness, headache, blurred vision, passed out)? NO TO ALL  3. What is your BP issue? BP HIGH

## 2021-07-25 DIAGNOSIS — R002 Palpitations: Secondary | ICD-10-CM

## 2021-08-27 ENCOUNTER — Ambulatory Visit: Payer: POS | Admitting: Cardiology

## 2021-11-19 ENCOUNTER — Ambulatory Visit: Payer: POS | Admitting: Cardiology

## 2021-11-26 ENCOUNTER — Other Ambulatory Visit: Payer: Self-pay | Admitting: Family Medicine

## 2021-11-26 DIAGNOSIS — Z1231 Encounter for screening mammogram for malignant neoplasm of breast: Secondary | ICD-10-CM

## 2021-12-22 ENCOUNTER — Other Ambulatory Visit: Payer: Self-pay | Admitting: Cardiology

## 2021-12-22 DIAGNOSIS — I5042 Chronic combined systolic (congestive) and diastolic (congestive) heart failure: Secondary | ICD-10-CM

## 2022-01-01 NOTE — Progress Notes (Signed)
?Cardiology Office Note:   ? ?Date:  01/02/2022  ? ?ID:  Lindsay Hayden, DOB 1961-05-08, MRN [id number] ? ?PCP:  Kathyrn Lass, MD  ?[facility] Cardiologist:  Fransico Him, MD  ?[facility] Electrophysiologist:  None  ? ?Chief Complaint: 6 months follow up  ? ?History of Present Illness:   ? ?Lindsay Hayden is a 61 y.o. female with a hx of chronic combined CHF, nonischemic cardiomyopathy diagnosed in [place], HTN, HLD, DM2, chronic LBBB, and OSA on CPAP seen for follow up.  ? ?She was initially diagnosed in 2006 with nonischemic cardiomyopathy by Dr. Karie Soda in [place].  Cardiac catheterization performed at the time showed normal coronary arteries.  A few years later, she had another cardiac catheterization in [place] that showed similar finding.  After she moved to [place], she has been followed by Dr. Wynonia Lawman before switching to Dr. Radford Pax for Dr. Wynonia Lawman retired. ? ?Echocardiogram obtained in July 2020 showed EF 35 to 40%, moderate LVH, LBBB, normal RVSP.  She was unable to tolerate nighttime dose of hydralazine due to tendinitis.  Cardiac MRI obtained on 08/09/2019 showed a moderate LVE, diffuse hypokinesis, EF 43%, no sign of infarction/scar and no evidence of amyloidosis, mild MR, normal RV size and function.  She was unable to tolerate higher dose of Entresto due to problems sleeping, however she is able to tolerate moderate dose of Entresto.  Repeat echocardiogram obtained on 03/20/2021 showed EF improved to 45 to 50%, abnormal septal motion is significantly from LBBB, no regional wall motion abnormality, trivial MR.   ? ?Last seen when came to ER July 20, 2021 for atypical chest pain and palpitation.  Increase metoprolol succinate.  Follow-up monitor without significant abnormality. ? ?Seen today for follow up.  No regular exercise but does household stuff without chest pain or shortness of breath.  She has chronic knee issue limiting her activity.  Denies orthopnea, PND,  syncope, lower extremity edema or melena.  Compliant with CPAP.  Uses low-sodium diet. ? ?Past Medical History:  ?Diagnosis Date  ? Anemia   ? Arthritis   ? Asthma   ? Cardiomyopathy ([place])   ? EF 45% on echo 03/2021  ? Chronic combined systolic and diastolic CHF (congestive heart failure) ([place])   ? Diabetes mellitus without complication ([place])   ? Hypercalcemia   ? Hyperlipidemia   ? Hypertension   ? Hypothyroid   ? LBBB (left bundle branch block)   ? LVH (left ventricular hypertrophy)   ? NICM (nonischemic cardiomyopathy) ([place])   ? OSA (obstructive sleep apnea)   ? Overweight   ? Prediabetes   ? Shortness of breath   ? Sleep apnea   ? ? ?Past Surgical History:  ?Procedure Laterality Date  ? ACHILLES TENDON SURGERY    ? right  ? CESAREAN SECTION    ? x2  ? KNEE ARTHROSCOPY WITH MENISCAL REPAIR    ? right knee  ? OVARIAN CYST REMOVAL    ? right  ? ? ?Current Medications: ?Current Meds  ?Medication Sig  ? acetaminophen (TYLENOL) 650 MG CR tablet Take 650 mg by mouth every 8 (eight) hours as needed for pain.  ? albuterol (PROVENTIL HFA;VENTOLIN HFA) 108 (90 Base) MCG/ACT inhaler Inhale into the lungs every 6 (six) hours as needed for wheezing or shortness of breath.  ? atorvastatin (LIPITOR) 20 MG tablet Take 1 tablet (20 mg total) by mouth daily.  ? Cholecalciferol (VITAMIN D) 125 MCG (5000 UT) CAPS Take by mouth 5  days per week.  ? FARXIGA 10 MG TABS tablet TAKE 1 TABLET(10 MG) BY MOUTH DAILY  ? fluticasone (FLOVENT HFA) 110 MCG/ACT inhaler Inhale 1 puff into the lungs daily as needed (congestion).  ? furosemide (LASIX) 20 MG tablet TAKE 1 TABLET FIVE DAYS A WEEK  ? levothyroxine (SYNTHROID) 100 MCG tablet Take 100 mcg by mouth daily before breakfast.  ? meclizine (ANTIVERT) 25 MG tablet Take 1 tablet (25 mg total) by mouth 3 (three) times daily as needed for dizziness.  ? naproxen sodium (ALEVE) 220 MG tablet Take 220 mg by mouth daily as needed (pain).  ? QVAR REDIHALER 80 MCG/ACT inhaler 1 puff 2 (two) times daily.   ? spironolactone (ALDACTONE) 25 MG tablet TAKE 1 TABLET DAILY  ? [DISCONTINUED] metoprolol succinate (TOPROL-XL) 25 MG 24 hr tablet Take 1.5 tablets (37.5 mg total) by mouth daily.  ? [DISCONTINUED] sacubitril-valsartan (ENTRESTO) 49-51 MG Take 1 tablet by mouth 2 (two) times daily.  ?  ? ?Allergies:   Patient has no known allergies.  ? ?Social History  ? ?Socioeconomic History  ? Marital status: Single  ?  Spouse name: Not on file  ? Number of children: 2  ? Years of education: AD  ? Highest education level: Not on file  ?Occupational History  ? Occupation: Special educational needs teacher  ?  Employer: [employer]  ?Tobacco Use  ? Smoking status: Never  ? Smokeless tobacco: Never  ?Vaping Use  ? Vaping Use: Never used  ?Substance and Sexual Activity  ? Alcohol use: Yes  ?  Alcohol/week: 1.0 standard drink  ?  Types: 1 Glasses of wine per week  ?  Comment: 1-2 times a month  ? Drug use: No  ? Sexual activity: Not on file  ?Other Topics Concern  ? Not on file  ?Social History Narrative  ? Patient is single, lives with boyfriend in a single story home, though does have a full basement that she rarely goes down the stairs to. No pets.  ? ?Social Determinants of Health  ? ?Financial Resource Strain: Not on file  ?Food Insecurity: Not on file  ?Transportation Needs: Not on file  ?Physical Activity: Not on file  ?Stress: Not on file  ?Social Connections: Not on file  ?  ? ?Family History: ?The patient's family history includes Alcohol abuse in her father; Cancer in her father; Diabetes in her mother; Hypertension in her father and mother; Obesity in her mother; Stroke in her mother.   ? ?ROS:   ?Please see the history of present illness.    ?All other systems reviewed and are negative.  ? ?EKGs/Labs/Other Studies Reviewed:   ? ?The following studies were reviewed today: ? ?Monitor 08/13/2021 ?Predominant rhythm was normal sinus rhythm average heart rate of 70 bpm and ranged from 45 to 128 bpm ?SVT with fastest run at a maximum rate of 158 bpm and  longest run lasting 7 beats. ?Occasional PACs and rare PVCs ?  ?  ?Patch Wear Time:  11 days and 9 hours (2022-11-09T20:04:48-0500 to 2022-11-21T05:19:05-499) ?  ?Patient had a min HR of 45 bpm, max HR of 158 bpm, and avg HR of 70 bpm. Predominant underlying rhythm was Sinus Rhythm. Bundle Branch Block/IVCD was present. 3 Supraventricular Tachycardia runs occurred, the run with the fastest interval lasting 4 beats ? with a max rate of 158 bpm, the longest lasting 7 beats with an avg rate of 133 bpm. Isolated SVEs were rare (<1.0%), SVE Couplets were rare (<1.0%), and no SVE  Triplets were present. Isolated VEs were rare (<1.0%), and no VE Couplets or VE Triplets  ?were present. ? ?Echo 03/20/2021 ?1. Left ventricular ejection fraction, by estimation, is 45 to 50%. The  ?left ventricle has mildly decreased function that is likely related to  ?abnormal septal wall motion with septal/lateral wall dysynchrony from  ?LBBB. The left ventricle has no regional  ? wall motion abnormalities. Left ventricular diastolic function could not  ?be evaluated.  ? 2. Right ventricular systolic function is normal. The right ventricular  ?size is normal.  ? 3. The mitral valve is normal in structure. Trivial mitral valve  ?regurgitation. No evidence of mitral stenosis.  ? 4. The aortic valve is normal in structure. Aortic valve regurgitation is  ?not visualized. No aortic stenosis is present.  ? 5. The inferior vena cava is normal in size with greater than 50%  ?respiratory variability, suggesting right atrial pressure of 3 mmHg.  ?  ?cMRI 08/09/2019 ?IMPRESSION: ?1. Moderate LVE with diffuse hypokinesis and abnormal septal motion ?EF 43% ?  ?2. No infarct/scar and no evidence of amyloidosis on delayed ?inversion recovery sequences with gadolinium ?  ?3.  Mild MR ?  ?4.  Mild LAE ?  ?5.  Normal RV size and function ? ?EKG:  EKG is not ordered today.   ? ?Recent Labs: ?07/20/2021: B Natriuretic Peptide 41.2; BUN 22; Creatinine, Ser 0.94;  Hemoglobin 13.7; Platelets 232; Potassium 3.8; Sodium 141  ?Recent Lipid Panel ?   ?Component Value Date/Time  ? CHOL 167 08/29/2020 0941  ? TRIG 62 08/29/2020 0941  ? HDL 45 08/29/2020 0941  ? CHOLHDL 3.2

## 2022-01-02 ENCOUNTER — Ambulatory Visit (INDEPENDENT_AMBULATORY_CARE_PROVIDER_SITE_OTHER): Payer: POS | Admitting: Physician Assistant

## 2022-01-02 ENCOUNTER — Encounter: Payer: Self-pay | Admitting: Physician Assistant

## 2022-01-02 VITALS — BP 118/72 | HR 59 | Ht 63.0 in | Wt 205.4 lb

## 2022-01-02 DIAGNOSIS — G4733 Obstructive sleep apnea (adult) (pediatric): Secondary | ICD-10-CM | POA: Diagnosis not present

## 2022-01-02 DIAGNOSIS — I10 Essential (primary) hypertension: Secondary | ICD-10-CM | POA: Diagnosis not present

## 2022-01-02 DIAGNOSIS — E7849 Other hyperlipidemia: Secondary | ICD-10-CM

## 2022-01-02 DIAGNOSIS — Z9989 Dependence on other enabling machines and devices: Secondary | ICD-10-CM

## 2022-01-02 DIAGNOSIS — I5042 Chronic combined systolic (congestive) and diastolic (congestive) heart failure: Secondary | ICD-10-CM

## 2022-01-02 MED ORDER — SACUBITRIL-VALSARTAN 49-51 MG PO TABS: 1.0000 | ORAL_TABLET | Freq: Two times a day (BID) | ORAL | 2 refills | Status: DC

## 2022-01-02 MED ORDER — METOPROLOL SUCCINATE ER 25 MG PO TB24: 37.5000 mg | ORAL_TABLET | Freq: Every day | ORAL | 2 refills | Status: DC

## 2022-01-02 NOTE — Patient Instructions (Signed)
Medication Instructions:  ?Your physician recommends that you continue on your current medications as directed. Please refer to the Current Medication list given to you today. ?*If you need a refill on your cardiac medications before your next appointment, please call your pharmacy* ? ? ?Lab Work: ?None Ordered ? ? ? ?Testing/Procedures: ?None Ordered  ? ? ?Follow-Up: ?At [facility], you and your health needs are our priority.  As part of our continuing mission to provide you with exceptional heart care, we have created designated Provider Care Teams.  These Care Teams include your primary Cardiologist (physician) and Advanced Practice Providers (APPs -  Physician Assistants and Nurse Practitioners) who all work together to provide you with the care you need, when you need it. ? ?We recommend signing up for the patient portal called "MyChart".  Sign up information is provided on this After Visit Summary.  MyChart is used to connect with patients for Virtual Visits (Telemedicine).  Patients are able to view lab/test results, encounter notes, upcoming appointments, etc.  Non-urgent messages can be sent to your provider as well.   ?To learn more about what you can do with MyChart, go to [email or web address].   ? ?Your next appointment:   ?6 month(s) ? ?The format for your next appointment:   ?In Person ? ?Provider:   ?Fransico Him, MD   ? ? ?Other Instructions ? ? ?Important Information About Sugar ? ? ? ? ?  ?

## 2022-01-03 ENCOUNTER — Other Ambulatory Visit: Payer: Self-pay | Admitting: Cardiology

## 2022-01-03 DIAGNOSIS — I5042 Chronic combined systolic (congestive) and diastolic (congestive) heart failure: Secondary | ICD-10-CM

## 2022-01-14 ENCOUNTER — Ambulatory Visit
Admission: RE | Admit: 2022-01-14 | Discharge: 2022-01-14 | Disposition: A | Discharge status: Home / Self Care | Payer: POS | Source: Ambulatory Visit | Attending: Family Medicine | Admitting: Family Medicine

## 2022-01-14 DIAGNOSIS — Z1231 Encounter for screening mammogram for malignant neoplasm of breast: Secondary | ICD-10-CM

## 2022-03-21 ENCOUNTER — Other Ambulatory Visit: Payer: Self-pay | Admitting: Cardiology

## 2022-03-21 DIAGNOSIS — I5042 Chronic combined systolic (congestive) and diastolic (congestive) heart failure: Secondary | ICD-10-CM

## 2022-04-24 ENCOUNTER — Encounter (INDEPENDENT_AMBULATORY_CARE_PROVIDER_SITE_OTHER): Payer: Self-pay

## 2022-07-02 ENCOUNTER — Other Ambulatory Visit: Payer: Self-pay | Admitting: Cardiology

## 2022-07-02 DIAGNOSIS — I5042 Chronic combined systolic (congestive) and diastolic (congestive) heart failure: Secondary | ICD-10-CM

## 2022-07-17 ENCOUNTER — Ambulatory Visit: Payer: POS | Admitting: Cardiology

## 2022-08-05 ENCOUNTER — Ambulatory Visit: Payer: POS | Admitting: Cardiology

## 2022-08-14 NOTE — Progress Notes (Signed)
Cardiology Office Note:    Date:  08/27/2022   ID:  Lindsay Hayden, DOB [birth year], MRN [id number]  PCP:  Kathyrn Lass, [place] [facility] Cardiologist:  Fransico Him, MD     Referring MD: Kathyrn Lass, MD   Chief Complaint:  Follow-up     History of Present Illness:   Lindsay Hayden is a 61 y.o. female   with a hx of chronic combined CHF, nonischemic cardiomyopathy diagnosed in [place], HTN, HLD, DM2, chronic LBBB, and OSA on CPAP.She was initially diagnosed in 2006 with nonischemic cardiomyopathy by Dr. Karie Soda in [place].  Cardiac catheterization performed at the time showed normal coronary arteries.  A few years later, she had another cardiac catheterization in [place] that showed similar finding.    Echocardiogram obtained in July 2020 showed EF 35 to 40%, moderate LVH, LBBB, normal RVSP.  She was unable to tolerate nighttime dose of hydralazine due to tendinitis.  Cardiac MRI obtained on 08/09/2019 showed a moderate LVE, diffuse hypokinesis, EF 43%, no sign of infarction/scar and no evidence of amyloidosis, mild MR, normal RV size and function.  She was unable to tolerate higher dose of Entresto due to problems sleeping, however she is able to tolerate moderate dose of Entresto.  Repeat echocardiogram obtained on 03/20/2021 showed EF improved to 45 to 50%, abnormal septal motion is significantly from LBBB, no regional wall motion abnormality, trivial MR.    Last seen in our office 12/2021 and doing well.   Patient comes in for f/u. Had to cancel appt with Dr. Radford Pax because she thinks she had RSV from grandson.  Used her CPAP during the day to help her breath. Denies chest pain, palpitations, dypsnea, edema. She has had some wheezing and PCP gave her prednisone for 5 day. Works for [employer].    Past Medical History:  Diagnosis Date   Anemia    Arthritis    Asthma    Cardiomyopathy ([place])    EF 45% on echo 03/2021   Chronic combined systolic  and diastolic CHF (congestive heart failure) ([place])    Diabetes mellitus without complication (HCC)    Hypercalcemia    Hyperlipidemia    Hypertension    Hypothyroid    LBBB (left bundle branch block)    LVH (left ventricular hypertrophy)    NICM (nonischemic cardiomyopathy) (HCC)    OSA (obstructive sleep apnea)    Overweight    Prediabetes    Shortness of breath    Sleep apnea    Current Medications: Current Meds  Medication Sig   acetaminophen (TYLENOL) 650 MG CR tablet Take 650 mg by mouth every 8 (eight) hours as needed for pain.   albuterol (PROVENTIL HFA;VENTOLIN HFA) 108 (90 Base) MCG/ACT inhaler Inhale into the lungs every 6 (six) hours as needed for wheezing or shortness of breath.   atorvastatin (LIPITOR) 20 MG tablet Take 1 tablet (20 mg total) by mouth daily.   Cholecalciferol (VITAMIN D) 125 MCG (5000 UT) CAPS Take by mouth 5 days per week.   fluticasone (FLOVENT HFA) 110 MCG/ACT inhaler Inhale 1 puff into the lungs daily as needed (congestion).   furosemide (LASIX) 20 MG tablet TAKE 1 TABLET FIVE DAYS A WEEK   levothyroxine (SYNTHROID) 100 MCG tablet Take 100 mcg by mouth daily before breakfast.   meclizine (ANTIVERT) 25 MG tablet Take 1 tablet (25 mg total) by mouth 3 (three) times daily as needed for dizziness.   metoprolol succinate (TOPROL-XL) 25 MG 24  hr tablet Take 1.5 tablets (37.5 mg total) by mouth daily.   naproxen sodium (ALEVE) 220 MG tablet Take 220 mg by mouth daily as needed (pain).   QVAR REDIHALER 80 MCG/ACT inhaler 1 puff 2 (two) times daily.   sacubitril-valsartan (ENTRESTO) 49-51 MG Take 1 tablet by mouth 2 (two) times daily.   spironolactone (ALDACTONE) 25 MG tablet TAKE 1 TABLET DAILY   tiZANidine (ZANAFLEX) 4 MG tablet Take 4 mg by mouth every 6 (six) hours as needed for muscle spasms.   [DISCONTINUED] dapagliflozin propanediol (FARXIGA) 10 MG TABS tablet TAKE 1 TABLET(10 MG) BY MOUTH DAILY    Allergies:   Patient has no known allergies.    Social History   Tobacco Use   Smoking status: Never   Smokeless tobacco: Never  Vaping Use   Vaping Use: Never used  Substance Use Topics   Alcohol use: Yes    Alcohol/week: 1.0 standard drink of alcohol    Types: 1 Glasses of wine per week    Comment: 1-2 times a month   Drug use: No    Family Hx: The patient's family history includes Alcohol abuse in her father; Cancer in her father; Diabetes in her mother; Hypertension in her father and mother; Obesity in her mother; Stroke in her mother.  ROS     Physical Exam:    VS:  BP 118/72   Pulse (!) 55   Ht '5\' 3"'$  (1.6 m)   Wt 205 lb 12.8 oz (93.4 kg)   LMP 07/16/2017   SpO2 98%   BMI 36.46 kg/m     Wt Readings from Last 3 Encounters:  08/27/22 205 lb 12.8 oz (93.4 kg)  01/02/22 205 lb 6.4 oz (93.2 kg)  07/20/21 199 lb (90.3 kg)    Physical Exam  GEN: Obese, in no acute distress  Neck: no JVD, carotid bruits, or masses Cardiac:RRR; no murmurs, rubs, or gallops  Respiratory:  some wheezing, otherwise clear to auscultation bilaterally, normal work of breathing GI: soft, nontender, nondistended, + BS Ext: without cyanosis, clubbing, or edema, Good distal pulses bilaterally Neuro:  Alert and Oriented x 3,  Psych: euthymic mood, full affect        EKGs/Labs/Other Test Reviewed:    EKG:  EKG is  ordered today.  The ekg ordered today demonstrates sinus bradycardia LBBB  Recent Labs: No results found for requested labs within last 365 days.   Recent Lipid Panel No results for input(s): "CHOL", "TRIG", "HDL", "VLDL", "LDLCALC", "LDLDIRECT" in the last 8760 hours.   Prior CV Studies:   Monitor 08/13/2021 Predominant rhythm was normal sinus rhythm average heart rate of 70 bpm and ranged from 45 to 128 bpm SVT with fastest run at a maximum rate of 158 bpm and longest run lasting 7 beats. Occasional PACs and rare PVCs     Patch Wear Time:  11 days and 9 hours (2022-11-09T20:04:48-0500 to  2022-11-21T05:19:05-499)   Patient had a min HR of 45 bpm, max HR of 158 bpm, and avg HR of 70 bpm. Predominant underlying rhythm was Sinus Rhythm. Bundle Branch Block/IVCD was present. 3 Supraventricular Tachycardia runs occurred, the run with the fastest interval lasting 4 beats  with a max rate of 158 bpm, the longest lasting 7 beats with an avg rate of 133 bpm. Isolated SVEs were rare (<1.0%), SVE Couplets were rare (<1.0%), and no SVE Triplets were present. Isolated VEs were rare (<1.0%), and no VE Couplets or VE Triplets  were present.  Echo 03/20/2021 1. Left ventricular ejection fraction, by estimation, is 45 to 50%. The  left ventricle has mildly decreased function that is likely related to  abnormal septal wall motion with septal/lateral wall dysynchrony from  LBBB. The left ventricle has no regional   wall motion abnormalities. Left ventricular diastolic function could not  be evaluated.   2. Right ventricular systolic function is normal. The right ventricular  size is normal.   3. The mitral valve is normal in structure. Trivial mitral valve  regurgitation. No evidence of mitral stenosis.   4. The aortic valve is normal in structure. Aortic valve regurgitation is  not visualized. No aortic stenosis is present.   5. The inferior vena cava is normal in size with greater than 50%  respiratory variability, suggesting right atrial pressure of 3 mmHg.    cMRI 08/09/2019 IMPRESSION: 1. Moderate LVE with diffuse hypokinesis and abnormal septal motion EF 43%   2. No infarct/scar and no evidence of amyloidosis on delayed inversion recovery sequences with gadolinium   3.  Mild MR   4.  Mild LAE   5.  Normal RV size and function       Risk Assessment/Calculations/Metrics:              ASSESSMENT & PLAN:   No problem-specific Assessment & Plan notes found for this encounter.   Chronic systolic heart failure/nonischemic cardiomyopathy Last echocardiogram July 2022 showed  improved LV function to 45 to 50%.  Euvolemic on exam.  Continue current medical therapy with Lasix, Toprol-XL, spironolactone and Entresto at current dose.    Obstructive sleep apnea Compliant on CPAP      HTN controlled on current medication.  No change.    HLD LDL 92 02/2022-now diabetic-diet discussed - Continue statin . Follow by PCP.   DM2-A1C 7.4-patient doesn't want another pill or shots. Long discussion with her concerning importance of DM control. She hasn't been contacted by PCP yet with this result but have given her info on diet and exercise.           Dispo:  No follow-ups on file.   Medication Adjustments/Labs and Tests Ordered: Current medicines are reviewed at length with the patient today.  Concerns regarding medicines are outlined above.  Tests Ordered: Orders Placed This Encounter  Procedures   EKG 12-Lead   Medication Changes: Meds ordered this encounter  Medications   dapagliflozin propanediol (FARXIGA) 10 MG TABS tablet    Sig: TAKE 1 TABLET(10 MG) BY MOUTH DAILY    Dispense:  90 tablet    Refill:  3   Signed, Ermalinda Barrios, PA-C  08/27/2022 11:56 AM    [address], [place], [place]  [id number] Phone: [phone]; Fax: [phone]

## 2022-08-27 ENCOUNTER — Ambulatory Visit: Payer: POS | Attending: Cardiology | Admitting: Physician Assistant

## 2022-08-27 ENCOUNTER — Encounter: Payer: Self-pay | Admitting: Physician Assistant

## 2022-08-27 VITALS — BP 118/72 | HR 55 | Ht 63.0 in | Wt 205.8 lb

## 2022-08-27 DIAGNOSIS — G4733 Obstructive sleep apnea (adult) (pediatric): Secondary | ICD-10-CM | POA: Diagnosis not present

## 2022-08-27 DIAGNOSIS — I5042 Chronic combined systolic (congestive) and diastolic (congestive) heart failure: Secondary | ICD-10-CM

## 2022-08-27 DIAGNOSIS — I10 Essential (primary) hypertension: Secondary | ICD-10-CM

## 2022-08-27 DIAGNOSIS — E0800 Diabetes mellitus due to underlying condition with hyperosmolarity without nonketotic hyperglycemic-hyperosmolar coma (NKHHC): Secondary | ICD-10-CM

## 2022-08-27 DIAGNOSIS — E785 Hyperlipidemia, unspecified: Secondary | ICD-10-CM

## 2022-08-27 DIAGNOSIS — I428 Other cardiomyopathies: Secondary | ICD-10-CM | POA: Diagnosis not present

## 2022-08-27 MED ORDER — DAPAGLIFLOZIN PROPANEDIOL 10 MG PO TABS: ORAL_TABLET | ORAL | 3 refills | Status: DC

## 2022-08-27 NOTE — Patient Instructions (Addendum)
Medication Instructions:  Your physician recommends that you continue on your current medications as directed. Please refer to the Current Medication list given to you today.  *If you need a refill on your cardiac medications before your next appointment, please call your pharmacy*   Lab Work: None ordered   If you have labs (blood work) drawn today and your tests are completely normal, you will receive your results only by: [place] (if you have MyChart) OR A paper copy in the mail If you have any lab test that is abnormal or we need to change your treatment, we will call you to review the results.   Testing/Procedures: None ordered    Follow-Up: At [facility], you and your health needs are our priority.  As part of our continuing mission to provide you with exceptional heart care, we have created designated Provider Care Teams.  These Care Teams include your primary Cardiologist (physician) and Advanced Practice Providers (APPs -  Physician Assistants and Nurse Practitioners) who all work together to provide you with the care you need, when you need it.  We recommend signing up for the patient portal called "MyChart".  Sign up information is provided on this After Visit Summary.  MyChart is used to connect with patients for Virtual Visits (Telemedicine).  Patients are able to view lab/test results, encounter notes, upcoming appointments, etc.  Non-urgent messages can be sent to your provider as well.   To learn more about what you can do with MyChart, go to [email or web address].    Your next appointment:   12 month(s)  The format for your next appointment:   In Person  Provider:   Fransico Him, MD     Other Instructions Diabetes Mellitus and Nutrition, Adult When you have diabetes, or diabetes mellitus, it is very important to have healthy eating habits because your blood sugar (glucose) levels are greatly affected by what you eat and drink. Eating healthy  foods in the right amounts, at about the same times every day, can help you: Manage your blood glucose. Lower your risk of heart disease. Improve your blood pressure. Reach or maintain a healthy weight. What can affect my meal plan? Every person with diabetes is different, and each person has different needs for a meal plan. Your health care provider may recommend that you work with a dietitian to make a meal plan that is best for you. Your meal plan may vary depending on factors such as: The calories you need. The medicines you take. Your weight. Your blood glucose, blood pressure, and cholesterol levels. Your activity level. Other health conditions you have, such as heart or kidney disease. How do carbohydrates affect me? Carbohydrates, also called carbs, affect your blood glucose level more than any other type of food. Eating carbs raises the amount of glucose in your blood. It is important to know how many carbs you can safely have in each meal. This is different for every person. Your dietitian can help you calculate how many carbs you should have at each meal and for each snack. How does alcohol affect me? Alcohol can cause a decrease in blood glucose (hypoglycemia), especially if you use insulin or take certain diabetes medicines by mouth. Hypoglycemia can be a life-threatening condition. Symptoms of hypoglycemia, such as sleepiness, dizziness, and confusion, are similar to symptoms of having too much alcohol. Do not drink alcohol if: Your health care provider tells you not to drink. You are pregnant, may be pregnant, or are planning  to become pregnant. If you drink alcohol: Limit how much you have to: 0-1 drink a day for women. 0-2 drinks a day for men. Know how much alcohol is in your drink. In the [place] one drink equals one 12 oz bottle of beer (355 mL), one 5 oz glass of wine (148 mL), or one 1 oz glass of hard liquor (44 mL). Keep yourself hydrated with water, diet soda, or  unsweetened iced tea. Keep in mind that regular soda, juice, and other mixers may contain a lot of sugar and must be counted as carbs. What are tips for following this plan?  Reading food labels Start by checking the serving size on the Nutrition Facts label of packaged foods and drinks. The number of calories and the amount of carbs, fats, and other nutrients listed on the label are based on one serving of the item. Many items contain more than one serving per package. Check the total grams (g) of carbs in one serving. Check the number of grams of saturated fats and trans fats in one serving. Choose foods that have a low amount or none of these fats. Check the number of milligrams (mg) of salt (sodium) in one serving. Most people should limit total sodium intake to less than 2,300 mg per day. Always check the nutrition information of foods labeled as "low-fat" or "nonfat." These foods may be higher in added sugar or refined carbs and should be avoided. Talk to your dietitian to identify your daily goals for nutrients listed on the label. Shopping Avoid buying canned, pre-made, or processed foods. These foods tend to be high in fat, sodium, and added sugar. Shop around the outside edge of the grocery store. This is where you will most often find fresh fruits and vegetables, bulk grains, fresh meats, and fresh dairy products. Cooking Use low-heat cooking methods, such as baking, instead of high-heat cooking methods, such as deep frying. Cook using healthy oils, such as olive, canola, or sunflower oil. Avoid cooking with butter, cream, or high-fat meats. Meal planning Eat meals and snacks regularly, preferably at the same times every day. Avoid going long periods of time without eating. Eat foods that are high in fiber, such as fresh fruits, vegetables, beans, and whole grains. Eat 4-6 oz (112-168 g) of lean protein each day, such as lean meat, chicken, fish, eggs, or tofu. One ounce (oz) (28 g) of  lean protein is equal to: 1 oz (28 g) of meat, chicken, or fish. 1 egg.  cup (62 g) of tofu. Eat some foods each day that contain healthy fats, such as avocado, nuts, seeds, and fish. What foods should I eat? Fruits Berries. Apples. Oranges. Peaches. Apricots. Plums. Grapes. Mangoes. Papayas. Pomegranates. Kiwi. Cherries. Vegetables Leafy greens, including lettuce, spinach, kale, chard, collard greens, mustard greens, and cabbage. Beets. Cauliflower. Broccoli. Carrots. Green beans. Tomatoes. Peppers. Onions. Cucumbers. Brussels sprouts. Grains Whole grains, such as whole-wheat or whole-grain bread, crackers, tortillas, cereal, and pasta. Unsweetened oatmeal. Quinoa. Brown or wild rice. Meats and other proteins Seafood. Poultry without skin. Lean cuts of poultry and beef. Tofu. Nuts. Seeds. Dairy Low-fat or fat-free dairy products such as milk, yogurt, and cheese. The items listed above may not be a complete list of foods and beverages you can eat and drink. Contact a dietitian for more information. What foods should I avoid? Fruits Fruits canned with syrup. Vegetables Canned vegetables. Frozen vegetables with butter or cream sauce. Grains Refined white flour and flour products such as bread,  pasta, snack foods, and cereals. Avoid all processed foods. Meats and other proteins Fatty cuts of meat. Poultry with skin. Breaded or fried meats. Processed meat. Avoid saturated fats. Dairy Full-fat yogurt, cheese, or milk. Beverages Sweetened drinks, such as soda or iced tea. The items listed above may not be a complete list of foods and beverages you should avoid. Contact a dietitian for more information. Questions to ask a health care provider Do I need to meet with a certified diabetes care and education specialist? Do I need to meet with a dietitian? What number can I call if I have questions? When are the best times to check my blood glucose? Where to find more information: American  Diabetes Association: diabetes.org Academy of Nutrition and Dietetics: eatright.[employer] of Diabetes and Digestive and Kidney Diseases: [employer]en[employer].is Association of Diabetes Care & Education Specialists: diabeteseducator.org Summary It is important to have healthy eating habits because your blood sugar (glucose) levels are greatly affected by what you eat and drink. It is important to use alcohol carefully. A healthy meal plan will help you manage your blood glucose and lower your risk of heart disease. Your health care provider may recommend that you work with a dietitian to make a meal plan that is best for you. This information is not intended to replace advice given to you by your health care provider. Make sure you discuss any questions you have with your health care provider. Document Revised: 04/05/2020 Document Reviewed: 04/05/2020 Elsevier Patient Education  [place].

## 2022-09-11 ENCOUNTER — Other Ambulatory Visit: Payer: Self-pay | Admitting: Cardiology

## 2022-09-11 DIAGNOSIS — I5042 Chronic combined systolic (congestive) and diastolic (congestive) heart failure: Secondary | ICD-10-CM

## 2022-09-24 ENCOUNTER — Other Ambulatory Visit: Payer: Self-pay | Admitting: Cardiology

## 2022-09-24 DIAGNOSIS — I5042 Chronic combined systolic (congestive) and diastolic (congestive) heart failure: Secondary | ICD-10-CM

## 2022-09-25 MED ORDER — SACUBITRIL-VALSARTAN 49-51 MG PO TABS: 1.0000 | ORAL_TABLET | Freq: Two times a day (BID) | ORAL | 3 refills | Status: DC

## 2022-11-13 ENCOUNTER — Institutional Professional Consult (permissible substitution): Payer: POS | Admitting: Pulmonary Disease

## 2022-11-19 ENCOUNTER — Ambulatory Visit (INDEPENDENT_AMBULATORY_CARE_PROVIDER_SITE_OTHER): Payer: Commercial Managed Care - PPO | Admitting: Pulmonary Disease

## 2022-11-19 ENCOUNTER — Encounter: Payer: Self-pay | Admitting: Pulmonary Disease

## 2022-11-19 VITALS — BP 112/68 | HR 66 | Ht 62.5 in | Wt 205.0 lb

## 2022-11-19 DIAGNOSIS — R0602 Shortness of breath: Secondary | ICD-10-CM | POA: Diagnosis not present

## 2022-11-19 NOTE — Patient Instructions (Signed)
Continue inhaler  Exercise regularly  PFT at next visit  See you in 3 months

## 2022-11-19 NOTE — Progress Notes (Signed)
Lindsay Hayden    [id number]    02/20/1961  Primary Care Physician:Miller, Lattie Haw, MD  Referring Physician: Kathyrn Lass, [place],  [place] [address]  Chief complaint:  Patient being seen for  Asthma  HPI:  Came in complaining of wheezing, had grand children that had RSV in December, has been having persistent wheezing and shortness of breath since then Past history of asthma She was not tested for RSV  Exposed to secondhand smoke growing up, never smoker  Worked in the post [employer], office work  Currently on [place] and albuterol as needed  History of hypertension, asthma, hypercholesterolemia, obstructive sleep apnea for which she uses CPAP therapy [facility] Last sleep study was in 2017  She has a dry cough   Outpatient Encounter Medications as of 11/19/2022  Medication Sig   acetaminophen (TYLENOL) 650 MG CR tablet Take 650 mg by mouth every 8 (eight) hours as needed for pain.   albuterol (PROVENTIL HFA;VENTOLIN HFA) 108 (90 Base) MCG/ACT inhaler Inhale into the lungs every 6 (six) hours as needed for wheezing or shortness of breath.   atorvastatin (LIPITOR) 20 MG tablet Take 1 tablet (20 mg total) by mouth daily.   budesonide-formoterol (BREYNA) 80-4.5 MCG/ACT inhaler Inhale 2 puffs into the lungs 2 (two) times daily.   Cholecalciferol (VITAMIN D) 125 MCG (5000 UT) CAPS Take by mouth 5 days per week.   dapagliflozin propanediol (FARXIGA) 10 MG TABS tablet TAKE 1 TABLET(10 MG) BY MOUTH DAILY   fluticasone (FLOVENT HFA) 110 MCG/ACT inhaler Inhale 1 puff into the lungs daily as needed (congestion).   furosemide (LASIX) 20 MG tablet TAKE 1 TABLET FIVE DAYS A WEEK   levothyroxine (SYNTHROID) 100 MCG tablet Take 100 mcg by mouth daily before breakfast.   meclizine (ANTIVERT) 25 MG tablet Take 1 tablet (25 mg total) by mouth 3 (three) times daily as needed for dizziness.   metoprolol succinate (TOPROL-XL) 25 MG 24 hr tablet TAKE  ONE AND ONE-HALF TABLETS DAILY   naproxen sodium (ALEVE) 220 MG tablet Take 220 mg by mouth daily as needed (pain).   sacubitril-valsartan (ENTRESTO) 49-51 MG Take 1 tablet by mouth 2 (two) times daily.   spironolactone (ALDACTONE) 25 MG tablet TAKE 1 TABLET DAILY   QVAR REDIHALER 80 MCG/ACT inhaler 1 puff 2 (two) times daily.   tiZANidine (ZANAFLEX) 4 MG tablet Take 4 mg by mouth every 6 (six) hours as needed for muscle spasms.   No facility-administered encounter medications on file as of 11/19/2022.    Allergies as of 11/19/2022   (No Known Allergies)    Past Medical History:  Diagnosis Date   Anemia    Arthritis    Asthma    Cardiomyopathy ([place])    EF 45% on echo 03/2021   Chronic combined systolic and diastolic CHF (congestive heart failure) ([place])    Diabetes mellitus without complication (HCC)    Hypercalcemia    Hyperlipidemia    Hypertension    Hypothyroid    LBBB (left bundle branch block)    LVH (left ventricular hypertrophy)    NICM (nonischemic cardiomyopathy) (HCC)    OSA (obstructive sleep apnea)    Overweight    Prediabetes    Shortness of breath    Sleep apnea     Past Surgical History:  Procedure Laterality Date   ACHILLES TENDON SURGERY     right   CESAREAN SECTION     x2  KNEE ARTHROSCOPY WITH MENISCAL REPAIR     right knee   OVARIAN CYST REMOVAL     right    Family History  Problem Relation Age of Onset   Stroke Mother    Diabetes Mother    Hypertension Mother    Obesity Mother    Hypertension Father    Cancer Father    Alcohol abuse Father     Social History   Socioeconomic History   Marital status: Single    Spouse name: Not on file   Number of children: 2   Years of education: AD   Highest education level: Not on file  Occupational History   Occupation: Scientific laboratory technician: [employer]  Tobacco Use   Smoking status: Never   Smokeless tobacco: Never  Vaping Use   Vaping Use: Never used  Substance and Sexual Activity    Alcohol use: Yes    Alcohol/week: 1.0 standard drink of alcohol    Types: 1 Glasses of wine per week    Comment: 1-2 times a month   Drug use: No   Sexual activity: Not on file  Other Topics Concern   Not on file  Social History Narrative   Patient is single, lives with boyfriend in a single story home, though does have a full basement that she rarely goes down the stairs to. No pets.   Social Determinants of Health   Financial Resource Strain: Not on file  Food Insecurity: Not on file  Transportation Needs: Not on file  Physical Activity: Not on file  Stress: Not on file  Social Connections: Not on file  Intimate Partner Violence: Not on file    Review of Systems  Constitutional:  Negative for fatigue.  Respiratory:  Positive for apnea, choking and shortness of breath.   Psychiatric/Behavioral:  Positive for sleep disturbance.     Vitals:   11/19/22 1338  BP: 112/68  Pulse: 66  SpO2: 99%     Physical Exam Constitutional:      Appearance: She is obese.  HENT:     Head: Normocephalic.     Mouth/Throat:     Mouth: Mucous membranes are moist.  Eyes:     General: No scleral icterus.    Pupils: Pupils are equal, round, and reactive to light.  Cardiovascular:     Rate and Rhythm: Normal rate and regular rhythm.     Heart sounds: No murmur heard. Pulmonary:     Effort: No respiratory distress.     Breath sounds: No stridor. No wheezing or rhonchi.  Musculoskeletal:     Cervical back: No rigidity or tenderness.  Neurological:     Mental Status: She is alert.  Psychiatric:        Mood and Affect: Mood normal.    Data Reviewed: No PFT on record  Most recent checks x-ray November 2022 with no acute infiltrate  Assessment:  Shortness of breath, asthma  Inhaler technique was reviewed and discussed extensively  Protracted symptoms likely related to recent respiratory viral infection, appears to be stabilizing at present  History of obstructive sleep apnea,  compliant with treatment, managed by [facility]   Plan/Recommendations: Delcie Roch  Continue albuterol as needed  Obtain PFT at next visit  Importance of regular graded exercise was discussed with the patient  Follow-up in 3 months   Sherrilyn Rist MD [facility] 11/19/2022, 8:10 PM  CC: Kathyrn Lass, MD

## 2022-11-28 ENCOUNTER — Other Ambulatory Visit: Payer: Self-pay | Admitting: Cardiology

## 2022-11-28 DIAGNOSIS — I5042 Chronic combined systolic (congestive) and diastolic (congestive) heart failure: Secondary | ICD-10-CM

## 2022-12-17 ENCOUNTER — Other Ambulatory Visit: Payer: Self-pay | Admitting: Family Medicine

## 2022-12-17 DIAGNOSIS — Z1231 Encounter for screening mammogram for malignant neoplasm of breast: Secondary | ICD-10-CM

## 2022-12-30 ENCOUNTER — Other Ambulatory Visit: Payer: Self-pay | Admitting: Cardiology

## 2022-12-30 ENCOUNTER — Encounter: Payer: Self-pay | Admitting: Pharmacist

## 2022-12-30 ENCOUNTER — Telehealth: Payer: Self-pay | Admitting: Cardiology

## 2022-12-30 DIAGNOSIS — I5042 Chronic combined systolic (congestive) and diastolic (congestive) heart failure: Secondary | ICD-10-CM

## 2022-12-30 NOTE — Telephone Encounter (Signed)
Pt c/o medication issue:  1. Name of Medication: sacubitril-valsartan (ENTRESTO) 49-51 MG   2. How are you currently taking this medication (dosage and times per day)? As directed  3. Are you having a reaction (difficulty breathing--STAT)? no  4. What is your medication issue? Patient is having trouble paying for this medication. She states that she went to get a refill and the cost was $300. She is requesting to speak with Iran Planas, [facility] regarding a coupon card he has given her in the past which allows her to get her medication for $10. She is requesting to get another card, please advise.

## 2022-12-30 NOTE — Telephone Encounter (Signed)
Unable to get new $10/month card for pt but did enroll her in free 30 day card and provided her with # for her to call in and reactivate $10 card.

## 2023-01-20 ENCOUNTER — Ambulatory Visit
Admission: RE | Admit: 2023-01-20 | Discharge: 2023-01-20 | Disposition: A | Discharge status: Home / Self Care | Payer: Commercial Managed Care - PPO | Source: Ambulatory Visit | Attending: Family Medicine | Admitting: Family Medicine

## 2023-01-20 DIAGNOSIS — Z1231 Encounter for screening mammogram for malignant neoplasm of breast: Secondary | ICD-10-CM

## 2023-01-23 ENCOUNTER — Other Ambulatory Visit: Payer: Self-pay | Admitting: Family Medicine

## 2023-01-23 DIAGNOSIS — R928 Other abnormal and inconclusive findings on diagnostic imaging of breast: Secondary | ICD-10-CM

## 2023-02-04 ENCOUNTER — Other Ambulatory Visit: Payer: Self-pay | Admitting: Family Medicine

## 2023-02-04 ENCOUNTER — Ambulatory Visit
Admission: RE | Admit: 2023-02-04 | Discharge: 2023-02-04 | Disposition: A | Discharge status: Home / Self Care | Payer: 59 | Source: Ambulatory Visit | Attending: Family Medicine | Admitting: Family Medicine

## 2023-02-04 DIAGNOSIS — R928 Other abnormal and inconclusive findings on diagnostic imaging of breast: Secondary | ICD-10-CM

## 2023-02-13 ENCOUNTER — Ambulatory Visit
Admission: RE | Admit: 2023-02-13 | Discharge: 2023-02-13 | Disposition: A | Discharge status: Home / Self Care | Payer: 59 | Source: Ambulatory Visit | Attending: Family Medicine | Admitting: Family Medicine

## 2023-02-13 DIAGNOSIS — R928 Other abnormal and inconclusive findings on diagnostic imaging of breast: Secondary | ICD-10-CM

## 2023-02-13 HISTORY — PX: BREAST BIOPSY: SHX20

## 2023-02-24 ENCOUNTER — Ambulatory Visit (INDEPENDENT_AMBULATORY_CARE_PROVIDER_SITE_OTHER): Payer: 59 | Admitting: Nurse Practitioner

## 2023-02-24 ENCOUNTER — Ambulatory Visit: Payer: 59 | Admitting: Pulmonary Disease

## 2023-02-24 ENCOUNTER — Encounter: Payer: Self-pay | Admitting: Nurse Practitioner

## 2023-02-24 VITALS — BP 122/82 | HR 71 | Ht 63.0 in | Wt 209.0 lb

## 2023-02-24 DIAGNOSIS — R0602 Shortness of breath: Secondary | ICD-10-CM

## 2023-02-24 DIAGNOSIS — I5042 Chronic combined systolic (congestive) and diastolic (congestive) heart failure: Secondary | ICD-10-CM

## 2023-02-24 DIAGNOSIS — J4530 Mild persistent asthma, uncomplicated: Secondary | ICD-10-CM

## 2023-02-24 DIAGNOSIS — R0609 Other forms of dyspnea: Secondary | ICD-10-CM

## 2023-02-24 LAB — PULMONARY FUNCTION TEST
DL/VA % pred: 131 %
DL/VA: 5.59 ml/min/mmHg/L
DLCO cor % pred: 96 %
DLCO cor: 19.03 ml/min/mmHg
DLCO unc % pred: 96 %
DLCO unc: 19.03 ml/min/mmHg
FEF 25-75 Post: 1.36 L/s
FEF 25-75 Pre: 1.26 L/s
FEF2575-%Change-Post: 8 %
FEF2575-%Pred-Post: 58 %
FEF2575-%Pred-Pre: 53 %
FEV1-%Change-Post: 2 %
FEV1-%Pred-Post: 59 %
FEV1-%Pred-Pre: 57 %
FEV1-Post: 1.47 L
FEV1-Pre: 1.43 L
FEV1FVC-%Change-Post: 0 %
FEV1FVC-%Pred-Pre: 97 %
FEV6-%Change-Post: 2 %
FEV6-%Pred-Post: 62 %
FEV6-%Pred-Pre: 60 %
FEV6-Post: 1.93 L
FEV6-Pre: 1.88 L
FEV6FVC-%Pred-Post: 103 %
FEV6FVC-%Pred-Pre: 103 %
FVC-%Change-Post: 2 %
FVC-%Pred-Post: 60 %
FVC-%Pred-Pre: 58 %
FVC-Post: 1.93 L
FVC-Pre: 1.88 L
Post FEV1/FVC ratio: 76 %
Post FEV6/FVC ratio: 100 %
Pre FEV1/FVC ratio: 76 %
Pre FEV6/FVC Ratio: 100 %
RV % pred: 142 %
RV: 2.74 L
TLC % pred: 106 %
TLC: 5.21 L

## 2023-02-24 NOTE — Patient Instructions (Signed)
Full PFT performed today. °

## 2023-02-24 NOTE — Progress Notes (Signed)
Full PFT performed today. °

## 2023-02-24 NOTE — Progress Notes (Unsigned)
@Patient  ID: Lindsay Hayden, female    DOB: [birth year], 62 y.o.   MRN: [id number]  Chief Complaint  Patient presents with   Follow-up    Pft review     Referring provider: Sigmund Hazel, MD  HPI: 62 year old female, never smoker followed for asthma.  She is a patient of Dr. Trena Platt and last seen in office 11/19/2022.  Past medical history significant for hypertension, hypercholesterolemia, OSA on CPAP, DCM, CHF, DM 2, hypothyroid, obesity.  TEST/EVENTS:  03/20/2021 echo: EF 45 to 50%.  RV size and function normal.  Trivial MR. 07/20/2021 CXR 2 view: No acute disease.  Lungs clear.  Diameter of the heart is slightly increased. 02/24/2023 PFT: FVC 58, FEV1 57, ratio 76, TLC 106, DLCO 96.  No BD  11/19/2022: OV with Dr. Wynona Neat.  Complaining of wheezing.  Had grandchildren that had RSV in December.  She has been having trouble with the wheezing and shortness of breath since then.  Past history of asthma.  Never tested for RSV.  Exposed to secondhand smoke growing up but never smoked.  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  Currently on Breyna and albuterol as needed.  Has a dry cough.  Appears to be stabilizing at present.  Felt to be related to recent respiratory viral infection.  Advised for her to continue her inhalers.  Work on graded exercises.  Obtain PFT at follow-up.  02/24/2023: Today - follow up Patient presents today for follow-up after pulmonary function testing.  PFTs showed a reduction in lung function without formal obstruction or restriction.  She has some evidence of mild air trapping.  She had a normal diffusing capacity.  No significant bronchodilator response.  She tells me that she overall feels the same as she did last time she was here.  Symptoms improved since her RSV infection in December.  She does still have some shortness of breath with exertion but overall seems better.  No longer struggling with a cough.  Not noticing much wheezing.  Uses her rescue inhaler maybe  once or twice a week.  Denies any chest congestion, fevers, chills, lower extremity swelling, orthopnea.  She uses her Breyna inhaler twice daily.  Is not very active  No Known Allergies  Immunization History  Administered Date(s) Administered   Influenza Whole 08/19/2022   PFIZER(Purple Top)SARS-COV-2 Vaccination 12/27/2019, 01/18/2020    Past Medical History:  Diagnosis Date   Anemia    Arthritis    Asthma    Cardiomyopathy (HCC)    EF 45% on echo 03/2021   Chronic combined systolic and diastolic CHF (congestive heart failure) (HCC)    Diabetes mellitus without complication (HCC)    Hypercalcemia    Hyperlipidemia    Hypertension    Hypothyroid    LBBB (left bundle branch block)    LVH (left ventricular hypertrophy)    NICM (nonischemic cardiomyopathy) (HCC)    OSA (obstructive sleep apnea)    Overweight    Prediabetes    Shortness of breath    Sleep apnea     Tobacco History: Social History   Tobacco Use  Smoking Status Never  Smokeless Tobacco Never   Counseling given: Not Answered   Outpatient Medications Prior to Visit  Medication Sig Dispense Refill   acetaminophen (TYLENOL) 650 MG CR tablet Take 650 mg by mouth every 8 (eight) hours as needed for pain.     albuterol (PROVENTIL HFA;VENTOLIN HFA) 108 (90 Base) MCG/ACT inhaler Inhale into the lungs every 6 (six)  hours as needed for wheezing or shortness of breath.     atorvastatin (LIPITOR) 20 MG tablet Take 1 tablet (20 mg total) by mouth daily. 90 tablet 0   budesonide-formoterol (BREYNA) 80-4.5 MCG/ACT inhaler Inhale 2 puffs into the lungs 2 (two) times daily.     Cholecalciferol (VITAMIN D) 125 MCG (5000 UT) CAPS Take by mouth 5 days per week. 30 capsule 0   dapagliflozin propanediol (FARXIGA) 10 MG TABS tablet TAKE 1 TABLET(10 MG) BY MOUTH DAILY 90 tablet 3   furosemide (LASIX) 20 MG tablet TAKE 1 TABLET FIVE DAYS A WEEK 75 tablet 2   levothyroxine (SYNTHROID) 100 MCG tablet Take 100 mcg by mouth daily  before breakfast.     meclizine (ANTIVERT) 25 MG tablet Take 1 tablet (25 mg total) by mouth 3 (three) times daily as needed for dizziness. 30 tablet 0   metoprolol succinate (TOPROL-XL) 25 MG 24 hr tablet TAKE ONE AND ONE-HALF TABLETS DAILY 135 tablet 3   naproxen sodium (ALEVE) 220 MG tablet Take 220 mg by mouth daily as needed (pain).     RYBELSUS 3 MG TABS Take 1 tablet by mouth every morning.     sacubitril-valsartan (ENTRESTO) 49-51 MG Take 1 tablet by mouth 2 (two) times daily. 180 tablet 3   spironolactone (ALDACTONE) 25 MG tablet TAKE 1 TABLET DAILY 90 tablet 2   fluticasone (FLOVENT HFA) 110 MCG/ACT inhaler Inhale 1 puff into the lungs daily as needed (congestion).     No facility-administered medications prior to visit.     Review of Systems:   Constitutional: No weight loss or gain, night sweats, fevers, chills, or lassitude. +baseline fatigue HEENT: No headaches, difficulty swallowing, tooth/dental problems, or sore throat. No sneezing, itching, ear ache, nasal congestion, or post nasal drip CV:  No chest pain, orthopnea, PND, swelling in lower extremities, anasarca, dizziness, palpitations, syncope Resp: +shortness of breath with exertion. No excess mucus or change in color of mucus. No productive or non-productive. No hemoptysis. No wheezing.  No chest wall deformity GI:  No heartburn, indigestion Skin: No rash, lesions, ulcerations MSK:  No joint pain or swelling.   Neuro: No dizziness or lightheadedness.  Psych: No depression or anxiety. Mood stable.     Physical Exam:  BP 122/82   Pulse 71   Ht 5\' 3"  (1.6 m)   Wt 209 lb (94.8 kg)   LMP 07/16/2017   SpO2 97%   BMI 37.02 kg/m   GEN: Pleasant, interactive, well-appearing; obese; in no acute distress. HEENT:  Normocephalic and atraumatic. PERRLA. Sclera white. Nasal turbinates pink, moist and patent bilaterally. No rhinorrhea present. Oropharynx pink and moist, without exudate or edema. No lesions, ulcerations, or  postnasal drip.  NECK:  Supple w/ fair ROM. No JVD present. Normal carotid impulses w/o bruits. Thyroid symmetrical with no goiter or nodules palpated. No lymphadenopathy.   CV: RRR, no m/r/g, no peripheral edema. Pulses intact, +2 bilaterally. No cyanosis, pallor or clubbing. PULMONARY:  Unlabored, regular breathing. Clear bilaterally A&P w/o wheezes/rales/rhonchi. No accessory muscle use.  GI: BS present and normoactive. Soft, non-tender to palpation. No organomegaly or masses detected. MSK: No erythema, warmth or tenderness. Cap refil <2 sec all extrem. No deformities or joint swelling noted.  Neuro: A/Ox3. No focal deficits noted.   Skin: Warm, no lesions or rashe Psych: Normal affect and behavior. Judgement and thought content appropriate.     Lab Results:  CBC    Component Value Date/Time   WBC 5.5 07/20/2021 0955  RBC 4.83 07/20/2021 0955   HGB 13.7 07/20/2021 0955   HGB 13.6 12/13/2019 1246   HCT 41.8 07/20/2021 0955   HCT 42.4 12/13/2019 1246   PLT 232 07/20/2021 0955   PLT 296 12/13/2019 1246   MCV 86.5 07/20/2021 0955   MCV 87 12/13/2019 1246   MCH 28.4 07/20/2021 0955   MCHC 32.8 07/20/2021 0955   RDW 14.4 07/20/2021 0955   RDW 12.9 12/13/2019 1246   LYMPHSABS 1.6 12/13/2019 1246   MONOABS 0.2 06/09/2017 1818   EOSABS 0.3 12/13/2019 1246   BASOSABS 0.1 12/13/2019 1246    BMET    Component Value Date/Time   NA 141 07/20/2021 0955   NA 142 10/23/2020 1403   K 3.8 07/20/2021 0955   CL 110 07/20/2021 0955   CO2 22 07/20/2021 0955   GLUCOSE 116 (H) 07/20/2021 0955   BUN 22 (H) 07/20/2021 0955   BUN 24 10/23/2020 1403   CREATININE 0.94 07/20/2021 0955   CALCIUM 10.0 07/20/2021 0955   GFRNONAA >60 07/20/2021 0955   GFRAA 67 10/23/2020 1403    BNP    Component Value Date/Time   BNP 41.2 07/20/2021 0955     Imaging:  MM RT BREAST BX W LOC DEV 1ST LESION IMAGE BX SPEC STEREO GUIDE  Addendum Date: 02/18/2023   ADDENDUM REPORT: 02/18/2023 08:29  ADDENDUM: Pathology revealed USUAL DUCTAL HYPERPLASIA (UDH) AND FIBROCYSTIC CHANGES INCLUDING STROMAL FIBROSIS, CYSTIC DILATATION AND APOCRINE METAPLASIA, ADENOSIS of the RIGHT breast, 5:30 o'clock, (X clip). Multiple deeper levels were examined. Definitive microcalcifications are not identified on the levels examined. The three blocks and specimen container were x-rayed. Imaging did not show evidence of microcalcifications. This was found to be concordant by Dr. Amie Portland. Pathology results were discussed with the patient by telephone. The patient reported doing well after the biopsy with tenderness and bruising at the site. Post biopsy instructions and care were reviewed and questions were answered. The patient was encouraged to call [facility] for any additional concerns. My direct phone number was provided. The patient was instructed to return for annual screening mammography in May 2025. Pathology results reported by Rene Kocher, RN on 02/18/2023. Electronically Signed   By: Amie Portland M.D.   On: 02/18/2023 08:29   Result Date: 02/18/2023 CLINICAL DATA:  Patient presents for stereotactic core needle biopsy of a small group of right breast calcifications. EXAM: RIGHT BREAST STEREOTACTIC CORE NEEDLE BIOPSY COMPARISON:  Previous exam(s). FINDINGS: The patient and I discussed the procedure of stereotactic-guided biopsy including benefits and alternatives. We discussed the high likelihood of a successful procedure. We discussed the risks of the procedure including infection, bleeding, tissue injury, clip migration, and inadequate sampling. Informed written consent was given. The usual time out protocol was performed immediately prior to the procedure. Using sterile technique and 1% Lidocaine as local anesthetic, under stereotactic guidance, a 9 gauge vacuum assisted device was used to perform core needle biopsy of calcifications in the anterior, lower inner quadrant of the right  breast using a superior approach. Specimen radiograph was performed showing several calcifications for which biopsy was performed. Specimens with calcifications are identified for pathology. Lesion quadrant: Lower inner quadrant At the conclusion of the procedure, an X shaped tissue marker clip was deployed into the biopsy cavity. Follow-up 2-view mammogram was performed and dictated separately. IMPRESSION: Stereotactic-guided biopsy of right breast calcifications. No apparent complications. Electronically Signed: By: Amie Portland M.D. On: 02/13/2023 10:44  MM CLIP PLACEMENT RIGHT  Result Date: 02/13/2023 CLINICAL DATA:  Assess post biopsy marker clip placement following stereotactic core needle biopsy of right breast calcifications. EXAM: 3D DIAGNOSTIC RIGHT MAMMOGRAM POST STEREOTACTIC BIOPSY COMPARISON:  Previous exam(s). FINDINGS: 3D Mammographic images were obtained following stereotactic guided biopsy of right breast calcifications. The biopsy marking clip is in expected position at the site of biopsy. IMPRESSION: Appropriate positioning of the X shaped biopsy marking clip at the site of biopsy in the anterior, lower inner right breast in the expected location of the small group of calcifications. Final Assessment: Post Procedure Mammograms for Marker Placement Electronically Signed   By: Amie Portland M.D.   On: 02/13/2023 10:46  MM Digital Diagnostic Unilat R  Result Date: 02/04/2023 CLINICAL DATA:  Patient returns today to evaluate RIGHT breast calcifications identified on recent screening mammogram. EXAM: DIGITAL DIAGNOSTIC UNILATERAL RIGHT MAMMOGRAM TECHNIQUE: Right digital diagnostic mammography was performed. COMPARISON:  Previous exam(s). ACR Breast Density Category b: There are scattered areas of fibroglandular density. FINDINGS: On today's additional diagnostic views with magnification, grouped coarse and punctate calcifications are confirmed within the lower inner quadrant of the RIGHT  breast, at anterior depth, measuring 3 mm extent. IMPRESSION: Indeterminate grouped coarse and punctate calcifications within the lower inner quadrant of the RIGHT breast, measuring 3 mm extent. Stereotactic biopsy is recommended to ensure benignity. RECOMMENDATION: Stereotactic biopsy for the RIGHT breast calcifications. Stereotactic biopsy will be scheduled at patient's convenience before the patient leaves the department. I have discussed the findings and recommendations with the patient. If applicable, a reminder letter will be sent to the patient regarding the next appointment. BI-RADS CATEGORY  4: Suspicious. Electronically Signed   By: Bary Richard M.D.   On: 02/04/2023 14:09        Latest Ref Rng & Units 02/24/2023    1:54 PM  PFT Results  FVC-Pre L 1.88  P  FVC-Predicted Pre % 58  P  FVC-Post L 1.93  P  FVC-Predicted Post % 60  P  Pre FEV1/FVC % % 76  P  Post FEV1/FCV % % 76  P  FEV1-Pre L 1.43  P  FEV1-Predicted Pre % 57  P  FEV1-Post L 1.47  P  DLCO uncorrected ml/min/mmHg 19.03  P  DLCO UNC% % 96  P  DLCO corrected ml/min/mmHg 19.03  P  DLCO COR %Predicted % 96  P  DLVA Predicted % 131  P  TLC L 5.21  P  TLC % Predicted % 106  P  RV % Predicted % 142  P    P Preliminary result    No results found for: "NITRICOXIDE"      Assessment & Plan:   Mild persistent asthma, uncomplicated Compensated on current regimen.  No recent exacerbations requiring steroids or antibiotics.  She has improved since her RSV infection.  Suspect that residual DOE is multifactorial.  No formal obstruction or restriction on pulmonary function testing.  Diffusion capacity was normal.  Reviewed how weight and deconditioning plays a role.  Encouraged her to start working on graded exercises.  She will continue ICS/LABA therapy for maintenance.  Action plan in place.  Patient Instructions  Continue Albuterol inhaler 2 puffs or 3 mL neb every 6 hours as needed for shortness of breath or wheezing.  Notify if symptoms persist despite rescue inhaler/neb use. Continue Breyna 2 puffs Twice daily. Brush tongue and rinse mouth afterwards Continue flonase nasal spray 2 sprays each nostril daily  Continue CPAP nightly.   Work on graded exercises and  increasing activity levels  Follow up in 6 months with Dr. Wynona Neat. If symptoms worsen, please contact office for sooner follow up or seek emergency care.     DOE (dyspnea on exertion) See above.  Chronic combined systolic and diastolic CHF (congestive heart failure) (HCC) Euvolemic on exam. Contributing to DOE. Follow up with cardiology as scheduled    I spent 35 minutes of dedicated to the care of this patient on the date of this encounter to include pre-visit review of records, face-to-face time with the patient discussing conditions above, post visit ordering of testing, clinical documentation with the electronic health record, making appropriate referrals as documented, and communicating necessary findings to members of the patients care team.  Noemi Chapel, NP 02/25/2023  Pt aware and understands NP's role.

## 2023-02-24 NOTE — Patient Instructions (Signed)
Continue Albuterol inhaler 2 puffs or 3 mL neb every 6 hours as needed for shortness of breath or wheezing. Notify if symptoms persist despite rescue inhaler/neb use. Continue Breyna 2 puffs Twice daily. Brush tongue and rinse mouth afterwards Continue flonase nasal spray 2 sprays each nostril daily  Continue CPAP nightly.   Work on graded exercises and increasing activity levels  Follow up in 6 months with Dr. Wynona Neat. If symptoms worsen, please contact office for sooner follow up or seek emergency care.

## 2023-02-25 ENCOUNTER — Encounter: Payer: Self-pay | Admitting: Nurse Practitioner

## 2023-02-25 DIAGNOSIS — R0609 Other forms of dyspnea: Secondary | ICD-10-CM | POA: Insufficient documentation

## 2023-02-25 NOTE — Assessment & Plan Note (Signed)
Euvolemic on exam. Contributing to DOE. Follow up with cardiology as scheduled

## 2023-02-25 NOTE — Assessment & Plan Note (Signed)
See above

## 2023-02-25 NOTE — Assessment & Plan Note (Signed)
Compensated on current regimen.  No recent exacerbations requiring steroids or antibiotics.  She has improved since her RSV infection.  Suspect that residual DOE is multifactorial.  No formal obstruction or restriction on pulmonary function testing.  Diffusion capacity was normal.  Reviewed how weight and deconditioning plays a role.  Encouraged her to start working on graded exercises.  She will continue ICS/LABA therapy for maintenance.  Action plan in place.  Patient Instructions  Continue Albuterol inhaler 2 puffs or 3 mL neb every 6 hours as needed for shortness of breath or wheezing. Notify if symptoms persist despite rescue inhaler/neb use. Continue Breyna 2 puffs Twice daily. Brush tongue and rinse mouth afterwards Continue flonase nasal spray 2 sprays each nostril daily  Continue CPAP nightly.   Work on graded exercises and increasing activity levels  Follow up in 6 months with Dr. Wynona Neat. If symptoms worsen, please contact office for sooner follow up or seek emergency care.

## 2023-06-24 ENCOUNTER — Encounter: Payer: Self-pay | Admitting: Podiatry

## 2023-06-24 ENCOUNTER — Ambulatory Visit: Payer: 59 | Admitting: Podiatry

## 2023-06-24 ENCOUNTER — Ambulatory Visit (INDEPENDENT_AMBULATORY_CARE_PROVIDER_SITE_OTHER): Payer: 59

## 2023-06-24 DIAGNOSIS — M62462 Contracture of muscle, left lower leg: Secondary | ICD-10-CM

## 2023-06-24 DIAGNOSIS — M7662 Achilles tendinitis, left leg: Secondary | ICD-10-CM

## 2023-06-24 DIAGNOSIS — M778 Other enthesopathies, not elsewhere classified: Secondary | ICD-10-CM

## 2023-06-24 MED ORDER — DICLOFENAC SODIUM 1 % EX GEL: 4.0000 g | Freq: Four times a day (QID) | CUTANEOUS | 4 refills | Status: AC

## 2023-06-24 NOTE — Patient Instructions (Addendum)
VISIT SUMMARY:  During your visit, we discussed your ongoing left Achilles tendonitis, your diabetes, and your cardiomyopathy. Your diabetes and heart condition are stable and will continue to be managed as they currently are. Your Achilles tendonitis, however, requires some changes in management.  YOUR PLAN:  -LEFT ACHILLES TENDONITIS: Achilles tendonitis is inflammation of the Achilles tendon, which connects your calf muscles to your heel bone. To help manage your pain and reduce inflammation, we will prescribe Diclofenac gel for you to apply topically. We also recommend that you use a walking boot for all non-work related walking to reduce strain on the tendon. Additionally, we advise you to start physical therapy at [facility]. We will follow up in 6 weeks to see how you're progressing. If there's no improvement, we may consider an MRI.    INSTRUCTIONS:  Please start using the Diclofenac gel as prescribed for your Achilles tendonitis. Use a walking boot for all non-work related walking and let me know how we can accommodate it at work. Start physical therapy at [facility]. We will see you again in 6 weeks to check on your progress.   Achilles Tendinitis  with Rehab Achilles tendinitis is a disorder of the Achilles tendon. The Achilles tendon connects the large calf muscles (Gastrocnemius and Soleus) to the heel bone (calcaneus). This tendon is sometimes called the heel cord. It is important for pushing-off and standing on your toes and is important for walking, running, or jumping. Tendinitis is often caused by overuse and repetitive microtrauma. SYMPTOMS Pain, tenderness, swelling, warmth, and redness may occur over the Achilles tendon even at rest. Pain with pushing off, or flexing or extending the ankle. Pain that is worsened after or during activity. CAUSES  Overuse sometimes seen with rapid increase in exercise programs or in sports requiring running and  jumping. Poor physical conditioning (strength and flexibility or endurance). Running sports, especially training running down hills. Inadequate warm-up before practice or play or failure to stretch before participation. Injury to the tendon. PREVENTION  Warm up and stretch before practice or competition. Allow time for adequate rest and recovery between practices and competition. Keep up conditioning. Keep up ankle and leg flexibility. Improve or keep muscle strength and endurance. Improve cardiovascular fitness. Use proper technique. Use proper equipment (shoes, skates). To help prevent recurrence, taping, protective strapping, or an adhesive bandage may be recommended for several weeks after healing is complete. PROGNOSIS  Recovery may take weeks to several months to heal. Longer recovery is expected if symptoms have been prolonged. Recovery is usually quicker if the inflammation is due to a direct blow as compared with overuse or sudden strain. RELATED COMPLICATIONS  Healing time will be prolonged if the condition is not correctly treated. The injury must be given plenty of time to heal. Symptoms can reoccur if activity is resumed too soon. Untreated, tendinitis may increase the risk of tendon rupture requiring additional time for recovery and possibly surgery. TREATMENT  The first treatment consists of rest anti-inflammatory medication, and ice to relieve the pain. Stretching and strengthening exercises after resolution of pain will likely help reduce the risk of recurrence. Referral to a physical therapist or athletic trainer for further evaluation and treatment may be helpful. A walking boot or cast may be recommended to rest the Achilles tendon. This can help break the cycle of inflammation and microtrauma. Arch supports (orthotics) may be prescribed or recommended by your caregiver as an adjunct to therapy and rest. Surgery to  remove the inflamed tendon lining or degenerated  tendon tissue is rarely necessary and has shown less than predictable results. MEDICATION  Nonsteroidal anti-inflammatory medications, such as aspirin and ibuprofen, may be used for pain and inflammation relief. Do not take within 7 days before surgery. Take these as directed by your caregiver. Contact your caregiver immediately if any bleeding, stomach upset, or signs of allergic reaction occur. Other minor pain relievers, such as acetaminophen, may also be used. Pain relievers may be prescribed as necessary by your caregiver. Do not take prescription pain medication for longer than 4 to 7 days. Use only as directed and only as much as you need. Cortisone injections are rarely indicated. Cortisone injections may weaken tendons and predispose to rupture. It is better to give the condition more time to heal than to use them. HEAT AND COLD Cold is used to relieve pain and reduce inflammation for acute and chronic Achilles tendinitis. Cold should be applied for 10 to 15 minutes every 2 to 3 hours for inflammation and pain and immediately after any activity that aggravates your symptoms. Use ice packs or an ice massage. Heat may be used before performing stretching and strengthening activities prescribed by your caregiver. Use a heat pack or a warm soak. SEEK MEDICAL CARE IF: Symptoms get worse or do not improve in 2 weeks despite treatment. New, unexplained symptoms develop. Drugs used in treatment may produce side effects.  EXERCISES:  RANGE OF MOTION (ROM) AND STRETCHING EXERCISES - Achilles Tendinitis  These exercises may help you when beginning to rehabilitate your injury. Your symptoms may resolve with or without further involvement from your physician, physical therapist or athletic trainer. While completing these exercises, remember:  Restoring tissue flexibility helps normal motion to return to the joints. This allows healthier, less painful movement and activity. An effective stretch should be  held for at least 30 seconds. A stretch should never be painful. You should only feel a gentle lengthening or release in the stretched tissue.  STRETCH  Gastroc, Standing  Place hands on wall. Extend right / left leg, keeping the front knee somewhat bent. Slightly point your toes inward on your back foot. Keeping your right / left heel on the floor and your knee straight, shift your weight toward the wall, not allowing your back to arch. You should feel a gentle stretch in the right / left calf. Hold this position for 10 seconds. Repeat 3 times. Complete this stretch 2 times per day.  STRETCH  Soleus, Standing  Place hands on wall. Extend right / left leg, keeping the other knee somewhat bent. Slightly point your toes inward on your back foot. Keep your right / left heel on the floor, bend your back knee, and slightly shift your weight over the back leg so that you feel a gentle stretch deep in your back calf. Hold this position for 10 seconds. Repeat 3 times. Complete this stretch 2 times per day.  STRETCH  Gastrocsoleus, Standing  Note: This exercise can place a lot of stress on your foot and ankle. Please complete this exercise only if specifically instructed by your caregiver.  Place the ball of your right / left foot on a step, keeping your other foot firmly on the same step. Hold on to the wall or a rail for balance. Slowly lift your other foot, allowing your body weight to press your heel down over the edge of the step. You should feel a stretch in your right / left calf. Hold  this position for 10 seconds. Repeat this exercise with a slight bend in your knee. Repeat 3 times. Complete this stretch 2 times per day.   STRENGTHENING EXERCISES - Achilles Tendinitis These exercises may help you when beginning to rehabilitate your injury. They may resolve your symptoms with or without further involvement from your physician, physical therapist or athletic trainer. While completing these  exercises, remember:  Muscles can gain both the endurance and the strength needed for everyday activities through controlled exercises. Complete these exercises as instructed by your physician, physical therapist or athletic trainer. Progress the resistance and repetitions only as guided. You may experience muscle soreness or fatigue, but the pain or discomfort you are trying to eliminate should never worsen during these exercises. If this pain does worsen, stop and make certain you are following the directions exactly. If the pain is still present after adjustments, discontinue the exercise until you can discuss the trouble with your clinician.  STRENGTH - Plantar-flexors  Sit with your right / left leg extended. Holding onto both ends of a rubber exercise band/tubing, loop it around the ball of your foot. Keep a slight tension in the band. Slowly push your toes away from you, pointing them downward. Hold this position for 10 seconds. Return slowly, controlling the tension in the band/tubing. Repeat 3 times. Complete this exercise 2 times per day.   STRENGTH - Plantar-flexors  Stand with your feet shoulder width apart. Steady yourself with a wall or table using as little support as needed. Keeping your weight evenly spread over the width of your feet, rise up on your toes.* Hold this position for 10 seconds. Repeat 3 times. Complete this exercise 2 times per day.  *If this is too easy, shift your weight toward your right / left leg until you feel challenged. Ultimately, you may be asked to do this exercise with your right / left foot only.  STRENGTH  Plantar-flexors, Eccentric  Note: This exercise can place a lot of stress on your foot and ankle. Please complete this exercise only if specifically instructed by your caregiver.  Place the balls of your feet on a step. With your hands, use only enough support from a wall or rail to keep your balance. Keep your knees straight and rise up on your  toes. Slowly shift your weight entirely to your right / left toes and pick up your opposite foot. Gently and with controlled movement, lower your weight through your right / left foot so that your heel drops below the level of the step. You will feel a slight stretch in the back of your calf at the end position. Use the healthy leg to help rise up onto the balls of both feet, then lower weight only on the right / left leg again. Build up to 15 repetitions. Then progress to 3 consecutive sets of 15 repetitions.* After completing the above exercise, complete the same exercise with a slight knee bend (about 30 degrees). Again, build up to 15 repetitions. Then progress to 3 consecutive sets of 15 repetitions.* Perform this exercise 2 times per day.  *When you easily complete 3 sets of 15, your physician, physical therapist or athletic trainer may advise you to add resistance by wearing a backpack filled with additional weight.  STRENGTH - Plantar Flexors, Seated  Sit on a chair that allows your feet to rest flat on the ground. If necessary, sit at the edge of the chair. Keeping your toes firmly on the ground, lift your  right / left heel as far as you can without increasing any discomfort in your ankle. Repeat 3 times. Complete this exercise 2 times a day.

## 2023-06-24 NOTE — Progress Notes (Signed)
  Subjective:  Patient ID: Lindsay Hayden, female    DOB: 1960/10/06,  MRN: [id number]  Chief Complaint  Patient presents with   Foot Pain    new pt-L achilles tendinitis pain    Discussed the use of AI scribe software for clinical note transcription with the patient, who gave verbal consent to proceed.  History of Present Illness   The patient, with a history of right foot surgery for heel spurs, plantar fasciitis, and Achilles tendon damage in 2012, presents with left Achilles tendonitis. She reports a history of diabetes and heart issues, including cardiomyopathy, which are currently stable. She works in a [employer], which involves significant walking.  The left Achilles tendonitis has been ongoing for a while, but the patient has been managing the discomfort with over-the-counter pain relievers such as Aleve and Tylenol. The pain is located in the mid-substance of the tendon and where it inserts on the heel bone, with the latter being more painful. The patient denies any pain in the plantar fascia.  The patient's right foot, which had surgery in 2012, has recovered well with no current issues. The surgery was for heel spurs, plantar fasciitis, and Achilles tendon damage. The recovery period was a couple of months, and the patient was able to return to work about 3 months later.          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 Mild tenderness at insertion on heel bone. +2 DP and PT pulse. Most pain at the insertion of left Achilles and some in mid substance. Gastrocnemius equinus is noted       No images are attached to the encounter.    Results   RADIOLOGY X-ray: Calcaneal spurs, Haglund deformity, no fracture or stress fracture      Assessment:   1. Achilles tendinitis of left lower extremity   2. Gastrocnemius equinus of left lower extremity      Plan:  Patient was evaluated and treated and all  questions answered.  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 Despite self-managing with Aleve and Tylenol, we will prescribe Diclofenac gel for topical pain relief and inflammation reduction. I recommended avoiding prescription dosed NSAIDs and steroids due to her cardiac history and diabetes. A walking boot is recommended for all non-work related walking to reduce strain on the tendon, I advised her to discuss with her work what accommodations can be made to use the boot while working as well, or if not feasible to allow her to have light duty or seated work; this was dispensed today to allow soft tissue rest and healing, and initiation of physical therapy at [facility] is advised; a referral was sent. A follow-up in 6 weeks is scheduled to assess progress, with consideration of an MRI if there is no improvement.          Return in about 6 weeks (around 08/05/2023) for re-check Achilles tendon.

## 2023-06-28 ENCOUNTER — Other Ambulatory Visit: Payer: Self-pay | Admitting: Cardiology

## 2023-06-28 DIAGNOSIS — I5042 Chronic combined systolic (congestive) and diastolic (congestive) heart failure: Secondary | ICD-10-CM

## 2023-07-04 NOTE — Therapy (Signed)
OUTPATIENT PHYSICAL THERAPY LOWER EXTREMITY EVALUATION   Patient Name: Lindsay Hayden MRN: [id number] DOB:1961/01/02, 62 y.o., female Today's Date: 07/08/2023   END OF SESSION:  PT End of Session - 07/08/23 0846     Visit Number 1    Date for PT Re-Evaluation 09/02/23    Authorization Type [employer] APWU    PT Start Time 0846    PT Stop Time 0935    PT Time Calculation (min) 49 min    Activity Tolerance Patient tolerated treatment well    Behavior During Therapy [facility] for tasks assessed/performed             Past Medical History:  Diagnosis Date   Anemia    Arthritis    Asthma    Cardiomyopathy (HCC)    EF 45% on echo 03/2021   Chronic combined systolic and diastolic CHF (congestive heart failure) (HCC)    Diabetes mellitus without complication (HCC)    Hypercalcemia    Hyperlipidemia    Hypertension    Hypothyroid    LBBB (left bundle branch block)    LVH (left ventricular hypertrophy)    NICM (nonischemic cardiomyopathy) (HCC)    OSA (obstructive sleep apnea)    Overweight    Prediabetes    Shortness of breath    Sleep apnea    Past Surgical History:  Procedure Laterality Date   ACHILLES TENDON SURGERY     right   BREAST BIOPSY Right 02/13/2023   MM RT BREAST BX W LOC DEV 1ST LESION IMAGE BX SPEC STEREO GUIDE 02/13/2023 [facility]   CESAREAN SECTION     x2   KNEE ARTHROSCOPY WITH MENISCAL REPAIR     right knee   OVARIAN CYST REMOVAL     right   Patient Active Problem List   Diagnosis Date Noted   DOE (dyspnea on exertion) 02/25/2023   Chronic combined systolic and diastolic CHF (congestive heart failure) (HCC) 11/08/2020   Hypercholesterolemia 09/11/2020   Hypothyroidism 09/11/2020   Mild persistent asthma, uncomplicated 09/11/2020   Osteoarthritis of knee 09/11/2020   Sciatica 09/11/2020   Hyperglycemia 09/11/2020   Hyperlipidemia associated with type 2 diabetes mellitus (HCC) 08/29/2020   Vitamin D deficiency 08/29/2020   Combined  systolic and diastolic congestive heart failure (HCC) 08/09/2020   Diabetes mellitus (HCC) 08/09/2020   DCM (dilated cardiomyopathy) (HCC) 02/15/2019   LBBB (left bundle branch block) 02/15/2019   Hypertension 02/15/2019   Obstructive sleep apnea syndrome 02/15/2019   Class 2 severe obesity with serious comorbidity and body mass index (BMI) of 35.0 to 35.9 in adult (HCC) 02/15/2019    PCP: Sigmund Hazel, MD   REFERRING PROVIDER: Edwin Cap, DPM   REFERRING DIAG: [phone] (ICD-10-CM) - Achilles tendinitis of right lower extremity   THERAPY DIAG:  Pain in left ankle and joints of left foot  Muscle weakness (generalized)  Other abnormalities of gait and mobility  RATIONALE FOR EVALUATION AND TREATMENT: Rehabilitation  ONSET DATE: 1+ yr, worsening as of September 2024  NEXT MD VISIT: 08/05/2023   SUBJECTIVE:  SUBJECTIVE STATEMENT: Pt reports her L ankle has been bothering her for a while (< 1 yr) but manageable with ace wrapping, ice and pain meds. Went on vacation in Sept and did a lot of walking with the pain steadily worsening. Saw MD and she was placed in a CAM walking boot for all walking and has to add a steel toe covering while at work.  She will follow-up with the referring MD in November and hopes to avoid surgery (she has had a similar surgery for her right ankle in the past).  PAIN: Are you having pain? Yes: NPRS scale: 5-6/10 Pain location: posterior medial L heel Pain description: burning Aggravating factors: walking, stairs Relieving factors: Voltaren, Aleve, ace wrapping  PERTINENT HISTORY:  history of R foot surgery for heel spurs, plantar fasciitis, and Achilles tendon damage in 04/2011; DM; arthritis; asthma; cardiomyopathy; HTN; hypothyroidism; OSA; obesity; R  sciatica  PRECAUTIONS: Other: walking boot is recommended for all walking to reduce strain on the tendon to allow soft tissue rest and healing     RED FLAGS: None  WEIGHT BEARING RESTRICTIONS: Yes WBAT in CAM boot for all walking  FALLS:  Has patient fallen in last 6 months? 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 - mixed sitting (60%) > standing (~40%); full standing for all overtime work  PLOF: Independent and Leisure: watching TV, walking 20-30 min - 4 days/wk (had to stop due to L foot pain)   PATIENT GOALS: "Be able to walk w/o pain."   OBJECTIVE: (objective measures completed at initial evaluation unless otherwise dated)  DIAGNOSTIC FINDINGS:  06/24/23 - R foot x-ray: Calcaneal spurs, Haglund deformity, no fracture or stress fracture   PATIENT SURVEYS:  LEFS 41 / 80 = 51.2 %  COGNITION: Overall cognitive status: Within functional limits for tasks assessed    SENSATION: WFL Intermittent numbness in L 2nd toe  EDEMA:  Figure 8: R = 54.0 cm, L = 54.5 cm (has not been on her feet much yet today and was off work yesterday)  MUSCLE LENGTH: Hamstrings:  ITB:  Piriformis:  Hip flexors: mild/mod tight R>L Quads:  Heelcord: mod tight B  POSTURE:  L>R equinovalgus  PALPATION: TTP over Achilles tendon and medial ankle  LOWER EXTREMITY ROM:  Active ROM Right eval Left eval  Ankle dorsiflexion 4 -10  Ankle plantarflexion 42 41  Ankle inversion 34 22  Ankle eversion 17 12   Passive ROM Left eval  Ankle dorsiflexion 4  Ankle plantarflexion   Ankle inversion 30  Ankle eversion 24  (Blank rows = not tested)  LOWER EXTREMITY MMT:  MMT Right eval Left eval  Hip  flexion 4+ (pain in R hip) 4-  Hip extension 4- 3+  Hip abduction 4- 4-  Hip adduction 4- 4  Hip internal rotation 3+ 4-  Hip external rotation 4- (pain in R hip) 4-  Knee flexion 4 4-  Knee extension 5 4+  Ankle dorsiflexion 5 4-  Ankle plantarflexion    Ankle inversion 5 4  Ankle eversion 5 3+   (Blank rows = not tested)  FUNCTIONAL TESTS:  SLS: R = 13.63 sec, L = 1.69 sec   GAIT: Distance walked: clinic distances Assistive device utilized:  CAM walking boot and None Level of assistance: Complete Independence Gait pattern: decreased stance time- Left, Right hip hike, and antalgic   TODAY'S TREATMENT:   07/08/23 - Eval THERAPEUTIC EXERCISE: to improve flexibility, strength and mobility.  Demonstration, verbal and tactile cues throughout for technique.  Long sitting L gastroc stretch x 30" Long sitting L soleus stretch x 30" Long sitting L ankle pumps x 20 Long sitting L ankle circles CW/CCW x 10 each   PATIENT EDUCATION:  Education details: PT eval findings, anticipated POC, initial HEP, and info on "EVENup"style shoe lift to reduce sciatica irritation from walking in CAM boot   Person educated: Patient Education method: Explanation, Demonstration, Verbal cues, and Handouts Education comprehension: verbalized understanding, returned demonstration, verbal cues required, and needs further education  HOME EXERCISE PROGRAM: Access Code: [email or web address] URL: [email or web address] Date: 07/08/2023 Prepared by: Glenetta Hew  Exercises - Long Sitting Calf Stretch with Strap  - 2 x daily - 7 x weekly - 3 reps - 30 sec hold - Long Sitting Soleus Stretch on Bolster with Strap (Mirrored)  - 2 x daily - 7 x weekly - 3 reps - 30 sec hold - Supine Single Leg Ankle Pumps (Mirrored)  - 2 x daily - 7 x weekly - 2 sets - 10 reps - 3 sec hold - Supine Ankle Circles  - 2 x daily - 7 x weekly - 2 sets - 10 reps   ASSESSMENT:  CLINICAL IMPRESSION: Lindsay Hayden is a 62  y.o. female who was referred to physical therapy for evaluation and treatment for L Achilles tendinitis and equinus.  She reports pain is chronic in nature, originating >1 year ago, but exacerbated by extensive walking while on vacation in September.  Current deficits noted L posterior medial ankle pain, antalgic gait, decreased L ankle AROM, L>R heel cord tightness, as well as L ankle and B proximal LE weakness.  Pain limits her activity tolerance for standing while at work as well as prevents her from walking for exercise.  Lindsay Hayden will benefit from skilled PT to address above deficits to improve mobility and activity tolerance with decreased pain interference.   OBJECTIVE IMPAIRMENTS: Abnormal gait, decreased activity tolerance, decreased balance, decreased coordination, decreased endurance, decreased knowledge of condition, decreased mobility, difficulty walking, decreased ROM, decreased strength, hypomobility, increased edema, increased fascial restrictions, impaired perceived functional ability, increased muscle spasms, impaired flexibility, postural dysfunction, and pain.   ACTIVITY LIMITATIONS: carrying, lifting, standing, squatting, stairs, bed mobility, and locomotion level  PARTICIPATION LIMITATIONS: meal prep, cleaning, laundry, shopping, community activity, and occupation  PERSONAL FACTORS: Fitness, Past/current experiences, Time since onset of injury/illness/exacerbation, and 3+ comorbidities: history of R foot surgery for heel spurs, plantar fasciitis, and Achilles tendon damage in 04/2011; DM; arthritis; asthma; cardiomyopathy; HTN; hypothyroidism; OSA; obesity; R sciatica  are also affecting patient's functional outcome.   REHAB POTENTIAL: Good  CLINICAL DECISION MAKING: Evolving/moderate complexity  EVALUATION COMPLEXITY: Moderate   GOALS: Goals reviewed with patient? Yes  SHORT TERM GOALS: Target date: 08/05/2023  Patient will be independent with initial HEP. Baseline:   Goal status: INITIAL  2.  Patient will report at least 25% improvement in L ankle/foot pain to improve QOL. Baseline: 5-6/10 Goal status: INITIAL  3.  Patient will improve L ankle DF to at least neutral to promote more normal gait pattern. Baseline: -10 Goal status: INITIAL   LONG TERM GOALS: Target date: 09/02/2023   Patient will be independent with advanced/ongoing HEP to improve outcomes and carryover.  Baseline:  Goal status: INITIAL  2.  Patient will report at least 50-75% improvement in L ankle/foot pain to improve QOL. Baseline: 5-6/10 Goal status: INITIAL  3.  Patient will demonstrate improved L ankle AROM to [facility] to allow for normal gait and stair mechanics. Baseline: Refer to above LE ROM table Goal status: INITIAL  4.  Patient will demonstrate improved B LE strength to >/= 4+/5 for improved stability and ease of mobility. Baseline: Refer to above LE MMT table Goal status: INITIAL  5.  Patient will be able to ambulate with normal gait pattern without CAM boot or increased foot/ankle pain to access community.  Baseline: Antalgic gait in CAM boot Goal status: INITIAL  6. Patient will be able to ascend/descend stairs with 1 HR and reciprocal step pattern safely to access home and community.  Baseline: NT Goal status: INITIAL  7.  Patient will report >/= 50/80 on LEFS to demonstrate improved functional ability. Baseline: 41 / 80 = 51.2 % Goal status: INITIAL  8.  Patient will demonstrate ability to maintain L SLS for at least 10 sec w/o support to demonstrate improved ankle stability. Baseline: SLS: R = 13.63 sec, L = 1.69 sec  Goal status: INITIAL    PLAN:  PT FREQUENCY: 2x/week  PT DURATION: 8 weeks  PLANNED INTERVENTIONS: 97164- PT Re-evaluation, 97110-Therapeutic exercises, 97530- Therapeutic activity, [id number]- Neuromuscular re-education, 97535- Self Care, [id number]- Manual therapy, [id number]- Gait training, [phone]- Orthotic Fit/training, [phone]- Aquatic Therapy,  97014- Electrical stimulation (unattended), [phone]- Electrical stimulation (manual), 97016- Vasopneumatic device, [id number]- Ultrasound, [id number]- Ionotophoresis 4mg /ml Dexamethasone, Patient/Family education, Balance training, Stair training, Taping, Dry Needling, Joint mobilization, DME instructions, Cryotherapy, and Moist heat  PLAN FOR NEXT SESSION: Review initial HEP; gently progress L ankle ROM/flexibility; initiate pain-free ankle strengthening - probable isometrics, progressing to Thera-Band as tolerated; proximal LE strengthening; update HEP accordingly; MT +/- DN and modalities for pain management as needed   Marry Guan, PT 07/08/2023, 10:51 AM

## 2023-07-08 ENCOUNTER — Other Ambulatory Visit: Payer: Self-pay

## 2023-07-08 ENCOUNTER — Encounter: Payer: Self-pay | Admitting: Physical Therapy

## 2023-07-08 ENCOUNTER — Ambulatory Visit: Payer: 59 | Attending: Podiatry | Admitting: Physical Therapy

## 2023-07-08 DIAGNOSIS — M25572 Pain in left ankle and joints of left foot: Secondary | ICD-10-CM | POA: Diagnosis present

## 2023-07-08 DIAGNOSIS — M6281 Muscle weakness (generalized): Secondary | ICD-10-CM | POA: Diagnosis present

## 2023-07-08 DIAGNOSIS — M7662 Achilles tendinitis, left leg: Secondary | ICD-10-CM | POA: Insufficient documentation

## 2023-07-08 DIAGNOSIS — R2689 Other abnormalities of gait and mobility: Secondary | ICD-10-CM | POA: Diagnosis present

## 2023-07-15 ENCOUNTER — Ambulatory Visit: Payer: 59

## 2023-07-15 DIAGNOSIS — M25572 Pain in left ankle and joints of left foot: Secondary | ICD-10-CM | POA: Diagnosis not present

## 2023-07-15 DIAGNOSIS — M6281 Muscle weakness (generalized): Secondary | ICD-10-CM

## 2023-07-15 DIAGNOSIS — R2689 Other abnormalities of gait and mobility: Secondary | ICD-10-CM

## 2023-07-15 NOTE — Therapy (Signed)
OUTPATIENT PHYSICAL THERAPY LOWER EXTREMITY TREATMENT   Patient Name: Lindsay Hayden MRN: [id number] DOB:06-19-1961, 62 y.o., female Today's Date: 07/15/2023   END OF SESSION:  PT End of Session - 07/15/23 1150     Visit Number 2    Date for PT Re-Evaluation 09/02/23    Authorization Type [employer] APWU    PT Start Time 1103    PT Stop Time 1146    PT Time Calculation (min) 43 min    Activity Tolerance Patient tolerated treatment well    Behavior During Therapy WFL for tasks assessed/performed              Past Medical History:  Diagnosis Date   Anemia    Arthritis    Asthma    Cardiomyopathy (HCC)    EF 45% on echo 03/2021   Chronic combined systolic and diastolic CHF (congestive heart failure) (HCC)    Diabetes mellitus without complication (HCC)    Hypercalcemia    Hyperlipidemia    Hypertension    Hypothyroid    LBBB (left bundle branch block)    LVH (left ventricular hypertrophy)    NICM (nonischemic cardiomyopathy) (HCC)    OSA (obstructive sleep apnea)    Overweight    Prediabetes    Shortness of breath    Sleep apnea    Past Surgical History:  Procedure Laterality Date   ACHILLES TENDON SURGERY     right   BREAST BIOPSY Right 02/13/2023   MM RT BREAST BX W LOC DEV 1ST LESION IMAGE BX SPEC STEREO GUIDE 02/13/2023 [facility]   CESAREAN SECTION     x2   KNEE ARTHROSCOPY WITH MENISCAL REPAIR     right knee   OVARIAN CYST REMOVAL     right   Patient Active Problem List   Diagnosis Date Noted   DOE (dyspnea on exertion) 02/25/2023   Chronic combined systolic and diastolic CHF (congestive heart failure) (HCC) 11/08/2020   Hypercholesterolemia 09/11/2020   Hypothyroidism 09/11/2020   Mild persistent asthma, uncomplicated 09/11/2020   Osteoarthritis of knee 09/11/2020   Sciatica 09/11/2020   Hyperglycemia 09/11/2020   Hyperlipidemia associated with type 2 diabetes mellitus (HCC) 08/29/2020   Vitamin D deficiency 08/29/2020   Combined  systolic and diastolic congestive heart failure (HCC) 08/09/2020   Diabetes mellitus (HCC) 08/09/2020   DCM (dilated cardiomyopathy) (HCC) 02/15/2019   LBBB (left bundle branch block) 02/15/2019   Hypertension 02/15/2019   Obstructive sleep apnea syndrome 02/15/2019   Class 2 severe obesity with serious comorbidity and body mass index (BMI) of 35.0 to 35.9 in adult (HCC) 02/15/2019    PCP: Sigmund Hazel, MD   REFERRING PROVIDER: Edwin Cap, DPM   REFERRING DIAG: [phone] (ICD-10-CM) - Achilles tendinitis of right lower extremity   THERAPY DIAG:  Pain in left ankle and joints of left foot  Muscle weakness (generalized)  Other abnormalities of gait and mobility  RATIONALE FOR EVALUATION AND TREATMENT: Rehabilitation  ONSET DATE: 1+ yr, worsening as of September 2024  NEXT MD VISIT: 08/05/2023   SUBJECTIVE:  SUBJECTIVE STATEMENT: Pain a little better today. Now having pain in L posterior knee. Looked into Even Up but unsure if she would be able to wear during work.   PAIN: Are you having pain? Yes: NPRS scale: 5/10 Pain location: posterior medial L heel Pain description: burning Aggravating factors: walking, stairs Relieving factors: Voltaren, Aleve, ace wrapping  PERTINENT HISTORY:  history of R foot surgery for heel spurs, plantar fasciitis, and Achilles tendon damage in 04/2011; DM; arthritis; asthma; cardiomyopathy; HTN; hypothyroidism; OSA; obesity; R sciatica  PRECAUTIONS: Other: walking boot is recommended for all walking to reduce strain on the tendon to allow soft tissue rest and healing     RED FLAGS: None  WEIGHT BEARING RESTRICTIONS: Yes WBAT in CAM boot for all walking  FALLS:  Has patient fallen in last 6 months? 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 - mixed sitting (60%) > standing (~40%); full standing for all overtime work  PLOF: Independent and Leisure: watching TV, walking 20-30 min - 4 days/wk (had to stop due to L foot pain)   PATIENT GOALS: "Be able to walk w/o pain."   OBJECTIVE: (objective measures completed at initial evaluation unless otherwise dated)  DIAGNOSTIC FINDINGS:  06/24/23 - R foot x-ray: Calcaneal spurs, Haglund deformity, no fracture or stress fracture   PATIENT SURVEYS:  LEFS 41 / 80 = 51.2 %  COGNITION: Overall cognitive status: Within functional limits for tasks assessed    SENSATION: WFL Intermittent numbness in L 2nd toe  EDEMA:  Figure 8: R = 54.0 cm, L = 54.5 cm (has not been on her feet much yet today and was off work yesterday)  MUSCLE LENGTH: Hamstrings:  ITB:  Piriformis:  Hip flexors: mild/mod tight R>L Quads:  Heelcord: mod tight B  POSTURE:  L>R equinovalgus  PALPATION: TTP over Achilles tendon and medial ankle  LOWER EXTREMITY ROM:  Active ROM Right eval Left eval  Ankle dorsiflexion 4 -10  Ankle plantarflexion 42 41  Ankle inversion 34 22  Ankle eversion 17 12   Passive ROM Left eval  Ankle dorsiflexion 4  Ankle plantarflexion   Ankle inversion 30  Ankle eversion 24  (Blank rows = not tested)  LOWER EXTREMITY MMT:  MMT Right eval Left eval  Hip flexion 4+ (pain in R hip) 4-  Hip extension 4- 3+  Hip abduction 4- 4-  Hip adduction 4- 4  Hip internal rotation 3+ 4-  Hip external rotation 4- (pain in R hip) 4-  Knee flexion 4 4-  Knee extension 5 4+  Ankle dorsiflexion 5 4-  Ankle plantarflexion    Ankle inversion 5 4  Ankle eversion 5 3+   (Blank rows = not  tested)  FUNCTIONAL TESTS:  SLS: R = 13.63 sec, L = 1.69 sec   GAIT: Distance walked: clinic distances Assistive device utilized:  CAM walking boot and None Level of assistance: Complete Independence Gait pattern: decreased stance time- Left, Right hip hike, and antalgic   TODAY'S TREATMENT:  07/15/23 THERAPEUTIC EXERCISE: to improve flexibility, strength and mobility.  Demonstration, verbal and tactile cues throughout for technique.  Nustep L3x45min Long sitting L gastroc stretch x 30" Long sitting L soleus stretch x 30" L hamstring stretch  seated 2x30" Seated heel/toe raises x 10 bil BAPS board L ankle: PF/DF, CW/CCW circles, EV/IV x 10 each Manual Therapy: to decrease muscle spasm, pain and improve mobility.  STM to L medial hamstrings  07/08/23 - Eval THERAPEUTIC EXERCISE: to improve flexibility, strength and mobility.  Demonstration, verbal and tactile cues throughout for technique.  Long sitting L gastroc stretch x 30" Long sitting L soleus stretch x 30" Long sitting L ankle pumps x 20 Long sitting L ankle circles CW/CCW x 10 each   PATIENT EDUCATION:  Education details: PT eval findings, anticipated POC, initial HEP, and info on "EVENup"style shoe lift to reduce sciatica irritation from walking in CAM boot   Person educated: Patient Education method: Explanation, Demonstration, Verbal cues, and Handouts Education comprehension: verbalized understanding, returned demonstration, verbal cues required, and needs further education  HOME EXERCISE PROGRAM: Access Code: [email or web address] URL: [email or web address] Date: 07/08/2023 Prepared by: Glenetta Hew  Exercises - Long Sitting Calf Stretch with Strap  - 2 x daily - 7 x weekly - 3 reps - 30 sec hold - Long Sitting Soleus Stretch on Bolster with Strap (Mirrored)  - 2 x daily - 7 x weekly - 3 reps - 30 sec hold - Supine Single Leg Ankle Pumps (Mirrored)  - 2 x daily - 7 x weekly - 2 sets - 10 reps - 3 sec hold -  Supine Ankle Circles  - 2 x daily - 7 x weekly - 2 sets - 10 reps   ASSESSMENT:  CLINICAL IMPRESSION: Lindsay Hayden is a 62 y.o. female who was seen for physical therapy treatment for L Achilles tendinitis and equinus.  Continued with progression of exercises as tolerated. Pt reports some pain in L HS, this was addressed with MT and HS stretching. Overall good tolerance for therex with cues provided as needed for correct form.  Lindsay Hayden will benefit from skilled PT to address above deficits to improve mobility and activity tolerance with decreased pain interference.   OBJECTIVE IMPAIRMENTS: Abnormal gait, decreased activity tolerance, decreased balance, decreased coordination, decreased endurance, decreased knowledge of condition, decreased mobility, difficulty walking, decreased ROM, decreased strength, hypomobility, increased edema, increased fascial restrictions, impaired perceived functional ability, increased muscle spasms, impaired flexibility, postural dysfunction, and pain.   ACTIVITY LIMITATIONS: carrying, lifting, standing, squatting, stairs, bed mobility, and locomotion level  PARTICIPATION LIMITATIONS: meal prep, cleaning, laundry, shopping, community activity, and occupation  PERSONAL FACTORS: Fitness, Past/current experiences, Time since onset of injury/illness/exacerbation, and 3+ comorbidities: history of R foot surgery for heel spurs, plantar fasciitis, and Achilles tendon damage in 04/2011; DM; arthritis; asthma; cardiomyopathy; HTN; hypothyroidism; OSA; obesity; R sciatica  are also affecting patient's functional outcome.   REHAB POTENTIAL: Good  CLINICAL DECISION MAKING: Evolving/moderate complexity  EVALUATION COMPLEXITY: Moderate   GOALS: Goals reviewed with patient? Yes  SHORT TERM GOALS: Target date: 08/05/2023  Patient will be independent with initial HEP. Baseline:  Goal status: IN PROGRESS  2.  Patient will report at least 25% improvement in L  ankle/foot pain to improve QOL. Baseline: 5-6/10 Goal status: IN PROGRESS  3.  Patient will improve L ankle DF to at least neutral to promote more normal gait pattern. Baseline: -10 Goal status: IN PROGRESS   LONG TERM GOALS: Target date: 09/02/2023   Patient will be independent with advanced/ongoing HEP to improve outcomes and carryover.  Baseline:  Goal status: IN PROGRESS  2.  Patient will report at least 50-75% improvement in L ankle/foot pain to improve QOL. Baseline: 5-6/10 Goal status: IN  PROGRESS  3.  Patient will demonstrate improved L ankle AROM to [facility] to allow for normal gait and stair mechanics. Baseline: Refer to above LE ROM table Goal status: IN PROGRESS  4.  Patient will demonstrate improved B LE strength to >/= 4+/5 for improved stability and ease of mobility. Baseline: Refer to above LE MMT table Goal status: IN PROGRESS  5.  Patient will be able to ambulate with normal gait pattern without CAM boot or increased foot/ankle pain to access community.  Baseline: Antalgic gait in CAM boot Goal status: IN PROGRESS  6. Patient will be able to ascend/descend stairs with 1 HR and reciprocal step pattern safely to access home and community.  Baseline: NT Goal status: IN PROGRESS  7.  Patient will report >/= 50/80 on LEFS to demonstrate improved functional ability. Baseline: 41 / 80 = 51.2 % Goal status: IN PROGRESS  8.  Patient will demonstrate ability to maintain L SLS for at least 10 sec w/o support to demonstrate improved ankle stability. Baseline: SLS: R = 13.63 sec, L = 1.69 sec  Goal status: IN PROGRESS    PLAN:  PT FREQUENCY: 2x/week  PT DURATION: 8 weeks  PLANNED INTERVENTIONS: 97164- PT Re-evaluation, 97110-Therapeutic exercises, 97530- Therapeutic activity, [id number]- Neuromuscular re-education, 97535- Self Care, 13244- Manual therapy, [id number]- Gait training, [phone]- Orthotic Fit/training, [id number]- Aquatic Therapy, 97014- Electrical stimulation  (unattended), [id number]- Electrical stimulation (manual), [id number]- Vasopneumatic device, [id number]- Ultrasound, [id number]- Ionotophoresis 4mg /ml Dexamethasone, Patient/Family education, Balance training, Stair training, Taping, Dry Needling, Joint mobilization, DME instructions, Cryotherapy, and Moist heat  PLAN FOR NEXT SESSION: Review initial HEP; gently progress L ankle ROM/flexibility; initiate pain-free ankle strengthening - probable isometrics, progressing to Thera-Band as tolerated; proximal LE strengthening; update HEP accordingly; MT +/- DN and modalities for pain management as needed   Darleene Cleaver, PTA 07/15/2023, 12:01 PM

## 2023-07-22 ENCOUNTER — Encounter: Payer: Self-pay | Admitting: Physical Therapy

## 2023-07-22 ENCOUNTER — Ambulatory Visit: Payer: 59 | Attending: Podiatry | Admitting: Physical Therapy

## 2023-07-22 DIAGNOSIS — M6281 Muscle weakness (generalized): Secondary | ICD-10-CM | POA: Diagnosis present

## 2023-07-22 DIAGNOSIS — R2689 Other abnormalities of gait and mobility: Secondary | ICD-10-CM

## 2023-07-22 DIAGNOSIS — M25572 Pain in left ankle and joints of left foot: Secondary | ICD-10-CM | POA: Diagnosis present

## 2023-07-22 NOTE — Therapy (Signed)
OUTPATIENT PHYSICAL THERAPY TREATMENT   Patient Name: Lindsay Hayden MRN: [id number] DOB:Aug 28, 1961, 62 y.o., female Today's Date: 07/22/2023   END OF SESSION:  PT End of Session - 07/22/23 0804     Visit Number 3    Date for PT Re-Evaluation 09/02/23    Authorization Type [employer]    PT Start Time 0804    PT Stop Time 0848    PT Time Calculation (min) 44 min    Activity Tolerance Patient tolerated treatment well    Behavior During Therapy WFL for tasks assessed/performed               Past Medical History:  Diagnosis Date   Anemia    Arthritis    Asthma    Cardiomyopathy (HCC)    EF 45% on echo 03/2021   Chronic combined systolic and diastolic CHF (congestive heart failure) (HCC)    Diabetes mellitus without complication (HCC)    Hypercalcemia    Hyperlipidemia    Hypertension    Hypothyroid    LBBB (left bundle branch block)    LVH (left ventricular hypertrophy)    NICM (nonischemic cardiomyopathy) (HCC)    OSA (obstructive sleep apnea)    Overweight    Prediabetes    Shortness of breath    Sleep apnea    Past Surgical History:  Procedure Laterality Date   ACHILLES TENDON SURGERY     right   BREAST BIOPSY Right 02/13/2023   MM RT BREAST BX W LOC DEV 1ST LESION IMAGE BX SPEC STEREO GUIDE 02/13/2023 [facility]   CESAREAN SECTION     x2   KNEE ARTHROSCOPY WITH MENISCAL REPAIR     right knee   OVARIAN CYST REMOVAL     right   Patient Active Problem List   Diagnosis Date Noted   DOE (dyspnea on exertion) 02/25/2023   Chronic combined systolic and diastolic CHF (congestive heart failure) (HCC) 11/08/2020   Hypercholesterolemia 09/11/2020   Hypothyroidism 09/11/2020   Mild persistent asthma, uncomplicated 09/11/2020   Osteoarthritis of knee 09/11/2020   Sciatica 09/11/2020   Hyperglycemia 09/11/2020   Hyperlipidemia associated with type 2 diabetes mellitus (HCC) 08/29/2020   Vitamin D deficiency 08/29/2020   Combined systolic and  diastolic congestive heart failure (HCC) 08/09/2020   Diabetes mellitus (HCC) 08/09/2020   DCM (dilated cardiomyopathy) (HCC) 02/15/2019   LBBB (left bundle branch block) 02/15/2019   Hypertension 02/15/2019   Obstructive sleep apnea syndrome 02/15/2019   Class 2 severe obesity with serious comorbidity and body mass index (BMI) of 35.0 to 35.9 in adult (HCC) 02/15/2019    PCP: Sigmund Hazel, MD   REFERRING PROVIDER: Edwin Cap, DPM   REFERRING DIAG: [phone] (ICD-10-CM) - Achilles tendinitis of right lower extremity   THERAPY DIAG:  Pain in left ankle and joints of left foot  Muscle weakness (generalized)  Other abnormalities of gait and mobility  RATIONALE FOR EVALUATION AND TREATMENT: Rehabilitation  ONSET DATE: 1+ yr, worsening as of September 2024  NEXT MD VISIT: 08/05/2023   SUBJECTIVE:  SUBJECTIVE STATEMENT: Pt denies pain in her ankle but notes awareness "that it is going to rain".   PAIN: Are you having pain? No and Yes: NPRS scale: 0/10 Pain location: posterior medial L heel Pain description: burning Aggravating factors: walking, stairs Relieving factors: Voltaren, Aleve, ace wrapping  PERTINENT HISTORY:  history of R foot surgery for heel spurs, plantar fasciitis, and Achilles tendon damage in 04/2011; DM; arthritis; asthma; cardiomyopathy; HTN; hypothyroidism; OSA; obesity; R sciatica  PRECAUTIONS: Other: walking boot is recommended for all walking to reduce strain on the tendon to allow soft tissue rest and healing     RED FLAGS: None  WEIGHT BEARING RESTRICTIONS: Yes WBAT in CAM boot for all walking  FALLS:  Has patient fallen in last 6 months? No  LIVING ENVIRONMENT: Lives with: lives with their partner Lives in: House/apartment Stairs: Yes:  Internal: 14 steps; on left going up and External: 3 steps; bilateral but cannot reach both Has following equipment at home: Crutches and CAM walking boot  OCCUPATION: FT - [employer] - responsible for re-wrapping damaged shipping packaging - mixed sitting (60%) > standing (~40%); full standing for all overtime work  PLOF: Independent and Leisure: watching TV, walking 20-30 min - 4 days/wk (had to stop due to L foot pain)   PATIENT GOALS: "Be able to walk w/o pain."   OBJECTIVE: (objective measures completed at initial evaluation unless otherwise dated)  DIAGNOSTIC FINDINGS:  06/24/23 - R foot x-ray: Calcaneal spurs, Haglund deformity, no fracture or stress fracture   PATIENT SURVEYS:  LEFS 41 / 80 = 51.2 %  COGNITION: Overall cognitive status: Within functional limits for tasks assessed    SENSATION: WFL Intermittent numbness in L 2nd toe  EDEMA:  Figure 8: R = 54.0 cm, L = 54.5 cm (has not been on her feet much yet today and was off work yesterday)  MUSCLE LENGTH: Hamstrings:  ITB:  Piriformis:  Hip flexors: mild/mod tight R>L Quads:  Heelcord: mod tight B  POSTURE:  L>R equinovalgus  PALPATION: TTP over Achilles tendon and medial ankle  LOWER EXTREMITY ROM:  Active ROM Right eval Left eval  Ankle dorsiflexion 4 -10  Ankle plantarflexion 42 41  Ankle inversion 34 22  Ankle eversion 17 12   Passive ROM Left eval  Ankle dorsiflexion 4  Ankle plantarflexion   Ankle inversion 30  Ankle eversion 24  (Blank rows = not tested)  LOWER EXTREMITY MMT:  MMT Right eval Left eval  Hip flexion 4+ (pain in R hip) 4-  Hip extension 4- 3+  Hip abduction 4- 4-  Hip adduction 4- 4  Hip internal rotation 3+ 4-  Hip external rotation 4- (pain in R hip) 4-  Knee flexion 4 4-  Knee extension 5 4+  Ankle dorsiflexion 5 4-  Ankle plantarflexion    Ankle inversion 5 4  Ankle eversion 5 3+   (Blank rows = not tested)  FUNCTIONAL TESTS:  SLS: R = 13.63 sec, L = 1.69  sec   GAIT: Distance walked: clinic distances Assistive device utilized:  CAM walking boot and None Level of assistance: Complete Independence Gait pattern: decreased stance time- Left, Right hip hike, and antalgic   TODAY'S TREATMENT:   07/22/23 THERAPEUTIC EXERCISE: to improve flexibility, strength and mobility.  Demonstration, verbal and tactile cues throughout for technique.  Nustep L4 x 6 min Long sitting L gastroc stretch x 30" Long sitting L soleus stretch x 30" Long sitting L ankle pumps x 20 Long sitting  L ankle circles CW/CCW x 10 each Long sitting hip hinge L HS stretch x 30" Seated B ankle inversion isometric ball squeeze 10 x 5" Seated L ankle eversion isometric into ball 10 x 5" Seated L ankle DF isometric into ball 10 x 5" Seated YTB 4-way L ankle x 10 - increased pain reported with PF  Bridge + GTB hip ABD isometric 10 x 5" Hooklying GTB hip ABD/ER clam 10 x 3-5"   07/15/23 THERAPEUTIC EXERCISE: to improve flexibility, strength and mobility.  Demonstration, verbal and tactile cues throughout for technique.  Nustep L3x47min Long sitting L gastroc stretch x 30" Long sitting L soleus stretch x 30" L hamstring stretch seated 2x30" Seated heel/toe raises x 10 bil BAPS board L ankle: PF/DF, CW/CCW circles, EV/IV x 10 each Manual Therapy: to decrease muscle spasm, pain and improve mobility.  STM to L medial hamstrings   07/08/23 - Eval THERAPEUTIC EXERCISE: to improve flexibility, strength and mobility.  Demonstration, verbal and tactile cues throughout for technique.  Long sitting L gastroc stretch x 30" Long sitting L soleus stretch x 30" Long sitting L ankle pumps x 20 Long sitting L ankle circles CW/CCW x 10 each   PATIENT EDUCATION:  Education details: HEP review and HEP update - ankle and proximal LE strengthening   Person educated: Patient Education method: Programmer, multimedia, Demonstration, Verbal cues, and Handouts Education comprehension: verbalized  understanding, returned demonstration, verbal cues required, and needs further education  HOME EXERCISE PROGRAM: Access Code: [email or web address] URL: [email or web address] Date: 07/22/2023 Prepared by: Glenetta Hew  Exercises - Long Sitting Calf Stretch with Strap  - 2 x daily - 7 x weekly - 3 reps - 30 sec hold - Long Sitting Soleus Stretch on Bolster with Strap (Mirrored)  - 2 x daily - 7 x weekly - 3 reps - 30 sec hold - Supine Single Leg Ankle Pumps (Mirrored)  - 2 x daily - 7 x weekly - 2 sets - 10 reps - 3 sec hold - Supine Ankle Circles  - 2 x daily - 7 x weekly - 2 sets - 10 reps - Seated Heel Toe Raises  - 1 x daily - 7 x weekly - 2-3 sets - 10 reps - Seated Ankle Inversion Eversion AROM  - 1 x daily - 7 x weekly - 2-3 sets - 10 reps - Seated Table Hamstring Stretch  - 2 x daily - 7 x weekly - 3 sets - 3 reps - 30 sec hold - Seated Ankle Dorsiflexion with Resistance (Mirrored)  - 1 x daily - 7 x weekly - 2 sets - 10 reps - 3 sec hold - Seated Ankle Inversion with Resistance (Mirrored)  - 1 x daily - 7 x weekly - 2 sets - 10 reps - 3 sec hold - Seated Ankle Eversion with Resistance  - 1 x daily - 7 x weekly - 2 sets - 10 reps - 3 sec hold - Bridge with Resistance  - 1 x daily - 7 x weekly - 2 sets - 10 reps - 5 sec hold - Hooklying Clamshell with Resistance  - 1 x daily - 7 x weekly - 2 sets - 10 reps - 3-5 sec hold   ASSESSMENT:  CLINICAL IMPRESSION: Evonna requested brief review of initial HEP with her main questions being related to newly added L ankle inversion/eversion - pt reporting good understanding and no further questions after review (STG #1 met).  Initiated ankle strengthening, initially with isometrics which were well tolerated  therefore progressed to YTB. Some increased pain reported with YTB ankle PF but otherwise well tolerated, therefore HEP updated to include YTB resisted ankle strengthening for all but resisted PF (pt to continue with seated heel raises).  Introduced proximal LE strengthening to address proximal instability and will progress this in upcoming visits.    Krystelle will benefit from skilled PT to address above deficits to improve mobility and activity tolerance with decreased pain interference.   OBJECTIVE IMPAIRMENTS: Abnormal gait, decreased activity tolerance, decreased balance, decreased coordination, decreased endurance, decreased knowledge of condition, decreased mobility, difficulty walking, decreased ROM, decreased strength, hypomobility, increased edema, increased fascial restrictions, impaired perceived functional ability, increased muscle spasms, impaired flexibility, postural dysfunction, and pain.   ACTIVITY LIMITATIONS: carrying, lifting, standing, squatting, stairs, bed mobility, and locomotion level  PARTICIPATION LIMITATIONS: meal prep, cleaning, laundry, shopping, community activity, and occupation  PERSONAL FACTORS: Fitness, Past/current experiences, Time since onset of injury/illness/exacerbation, and 3+ comorbidities: history of R foot surgery for heel spurs, plantar fasciitis, and Achilles tendon damage in 04/2011; DM; arthritis; asthma; cardiomyopathy; HTN; hypothyroidism; OSA; obesity; R sciatica  are also affecting patient's functional outcome.   REHAB POTENTIAL: Good  CLINICAL DECISION MAKING: Evolving/moderate complexity  EVALUATION COMPLEXITY: Moderate   GOALS: Goals reviewed with patient? Yes  SHORT TERM GOALS: Target date: 08/05/2023  Patient will be independent with initial HEP. Baseline:  Goal status: MET - 07/22/23  2.  Patient will report at least 25% improvement in L ankle/foot pain to improve QOL. Baseline: 5-6/10 Goal status: IN PROGRESS  3.  Patient will improve L ankle DF to at least neutral to promote more normal gait pattern. Baseline: -10 Goal status: IN PROGRESS   LONG TERM GOALS: Target date: 09/02/2023   Patient will be independent with advanced/ongoing HEP to improve  outcomes and carryover.  Baseline:  Goal status: IN PROGRESS  2.  Patient will report at least 50-75% improvement in L ankle/foot pain to improve QOL. Baseline: 5-6/10 Goal status: IN PROGRESS  3.  Patient will demonstrate improved L ankle AROM to [facility] to allow for normal gait and stair mechanics. Baseline: Refer to above LE ROM table Goal status: IN PROGRESS  4.  Patient will demonstrate improved B LE strength to >/= 4+/5 for improved stability and ease of mobility. Baseline: Refer to above LE MMT table Goal status: IN PROGRESS  5.  Patient will be able to ambulate with normal gait pattern without CAM boot or increased foot/ankle pain to access community.  Baseline: Antalgic gait in CAM boot Goal status: IN PROGRESS  6. Patient will be able to ascend/descend stairs with 1 HR and reciprocal step pattern safely to access home and community.  Baseline: NT Goal status: IN PROGRESS  7.  Patient will report >/= 50/80 on LEFS to demonstrate improved functional ability. Baseline: 41 / 80 = 51.2 % Goal status: IN PROGRESS  8.  Patient will demonstrate ability to maintain L SLS for at least 10 sec w/o support to demonstrate improved ankle stability. Baseline: SLS: R = 13.63 sec, L = 1.69 sec  Goal status: IN PROGRESS    PLAN:  PT FREQUENCY: 2x/week  PT DURATION: 8 weeks  PLANNED INTERVENTIONS: 97164- PT Re-evaluation, 97110-Therapeutic exercises, 97530- Therapeutic activity, 97112- Neuromuscular re-education, 97535- Self Care, [id number]- Manual therapy, [phone]- Gait training, [phone]- Orthotic Fit/training, [phone]- Aquatic Therapy, 97014- Electrical stimulation (unattended), [id number]- Electrical stimulation (manual), [id number]- Vasopneumatic device, [id number]- Ultrasound, [id number]- Ionotophoresis 4mg /ml Dexamethasone, Patient/Family education, Balance training, Stair training, Taping, Dry Needling, Joint mobilization,  DME instructions, Cryotherapy, and Moist heat  PLAN FOR NEXT SESSION: gently progress L  ankle ROM/flexibility and pain-free ankle strengthening as tolerated; progress proximal LE strengthening; review/update HEP accordingly; MT +/- DN and modalities for pain management as needed   Marry Guan, PT 07/22/2023, 3:37 PM

## 2023-07-28 ENCOUNTER — Ambulatory Visit: Payer: 59

## 2023-07-28 DIAGNOSIS — R2689 Other abnormalities of gait and mobility: Secondary | ICD-10-CM

## 2023-07-28 DIAGNOSIS — M6281 Muscle weakness (generalized): Secondary | ICD-10-CM

## 2023-07-28 DIAGNOSIS — M25572 Pain in left ankle and joints of left foot: Secondary | ICD-10-CM | POA: Diagnosis not present

## 2023-07-28 NOTE — Therapy (Signed)
OUTPATIENT PHYSICAL THERAPY TREATMENT   Patient Name: Lindsay Hayden MRN: [id number] DOB:11-29-1960, 62 y.o., female Today's Date: 07/28/2023   END OF SESSION:  PT End of Session - 07/28/23 1447     Visit Number 4    Date for PT Re-Evaluation 09/02/23    Authorization Type [employer]    PT Start Time 1442    PT Stop Time 1529    PT Time Calculation (min) 47 min    Activity Tolerance Patient tolerated treatment well    Behavior During Therapy WFL for tasks assessed/performed                Past Medical History:  Diagnosis Date   Anemia    Arthritis    Asthma    Cardiomyopathy (HCC)    EF 45% on echo 03/2021   Chronic combined systolic and diastolic CHF (congestive heart failure) (HCC)    Diabetes mellitus without complication (HCC)    Hypercalcemia    Hyperlipidemia    Hypertension    Hypothyroid    LBBB (left bundle branch block)    LVH (left ventricular hypertrophy)    NICM (nonischemic cardiomyopathy) (HCC)    OSA (obstructive sleep apnea)    Overweight    Prediabetes    Shortness of breath    Sleep apnea    Past Surgical History:  Procedure Laterality Date   ACHILLES TENDON SURGERY     right   BREAST BIOPSY Right 02/13/2023   MM RT BREAST BX W LOC DEV 1ST LESION IMAGE BX SPEC STEREO GUIDE 02/13/2023 [facility]   CESAREAN SECTION     x2   KNEE ARTHROSCOPY WITH MENISCAL REPAIR     right knee   OVARIAN CYST REMOVAL     right   Patient Active Problem List   Diagnosis Date Noted   DOE (dyspnea on exertion) 02/25/2023   Chronic combined systolic and diastolic CHF (congestive heart failure) (HCC) 11/08/2020   Hypercholesterolemia 09/11/2020   Hypothyroidism 09/11/2020   Mild persistent asthma, uncomplicated 09/11/2020   Osteoarthritis of knee 09/11/2020   Sciatica 09/11/2020   Hyperglycemia 09/11/2020   Hyperlipidemia associated with type 2 diabetes mellitus (HCC) 08/29/2020   Vitamin D deficiency 08/29/2020   Combined systolic and  diastolic congestive heart failure (HCC) 08/09/2020   Diabetes mellitus (HCC) 08/09/2020   DCM (dilated cardiomyopathy) (HCC) 02/15/2019   LBBB (left bundle branch block) 02/15/2019   Hypertension 02/15/2019   Obstructive sleep apnea syndrome 02/15/2019   Class 2 severe obesity with serious comorbidity and body mass index (BMI) of 35.0 to 35.9 in adult (HCC) 02/15/2019    PCP: Sigmund Hazel, MD   REFERRING PROVIDER: Edwin Cap, DPM   REFERRING DIAG: [phone] (ICD-10-CM) - Achilles tendinitis of right lower extremity   THERAPY DIAG:  Pain in left ankle and joints of left foot  Muscle weakness (generalized)  Other abnormalities of gait and mobility  RATIONALE FOR EVALUATION AND TREATMENT: Rehabilitation  ONSET DATE: 1+ yr, worsening as of September 2024  NEXT MD VISIT: 08/05/2023   SUBJECTIVE:  SUBJECTIVE STATEMENT: Pt reports improved pain, Friday she worked 10 hours and afterwards had some increased pain.   PAIN: Are you having pain? No and Yes: NPRS scale: 0/10 Pain location: posterior medial L heel Pain description: burning Aggravating factors: walking, stairs Relieving factors: Voltaren, Aleve, ace wrapping  PERTINENT HISTORY:  history of R foot surgery for heel spurs, plantar fasciitis, and Achilles tendon damage in 04/2011; DM; arthritis; asthma; cardiomyopathy; HTN; hypothyroidism; OSA; obesity; R sciatica  PRECAUTIONS: Other: walking boot is recommended for all walking to reduce strain on the tendon to allow soft tissue rest and healing     RED FLAGS: None  WEIGHT BEARING RESTRICTIONS: Yes WBAT in CAM boot for all walking  FALLS:  Has patient fallen in last 6 months? No  LIVING ENVIRONMENT: Lives with: lives with their partner Lives in:  House/apartment Stairs: Yes: Internal: 14 steps; on left going up and External: 3 steps; bilateral but cannot reach both Has following equipment at home: Crutches and CAM walking boot  OCCUPATION: FT - [employer] - responsible for re-wrapping damaged shipping packaging - mixed sitting (60%) > standing (~40%); full standing for all overtime work  PLOF: Independent and Leisure: watching TV, walking 20-30 min - 4 days/wk (had to stop due to L foot pain)   PATIENT GOALS: "Be able to walk w/o pain."   OBJECTIVE: (objective measures completed at initial evaluation unless otherwise dated)  DIAGNOSTIC FINDINGS:  06/24/23 - R foot x-ray: Calcaneal spurs, Haglund deformity, no fracture or stress fracture   PATIENT SURVEYS:  LEFS 41 / 80 = 51.2 %  COGNITION: Overall cognitive status: Within functional limits for tasks assessed    SENSATION: WFL Intermittent numbness in L 2nd toe  EDEMA:  Figure 8: R = 54.0 cm, L = 54.5 cm (has not been on her feet much yet today and was off work yesterday)  MUSCLE LENGTH: Hamstrings:  ITB:  Piriformis:  Hip flexors: mild/mod tight R>L Quads:  Heelcord: mod tight B  POSTURE:  L>R equinovalgus  PALPATION: TTP over Achilles tendon and medial ankle  LOWER EXTREMITY ROM:  Active ROM Right eval Left eval  Ankle dorsiflexion 4 -10  Ankle plantarflexion 42 41  Ankle inversion 34 22  Ankle eversion 17 12   Passive ROM Left eval  Ankle dorsiflexion 4  Ankle plantarflexion   Ankle inversion 30  Ankle eversion 24  (Blank rows = not tested)  LOWER EXTREMITY MMT:  MMT Right eval Left eval  Hip flexion 4+ (pain in R hip) 4-  Hip extension 4- 3+  Hip abduction 4- 4-  Hip adduction 4- 4  Hip internal rotation 3+ 4-  Hip external rotation 4- (pain in R hip) 4-  Knee flexion 4 4-  Knee extension 5 4+  Ankle dorsiflexion 5 4-  Ankle plantarflexion    Ankle inversion 5 4  Ankle eversion 5 3+   (Blank rows = not tested)  FUNCTIONAL TESTS:   SLS: R = 13.63 sec, L = 1.69 sec   GAIT: Distance walked: clinic distances Assistive device utilized:  CAM walking boot and None Level of assistance: Complete Independence Gait pattern: decreased stance time- Left, Right hip hike, and antalgic   TODAY'S TREATMENT:  07/28/23 THERAPEUTIC EXERCISE: to improve flexibility, strength and mobility.  Demonstration, verbal and tactile cues throughout for technique.  Nustep L5 x 6 min L ankle EV,IV,DF YTB 2x10; PF x 10 YTB - tolerable BAPS board level 2 L ankle:  -EV/IV with 2.5 lb AL then  AM x 10 each  -PF/DF with 2.5 lb posterior x 10  Seated L hamstring + gastroc stretch w/ strap 2x30" Bridge + GTB hip ABD isometric 2x10 Clamshells GTB s/l x 10 bil   07/22/23 THERAPEUTIC EXERCISE: to improve flexibility, strength and mobility.  Demonstration, verbal and tactile cues throughout for technique.  Nustep L4 x 6 min Long sitting L gastroc stretch x 30" Long sitting L soleus stretch x 30" Long sitting L ankle pumps x 20 Long sitting L ankle circles CW/CCW x 10 each Long sitting hip hinge L HS stretch x 30" Seated B ankle inversion isometric ball squeeze 10 x 5" Seated L ankle eversion isometric into ball 10 x 5" Seated L ankle DF isometric into ball 10 x 5" Seated YTB 4-way L ankle x 10 - increased pain reported with PF  Bridge + GTB hip ABD isometric 10 x 5" Hooklying GTB hip ABD/ER clam 10 x 3-5"   07/15/23 THERAPEUTIC EXERCISE: to improve flexibility, strength and mobility.  Demonstration, verbal and tactile cues throughout for technique.  Nustep L3x43min Long sitting L gastroc stretch x 30" Long sitting L soleus stretch x 30" L hamstring stretch seated 2x30" Seated heel/toe raises x 10 bil BAPS board L ankle: PF/DF, CW/CCW circles, EV/IV x 10 each Manual Therapy: to decrease muscle spasm, pain and improve mobility.  STM to L medial hamstrings   07/08/23 - Eval THERAPEUTIC EXERCISE: to improve flexibility, strength and  mobility.  Demonstration, verbal and tactile cues throughout for technique.  Long sitting L gastroc stretch x 30" Long sitting L soleus stretch x 30" Long sitting L ankle pumps x 20 Long sitting L ankle circles CW/CCW x 10 each   PATIENT EDUCATION:  Education details: HEP review and HEP update - ankle and proximal LE strengthening   Person educated: Patient Education method: Programmer, multimedia, Demonstration, Verbal cues, and Handouts Education comprehension: verbalized understanding, returned demonstration, verbal cues required, and needs further education  HOME EXERCISE PROGRAM: Access Code: [email or web address] URL: [email or web address] Date: 07/22/2023 Prepared by: Glenetta Hew  Exercises - Long Sitting Calf Stretch with Strap  - 2 x daily - 7 x weekly - 3 reps - 30 sec hold - Long Sitting Soleus Stretch on Bolster with Strap (Mirrored)  - 2 x daily - 7 x weekly - 3 reps - 30 sec hold - Supine Single Leg Ankle Pumps (Mirrored)  - 2 x daily - 7 x weekly - 2 sets - 10 reps - 3 sec hold - Supine Ankle Circles  - 2 x daily - 7 x weekly - 2 sets - 10 reps - Seated Heel Toe Raises  - 1 x daily - 7 x weekly - 2-3 sets - 10 reps - Seated Ankle Inversion Eversion AROM  - 1 x daily - 7 x weekly - 2-3 sets - 10 reps - Seated Table Hamstring Stretch  - 2 x daily - 7 x weekly - 3 sets - 3 reps - 30 sec hold - Seated Ankle Dorsiflexion with Resistance (Mirrored)  - 1 x daily - 7 x weekly - 2 sets - 10 reps - 3 sec hold - Seated Ankle Inversion with Resistance (Mirrored)  - 1 x daily - 7 x weekly - 2 sets - 10 reps - 3 sec hold - Seated Ankle Eversion with Resistance  - 1 x daily - 7 x weekly - 2 sets - 10 reps - 3 sec hold - Bridge with Resistance  - 1 x daily - 7 x weekly -  2 sets - 10 reps - 5 sec hold - Hooklying Clamshell with Resistance  - 1 x daily - 7 x weekly - 2 sets - 10 reps - 3-5 sec hold   ASSESSMENT:  CLINICAL IMPRESSION: Lindsay Hayden had concerns about resisted ankle movements  added to HEP, which required cues to correct her form. She denied having any pain coming in but does continue to note increased pain after prolonged activity. Better tolerance shown for YTB PF today therefore added to HEP. Continued with proximal LE strengthening as well. STG #2 met for pain improvement (80% improvement noted from patient). Lindsay Hayden will benefit from skilled PT to address above deficits to improve mobility and activity tolerance with decreased pain interference.   OBJECTIVE IMPAIRMENTS: Abnormal gait, decreased activity tolerance, decreased balance, decreased coordination, decreased endurance, decreased knowledge of condition, decreased mobility, difficulty walking, decreased ROM, decreased strength, hypomobility, increased edema, increased fascial restrictions, impaired perceived functional ability, increased muscle spasms, impaired flexibility, postural dysfunction, and pain.   ACTIVITY LIMITATIONS: carrying, lifting, standing, squatting, stairs, bed mobility, and locomotion level  PARTICIPATION LIMITATIONS: meal prep, cleaning, laundry, shopping, community activity, and occupation  PERSONAL FACTORS: Fitness, Past/current experiences, Time since onset of injury/illness/exacerbation, and 3+ comorbidities: history of R foot surgery for heel spurs, plantar fasciitis, and Achilles tendon damage in 04/2011; DM; arthritis; asthma; cardiomyopathy; HTN; hypothyroidism; OSA; obesity; R sciatica  are also affecting patient's functional outcome.   REHAB POTENTIAL: Good  CLINICAL DECISION MAKING: Evolving/moderate complexity  EVALUATION COMPLEXITY: Moderate   GOALS: Goals reviewed with patient? Yes  SHORT TERM GOALS: Target date: 08/05/2023  Patient will be independent with initial HEP. Baseline:  Goal status: MET - 07/22/23  2.  Patient will report at least 25% improvement in L ankle/foot pain to improve QOL. Baseline: 5-6/10 Goal status: MET- 07/28/23  3.  Patient will improve  L ankle DF to at least neutral to promote more normal gait pattern. Baseline: -10 Goal status: IN PROGRESS   LONG TERM GOALS: Target date: 09/02/2023   Patient will be independent with advanced/ongoing HEP to improve outcomes and carryover.  Baseline:  Goal status: IN PROGRESS  2.  Patient will report at least 50-75% improvement in L ankle/foot pain to improve QOL. Baseline: 5-6/10 Goal status: IN PROGRESS  3.  Patient will demonstrate improved L ankle AROM to [facility] to allow for normal gait and stair mechanics. Baseline: Refer to above LE ROM table Goal status: IN PROGRESS  4.  Patient will demonstrate improved B LE strength to >/= 4+/5 for improved stability and ease of mobility. Baseline: Refer to above LE MMT table Goal status: IN PROGRESS  5.  Patient will be able to ambulate with normal gait pattern without CAM boot or increased foot/ankle pain to access community.  Baseline: Antalgic gait in CAM boot Goal status: IN PROGRESS  6. Patient will be able to ascend/descend stairs with 1 HR and reciprocal step pattern safely to access home and community.  Baseline: NT Goal status: IN PROGRESS  7.  Patient will report >/= 50/80 on LEFS to demonstrate improved functional ability. Baseline: 41 / 80 = 51.2 % Goal status: IN PROGRESS  8.  Patient will demonstrate ability to maintain L SLS for at least 10 sec w/o support to demonstrate improved ankle stability. Baseline: SLS: R = 13.63 sec, L = 1.69 sec  Goal status: IN PROGRESS    PLAN:  PT FREQUENCY: 2x/week  PT DURATION: 8 weeks  PLANNED INTERVENTIONS: 97164- PT Re-evaluation, 97110-Therapeutic exercises, 97530- Therapeutic  activity, [id number]- Neuromuscular re-education, [phone]- Self Care, 72536- Manual therapy, [id number]- Gait training, [phone]- Orthotic Fit/training, [id number]- Aquatic Therapy, 97014- Electrical stimulation (unattended), [id number]- Electrical stimulation (manual), [id number]- Vasopneumatic device, [id number]- Ultrasound, [id number]-  Ionotophoresis 4mg /ml Dexamethasone, Patient/Family education, Balance training, Stair training, Taping, Dry Needling, Joint mobilization, DME instructions, Cryotherapy, and Moist heat  PLAN FOR NEXT SESSION: gently progress L ankle ROM/flexibility and pain-free ankle strengthening as tolerated; progress proximal LE strengthening; review/update HEP accordingly; MT +/- DN and modalities for pain management as needed   Darleene Cleaver, PTA 07/28/2023, 3:29 PM

## 2023-08-04 ENCOUNTER — Ambulatory Visit: Payer: 59

## 2023-08-04 DIAGNOSIS — R2689 Other abnormalities of gait and mobility: Secondary | ICD-10-CM

## 2023-08-04 DIAGNOSIS — M6281 Muscle weakness (generalized): Secondary | ICD-10-CM

## 2023-08-04 DIAGNOSIS — M25572 Pain in left ankle and joints of left foot: Secondary | ICD-10-CM

## 2023-08-04 NOTE — Therapy (Signed)
OUTPATIENT PHYSICAL THERAPY TREATMENT   Patient Name: Lindsay Hayden MRN: [id number] DOB:June 21, 1961, 62 y.o., female Today's Date: 08/04/2023   END OF SESSION:  PT End of Session - 08/04/23 1618     Visit Number 5    Date for PT Re-Evaluation 09/02/23    Authorization Type [employer]    PT Start Time 1532    PT Stop Time 1615    PT Time Calculation (min) 43 min    Activity Tolerance Patient tolerated treatment well    Behavior During Therapy WFL for tasks assessed/performed                 Past Medical History:  Diagnosis Date   Anemia    Arthritis    Asthma    Cardiomyopathy (HCC)    EF 45% on echo 03/2021   Chronic combined systolic and diastolic CHF (congestive heart failure) (HCC)    Diabetes mellitus without complication (HCC)    Hypercalcemia    Hyperlipidemia    Hypertension    Hypothyroid    LBBB (left bundle branch block)    LVH (left ventricular hypertrophy)    NICM (nonischemic cardiomyopathy) (HCC)    OSA (obstructive sleep apnea)    Overweight    Prediabetes    Shortness of breath    Sleep apnea    Past Surgical History:  Procedure Laterality Date   ACHILLES TENDON SURGERY     right   BREAST BIOPSY Right 02/13/2023   MM RT BREAST BX W LOC DEV 1ST LESION IMAGE BX SPEC STEREO GUIDE 02/13/2023 [facility]   CESAREAN SECTION     x2   KNEE ARTHROSCOPY WITH MENISCAL REPAIR     right knee   OVARIAN CYST REMOVAL     right   Patient Active Problem List   Diagnosis Date Noted   DOE (dyspnea on exertion) 02/25/2023   Chronic combined systolic and diastolic CHF (congestive heart failure) (HCC) 11/08/2020   Hypercholesterolemia 09/11/2020   Hypothyroidism 09/11/2020   Mild persistent asthma, uncomplicated 09/11/2020   Osteoarthritis of knee 09/11/2020   Sciatica 09/11/2020   Hyperglycemia 09/11/2020   Hyperlipidemia associated with type 2 diabetes mellitus (HCC) 08/29/2020   Vitamin D deficiency 08/29/2020   Combined systolic and  diastolic congestive heart failure (HCC) 08/09/2020   Diabetes mellitus (HCC) 08/09/2020   DCM (dilated cardiomyopathy) (HCC) 02/15/2019   LBBB (left bundle branch block) 02/15/2019   Hypertension 02/15/2019   Obstructive sleep apnea syndrome 02/15/2019   Class 2 severe obesity with serious comorbidity and body mass index (BMI) of 35.0 to 35.9 in adult (HCC) 02/15/2019    PCP: Sigmund Hazel, MD   REFERRING PROVIDER: Edwin Cap, DPM   REFERRING DIAG: [phone] (ICD-10-CM) - Achilles tendinitis of right lower extremity   THERAPY DIAG:  Pain in left ankle and joints of left foot  Muscle weakness (generalized)  Other abnormalities of gait and mobility  RATIONALE FOR EVALUATION AND TREATMENT: Rehabilitation  ONSET DATE: 1+ yr, worsening as of September 2024  NEXT MD VISIT: 08/05/2023   SUBJECTIVE:  SUBJECTIVE STATEMENT: Pt feels like she overworked yesterday while at work, feels some discomfort right over achilles today  PAIN: Are you having pain? No and Yes: NPRS scale: 4/10 Pain location: posterior medial L heel Pain description: burning Aggravating factors: walking, stairs Relieving factors: Voltaren, Aleve, ace wrapping  PERTINENT HISTORY:  history of R foot surgery for heel spurs, plantar fasciitis, and Achilles tendon damage in 04/2011; DM; arthritis; asthma; cardiomyopathy; HTN; hypothyroidism; OSA; obesity; R sciatica  PRECAUTIONS: Other: walking boot is recommended for all walking to reduce strain on the tendon to allow soft tissue rest and healing     RED FLAGS: None  WEIGHT BEARING RESTRICTIONS: Yes WBAT in CAM boot for all walking  FALLS:  Has patient fallen in last 6 months? No  LIVING ENVIRONMENT: Lives with: lives with their partner Lives in:  House/apartment Stairs: Yes: Internal: 14 steps; on left going up and External: 3 steps; bilateral but cannot reach both Has following equipment at home: Crutches and CAM walking boot  OCCUPATION: FT - [employer] - responsible for re-wrapping damaged shipping packaging - mixed sitting (60%) > standing (~40%); full standing for all overtime work  PLOF: Independent and Leisure: watching TV, walking 20-30 min - 4 days/wk (had to stop due to L foot pain)   PATIENT GOALS: "Be able to walk w/o pain."   OBJECTIVE: (objective measures completed at initial evaluation unless otherwise dated)  DIAGNOSTIC FINDINGS:  06/24/23 - R foot x-ray: Calcaneal spurs, Haglund deformity, no fracture or stress fracture   PATIENT SURVEYS:  LEFS 41 / 80 = 51.2 %  COGNITION: Overall cognitive status: Within functional limits for tasks assessed    SENSATION: WFL Intermittent numbness in L 2nd toe  EDEMA:  Figure 8: R = 54.0 cm, L = 54.5 cm (has not been on her feet much yet today and was off work yesterday)  MUSCLE LENGTH: Hamstrings:  ITB:  Piriformis:  Hip flexors: mild/mod tight R>L Quads:  Heelcord: mod tight B  POSTURE:  L>R equinovalgus  PALPATION: TTP over Achilles tendon and medial ankle  LOWER EXTREMITY ROM:  Active ROM Right eval Left eval L 08/03/13  Ankle dorsiflexion 4 -10 0  Ankle plantarflexion 42 41 60  Ankle inversion 34 22 40  Ankle eversion 17 12 32   Passive ROM Left eval  Ankle dorsiflexion 4  Ankle plantarflexion   Ankle inversion 30  Ankle eversion 24  (Blank rows = not tested)  LOWER EXTREMITY MMT:  MMT Right eval Left eval  Hip flexion 4+ (pain in R hip) 4-  Hip extension 4- 3+  Hip abduction 4- 4-  Hip adduction 4- 4  Hip internal rotation 3+ 4-  Hip external rotation 4- (pain in R hip) 4-  Knee flexion 4 4-  Knee extension 5 4+  Ankle dorsiflexion 5 4-  Ankle plantarflexion    Ankle inversion 5 4  Ankle eversion 5 3+   (Blank rows = not  tested)  FUNCTIONAL TESTS:  SLS: R = 13.63 sec, L = 1.69 sec   GAIT: Distance walked: clinic distances Assistive device utilized:  CAM walking boot and None Level of assistance: Complete Independence Gait pattern: decreased stance time- Left, Right hip hike, and antalgic   TODAY'S TREATMENT:  08/04/23 THERAPEUTIC EXERCISE: to improve flexibility, strength and mobility.  Demonstration, verbal and tactile cues throughout for technique. Seated gastroc stretch with strap 2x30" BAPS board level 2 L ankle:  -EV/IV with 2.5 lb AL then AM 2 x 10 each  -  PF/DF with 2.5 lb posterior 2 x 10  Measured L ankle ROM  Bridge + blue TB hip ABD isometric 2x10 Clamshells blue TB s/l x 10 bil  07/28/23 THERAPEUTIC EXERCISE: to improve flexibility, strength and mobility.  Demonstration, verbal and tactile cues throughout for technique.  Nustep L5 x 6 min L ankle EV,IV,DF YTB 2x10; PF x 10 YTB - tolerable BAPS board level 2 L ankle:  -EV/IV with 2.5 lb AL then AM x 10 each  -PF/DF with 2.5 lb posterior x 10  Seated L hamstring + gastroc stretch w/ strap 2x30" Bridge + GTB hip ABD isometric 2x10 Clamshells GTB s/l x 10 bil   07/22/23 THERAPEUTIC EXERCISE: to improve flexibility, strength and mobility.  Demonstration, verbal and tactile cues throughout for technique.  Nustep L4 x 6 min Long sitting L gastroc stretch x 30" Long sitting L soleus stretch x 30" Long sitting L ankle pumps x 20 Long sitting L ankle circles CW/CCW x 10 each Long sitting hip hinge L HS stretch x 30" Seated B ankle inversion isometric ball squeeze 10 x 5" Seated L ankle eversion isometric into ball 10 x 5" Seated L ankle DF isometric into ball 10 x 5" Seated YTB 4-way L ankle x 10 - increased pain reported with PF  Bridge + GTB hip ABD isometric 10 x 5" Hooklying GTB hip ABD/ER clam 10 x 3-5"   07/15/23 THERAPEUTIC EXERCISE: to improve flexibility, strength and mobility.  Demonstration, verbal and tactile cues  throughout for technique.  Nustep L3x49min Long sitting L gastroc stretch x 30" Long sitting L soleus stretch x 30" L hamstring stretch seated 2x30" Seated heel/toe raises x 10 bil BAPS board L ankle: PF/DF, CW/CCW circles, EV/IV x 10 each Manual Therapy: to decrease muscle spasm, pain and improve mobility.  STM to L medial hamstrings   07/08/23 - Eval THERAPEUTIC EXERCISE: to improve flexibility, strength and mobility.  Demonstration, verbal and tactile cues throughout for technique.  Long sitting L gastroc stretch x 30" Long sitting L soleus stretch x 30" Long sitting L ankle pumps x 20 Long sitting L ankle circles CW/CCW x 10 each   PATIENT EDUCATION:  Education details: HEP review and HEP update - ankle and proximal LE strengthening   Person educated: Patient Education method: Programmer, multimedia, Demonstration, Verbal cues, and Handouts Education comprehension: verbalized understanding, returned demonstration, verbal cues required, and needs further education  HOME EXERCISE PROGRAM: Access Code: [email or web address] URL: [email or web address] Date: 07/22/2023 Prepared by: Glenetta Hew  Exercises - Long Sitting Calf Stretch with Strap  - 2 x daily - 7 x weekly - 3 reps - 30 sec hold - Long Sitting Soleus Stretch on Bolster with Strap (Mirrored)  - 2 x daily - 7 x weekly - 3 reps - 30 sec hold - Supine Single Leg Ankle Pumps (Mirrored)  - 2 x daily - 7 x weekly - 2 sets - 10 reps - 3 sec hold - Supine Ankle Circles  - 2 x daily - 7 x weekly - 2 sets - 10 reps - Seated Heel Toe Raises  - 1 x daily - 7 x weekly - 2-3 sets - 10 reps - Seated Ankle Inversion Eversion AROM  - 1 x daily - 7 x weekly - 2-3 sets - 10 reps - Seated Table Hamstring Stretch  - 2 x daily - 7 x weekly - 3 sets - 3 reps - 30 sec hold - Seated Ankle Dorsiflexion with Resistance (Mirrored)  - 1 x daily -  7 x weekly - 2 sets - 10 reps - 3 sec hold - Seated Ankle Inversion with Resistance (Mirrored)  - 1 x daily -  7 x weekly - 2 sets - 10 reps - 3 sec hold - Seated Ankle Eversion with Resistance  - 1 x daily - 7 x weekly - 2 sets - 10 reps - 3 sec hold - Bridge with Resistance  - 1 x daily - 7 x weekly - 2 sets - 10 reps - 5 sec hold - Hooklying Clamshell with Resistance  - 1 x daily - 7 x weekly - 2 sets - 10 reps - 3-5 sec hold   ASSESSMENT:  CLINICAL IMPRESSION: Pt shows improved ankle ROM overall, ankle DF is now to neutral. Continued with strengthening for L ankle along with proximal LE strengthening to improve support. She will see doctor coming up tomorrow and hopefully be ready for transition out of CAM boot. All STG met as of today. Knya will benefit from skilled PT to address above deficits to improve mobility and activity tolerance with decreased pain interference.   OBJECTIVE IMPAIRMENTS: Abnormal gait, decreased activity tolerance, decreased balance, decreased coordination, decreased endurance, decreased knowledge of condition, decreased mobility, difficulty walking, decreased ROM, decreased strength, hypomobility, increased edema, increased fascial restrictions, impaired perceived functional ability, increased muscle spasms, impaired flexibility, postural dysfunction, and pain.   ACTIVITY LIMITATIONS: carrying, lifting, standing, squatting, stairs, bed mobility, and locomotion level  PARTICIPATION LIMITATIONS: meal prep, cleaning, laundry, shopping, community activity, and occupation  PERSONAL FACTORS: Fitness, Past/current experiences, Time since onset of injury/illness/exacerbation, and 3+ comorbidities: history of R foot surgery for heel spurs, plantar fasciitis, and Achilles tendon damage in 04/2011; DM; arthritis; asthma; cardiomyopathy; HTN; hypothyroidism; OSA; obesity; R sciatica  are also affecting patient's functional outcome.   REHAB POTENTIAL: Good  CLINICAL DECISION MAKING: Evolving/moderate complexity  EVALUATION COMPLEXITY: Moderate   GOALS: Goals reviewed with  patient? Yes  SHORT TERM GOALS: Target date: 08/05/2023  Patient will be independent with initial HEP. Baseline:  Goal status: MET - 07/22/23  2.  Patient will report at least 25% improvement in L ankle/foot pain to improve QOL. Baseline: 5-6/10 Goal status: MET- 07/28/23  3.  Patient will improve L ankle DF to at least neutral to promote more normal gait pattern. Baseline: -10 Goal status: MET- 08/04/23   LONG TERM GOALS: Target date: 09/02/2023   Patient will be independent with advanced/ongoing HEP to improve outcomes and carryover.  Baseline:  Goal status: IN PROGRESS  2.  Patient will report at least 50-75% improvement in L ankle/foot pain to improve QOL. Baseline: 5-6/10 Goal status: IN PROGRESS  3.  Patient will demonstrate improved L ankle AROM to [facility] to allow for normal gait and stair mechanics. Baseline: Refer to above LE ROM table Goal status: IN PROGRESS  4.  Patient will demonstrate improved B LE strength to >/= 4+/5 for improved stability and ease of mobility. Baseline: Refer to above LE MMT table Goal status: IN PROGRESS  5.  Patient will be able to ambulate with normal gait pattern without CAM boot or increased foot/ankle pain to access community.  Baseline: Antalgic gait in CAM boot Goal status: IN PROGRESS  6. Patient will be able to ascend/descend stairs with 1 HR and reciprocal step pattern safely to access home and community.  Baseline: NT Goal status: IN PROGRESS  7.  Patient will report >/= 50/80 on LEFS to demonstrate improved functional ability. Baseline: 41 / 80 = 51.2 % Goal  status: IN PROGRESS  8.  Patient will demonstrate ability to maintain L SLS for at least 10 sec w/o support to demonstrate improved ankle stability. Baseline: SLS: R = 13.63 sec, L = 1.69 sec  Goal status: IN PROGRESS    PLAN:  PT FREQUENCY: 2x/week  PT DURATION: 8 weeks  PLANNED INTERVENTIONS: 97164- PT Re-evaluation, 97110-Therapeutic exercises, 97530-  Therapeutic activity, [id number]- Neuromuscular re-education, 97535- Self Care, 95284- Manual therapy, [id number]- Gait training, [phone]- Orthotic Fit/training, [id number]- Aquatic Therapy, 97014- Electrical stimulation (unattended), [phone]- Electrical stimulation (manual), [id number]- Vasopneumatic device, [id number]- Ultrasound, [id number]- Ionotophoresis 4mg /ml Dexamethasone, Patient/Family education, Balance training, Stair training, Taping, Dry Needling, Joint mobilization, DME instructions, Cryotherapy, and Moist heat  PLAN FOR NEXT SESSION: gently progress L ankle ROM - needs more DF and pain-free ankle strengthening as tolerated; progress proximal LE strengthening; review/update HEP accordingly; MT +/- DN and modalities for pain management as needed   Darleene Cleaver, PTA 08/04/2023, 4:18 PM

## 2023-08-05 ENCOUNTER — Ambulatory Visit (INDEPENDENT_AMBULATORY_CARE_PROVIDER_SITE_OTHER): Payer: 59 | Admitting: Podiatry

## 2023-08-05 ENCOUNTER — Encounter: Payer: Self-pay | Admitting: Podiatry

## 2023-08-05 VITALS — Ht 63.0 in | Wt 209.0 lb

## 2023-08-05 DIAGNOSIS — M62462 Contracture of muscle, left lower leg: Secondary | ICD-10-CM | POA: Diagnosis not present

## 2023-08-05 DIAGNOSIS — M7662 Achilles tendinitis, left leg: Secondary | ICD-10-CM

## 2023-08-05 NOTE — Progress Notes (Signed)
  Subjective:  Patient ID: Lindsay Hayden, female    DOB: January 08, 1961,  MRN: [id number]  Chief Complaint  Patient presents with   Foot Pain    new pt-L achilles tendinitis pain    Discussed the use of AI scribe software for clinical note transcription with the patient, who gave verbal consent to proceed.  History of Present Illness   She returns for therapy has been helpful says it is doing much better and nearly pain-free         Objective:    Physical Exam   MUSCULOSKELETAL: Presence of heel spurs and Haglund deformity noted.  +2 DP and PT pulse.  Minimal pain in site.  Improvement in equinus      No images are attached to the encounter.    Results   RADIOLOGY X-ray: Calcaneal spurs, Haglund deformity, no fracture or stress fracture      Assessment:   1. Achilles tendinitis of left lower extremity   2. Gastrocnemius equinus of left lower extremity      Plan:  Patient was evaluated and treated and all questions answered.  Assessment and Plan    Left Achilles Tendonitis   Do much better and is improved greatly with physical therapy.  She will complete her therapy over the next couple of weeks and she may transition out of the cam boot back to regular shoes utilizing a heel lift, she will utilize to these over the next 2 weeks then remove 1 and then remove the final 1 to be flat in her shoe.  Discussed continuing her stretching and home therapy exercises beyond this so that she has continued improvement.  Return to see me as needed if it does not improve.      Return in about 6 weeks (around 08/05/2023) for re-check Achilles tendon.

## 2023-08-11 ENCOUNTER — Other Ambulatory Visit: Payer: Self-pay | Admitting: Cardiology

## 2023-08-11 DIAGNOSIS — I5042 Chronic combined systolic (congestive) and diastolic (congestive) heart failure: Secondary | ICD-10-CM

## 2023-08-12 ENCOUNTER — Ambulatory Visit: Payer: 59 | Admitting: Physical Therapy

## 2023-08-12 ENCOUNTER — Encounter: Payer: Self-pay | Admitting: Physical Therapy

## 2023-08-12 DIAGNOSIS — M25572 Pain in left ankle and joints of left foot: Secondary | ICD-10-CM | POA: Diagnosis not present

## 2023-08-12 DIAGNOSIS — R2689 Other abnormalities of gait and mobility: Secondary | ICD-10-CM

## 2023-08-12 DIAGNOSIS — M6281 Muscle weakness (generalized): Secondary | ICD-10-CM

## 2023-08-12 NOTE — Therapy (Signed)
OUTPATIENT PHYSICAL THERAPY TREATMENT   Patient Name: Lindsay Hayden MRN: [id number] DOB:02-19-1961, 62 y.o., female Today's Date: 08/12/2023   END OF SESSION:  PT End of Session - 08/12/23 1100     Visit Number 6    Date for PT Re-Evaluation 09/02/23    Authorization Type [employer]    PT Start Time 1100    PT Stop Time 1144    PT Time Calculation (min) 44 min    Activity Tolerance Patient tolerated treatment well    Behavior During Therapy WFL for tasks assessed/performed                 Past Medical History:  Diagnosis Date   Anemia    Arthritis    Asthma    Cardiomyopathy (HCC)    EF 45% on echo 03/2021   Chronic combined systolic and diastolic CHF (congestive heart failure) (HCC)    Diabetes mellitus without complication (HCC)    Hypercalcemia    Hyperlipidemia    Hypertension    Hypothyroid    LBBB (left bundle branch block)    LVH (left ventricular hypertrophy)    NICM (nonischemic cardiomyopathy) (HCC)    OSA (obstructive sleep apnea)    Overweight    Prediabetes    Shortness of breath    Sleep apnea    Past Surgical History:  Procedure Laterality Date   ACHILLES TENDON SURGERY     right   BREAST BIOPSY Right 02/13/2023   MM RT BREAST BX W LOC DEV 1ST LESION IMAGE BX SPEC STEREO GUIDE 02/13/2023 [facility]   CESAREAN SECTION     x2   KNEE ARTHROSCOPY WITH MENISCAL REPAIR     right knee   OVARIAN CYST REMOVAL     right   Patient Active Problem List   Diagnosis Date Noted   DOE (dyspnea on exertion) 02/25/2023   Chronic combined systolic and diastolic CHF (congestive heart failure) (HCC) 11/08/2020   Hypercholesterolemia 09/11/2020   Hypothyroidism 09/11/2020   Mild persistent asthma, uncomplicated 09/11/2020   Osteoarthritis of knee 09/11/2020   Sciatica 09/11/2020   Hyperglycemia 09/11/2020   Hyperlipidemia associated with type 2 diabetes mellitus (HCC) 08/29/2020   Vitamin D deficiency 08/29/2020   Combined systolic and  diastolic congestive heart failure (HCC) 08/09/2020   Diabetes mellitus (HCC) 08/09/2020   DCM (dilated cardiomyopathy) (HCC) 02/15/2019   LBBB (left bundle branch block) 02/15/2019   Hypertension 02/15/2019   Obstructive sleep apnea syndrome 02/15/2019   Class 2 severe obesity with serious comorbidity and body mass index (BMI) of 35.0 to 35.9 in adult (HCC) 02/15/2019    PCP: Sigmund Hazel, MD   REFERRING PROVIDER: Edwin Cap, DPM   REFERRING DIAG: [phone] (ICD-10-CM) - Achilles tendinitis of right lower extremity   THERAPY DIAG:  Pain in left ankle and joints of left foot  Muscle weakness (generalized)  Other abnormalities of gait and mobility  RATIONALE FOR EVALUATION AND TREATMENT: Rehabilitation  ONSET DATE: 1+ yr, worsening as of September 2024  NEXT MD VISIT:  PRN   SUBJECTIVE:  SUBJECTIVE STATEMENT: Pt reports 80-90% percent improved since start of PT, but not yet 100%. Pt now out of CAM boot with double inserts to be weaned 1 at a time with hopes of full recovery by Legrand Pitts - if not, she is to reach out to the MD for f/u.  PAIN: Are you having pain? No and Yes: NPRS scale: 4/10 Pain location: posterior medial L heel Pain description: burning Aggravating factors: walking, stairs Relieving factors: Voltaren, Aleve, ace wrapping  PERTINENT HISTORY:  history of R foot surgery for heel spurs, plantar fasciitis, and Achilles tendon damage in 04/2011; DM; arthritis; asthma; cardiomyopathy; HTN; hypothyroidism; OSA; obesity; R sciatica  PRECAUTIONS: Other: walking boot is recommended for all walking to reduce strain on the tendon to allow soft tissue rest and healing     RED FLAGS: None  WEIGHT BEARING RESTRICTIONS: Yes WBAT in CAM boot for all walking  FALLS:   Has patient fallen in last 6 months? 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 - mixed sitting (60%) > standing (~40%); full standing for all overtime work  PLOF: Independent and Leisure: watching TV, walking 20-30 min - 4 days/wk (had to stop due to L foot pain)   PATIENT GOALS: "Be able to walk w/o pain."   OBJECTIVE: (objective measures completed at initial evaluation unless otherwise dated)  DIAGNOSTIC FINDINGS:  06/24/23 - R foot x-ray: Calcaneal spurs, Haglund deformity, no fracture or stress fracture   PATIENT SURVEYS:  LEFS 41 / 80 = 51.2 %  COGNITION: Overall cognitive status: Within functional limits for tasks assessed    SENSATION: WFL Intermittent numbness in L 2nd toe  EDEMA:  Figure 8: R = 54.0 cm, L = 54.5 cm (has not been on her feet much yet today and was off work yesterday)  MUSCLE LENGTH: Hamstrings:  ITB:  Piriformis:  Hip flexors: mild/mod tight R>L Quads:  Heelcord: mod tight B  POSTURE:  L>R equinovalgus  PALPATION: TTP over Achilles tendon and medial ankle  LOWER EXTREMITY ROM:  Active ROM Right eval Left eval L 08/03/13  Ankle dorsiflexion 4 -10 0  Ankle plantarflexion 42 41 60  Ankle inversion 34 22 40  Ankle eversion 17 12 32   Passive ROM Left eval  Ankle dorsiflexion 4  Ankle plantarflexion   Ankle inversion 30  Ankle eversion 24  (Blank rows = not tested)  LOWER EXTREMITY MMT:  MMT Right eval Left eval R 08/12/23 L 08/12/23  Hip flexion 4+ (pain in R hip) 4- 4+ 4+  Hip extension 4- 3+ 4 4-  Hip abduction 4- 4- 4 4  Hip adduction 4- 4 4 4   Hip internal rotation 3+ 4- 4+ 4+  Hip external rotation 4- (pain in R hip) 4- 4- 4   Knee flexion 4 4- 4+ 4  Knee extension 5 4+ 5 5  Ankle dorsiflexion 5 4- 5 4+  Ankle plantarflexion      Ankle inversion 5 4 5  4+  Ankle eversion 5 3+ 5 4   (Blank rows = not tested)  FUNCTIONAL TESTS:  SLS: R = 13.63 sec, L = 1.69 sec   GAIT: Distance walked: clinic distances Assistive device utilized:  CAM walking boot and None Level  of assistance: Complete Independence Gait pattern: decreased stance time- Left, Right hip hike, and antalgic   TODAY'S TREATMENT:   08/12/23 THERAPEUTIC EXERCISE: to improve flexibility, strength and mobility.  Demonstration, verbal and tactile cues throughout for technique.  NuStep L5 x 6 min Standing runner's position gastroc stretch at wall 3 x 30" Standing gastroc stretch with toes on 2" block x 30" - pt reporting better stretch with latter version Standing runner's position soleus stretch at wall 3 x 30" Seated RTB 4-way L ankle x 10 - mild increased pain reported with PF but tolerable Standing RTB 4-way SLR x 10 bil, UE support on back of chair  THERAPEUTIC ACTIVITIES: LE MMT  GAIT TRAINING: To normalize gait pattern. 180 ft with cues for heel strike on weight acceptance and heel-toe progress through stance phase on L foot   08/04/23 THERAPEUTIC EXERCISE: to improve flexibility, strength and mobility.  Demonstration, verbal and tactile cues throughout for technique. Seated gastroc stretch with strap 2x30" BAPS board level 2 L ankle:  -EV/IV with 2.5 lb AL then AM 2 x 10 each  -PF/DF with 2.5 lb posterior 2 x 10  Measured L ankle ROM  Bridge + blue TB hip ABD isometric 2x10 Clamshells blue TB s/l x 10 bil   07/28/23 THERAPEUTIC EXERCISE: to improve flexibility, strength and mobility.  Demonstration, verbal and tactile cues throughout for technique.  Nustep L5 x 6 min L ankle EV,IV,DF YTB 2x10; PF x 10 YTB - tolerable BAPS board level 2 L ankle:  -EV/IV with 2.5 lb AL then AM x 10 each  -PF/DF with 2.5 lb posterior x 10   Seated L hamstring + gastroc stretch w/ strap 2x30" Bridge + GTB hip ABD isometric 2x10 Clamshells GTB s/l x 10 bil    PATIENT EDUCATION:  Education details: HEP review and HEP progression - standing gastroc/soleus stretches & RTB for 4-way ankle   Person educated: Patient Education method: Programmer, multimedia, Demonstration, Verbal cues, and Handouts Education comprehension: verbalized understanding, returned demonstration, verbal cues required, and needs further education  HOME EXERCISE PROGRAM: Access Code: [email or web address] URL: [email or web address] Date: 08/12/2023 Prepared by: Glenetta Hew  Exercises - Supine Single Leg Ankle Pumps (Mirrored)  - 2 x daily - 7 x weekly - 2 sets - 10 reps - 3 sec hold - Supine Ankle Circles  - 2 x daily - 7 x weekly - 2 sets - 10 reps - Seated Heel Toe Raises  - 1 x daily - 7 x weekly - 2-3 sets - 10 reps - Seated Ankle Inversion Eversion AROM  - 1 x daily - 7 x weekly - 2-3 sets - 10 reps - Seated Table Hamstring Stretch  - 2 x daily - 7 x weekly - 3 sets - 3 reps - 30 sec hold - Seated Ankle Dorsiflexion with Resistance (Mirrored)  - 1 x daily - 7 x weekly - 2 sets - 10 reps - 3 sec hold - Seated Ankle Inversion with Resistance (Mirrored)  - 1 x daily - 7 x weekly - 2 sets - 10 reps - 3 sec hold - Seated Ankle Eversion with Resistance  - 1 x daily - 7 x weekly - 2 sets - 10 reps - 3 sec hold - Bridge with Resistance  - 1 x daily - 7 x weekly - 2 sets - 10 reps - 5 sec hold - Hooklying Clamshell with Resistance  - 1 x daily - 7 x weekly - 2 sets - 10 reps - 3-5  sec hold - Clamshell with Resistance  - 1 x daily - 3-4 x weekly - 2 sets - 10 reps - Gastroc Stretch on Wall  - 2 x daily - 7 x weekly - 3 reps - 30 sec hold - Soleus Stretch on Wall  - 2 x daily - 7 x weekly - 3 reps - 30 sec hold   ASSESSMENT:  CLINICAL IMPRESSION: Lindsay Hayden reports the MD weaned her from the CAM boot to double inserts in her shoe which she is to gradually wean.   If no issues with recovery, she will not need to return to MD.  Advanced ankle stretches to standing/weight bearing versions with good tolerance.  Also progressed 4-way ankle resistance to RTB for HEP with good tolerance.  Progressed hip strengthening to standing 4-way SLR to also promote SLS stability for improved proprioceptive control.  MMT revealing gains in strength for most LE muscle groups.  Lindsay Hayden will benefit from continued skilled PT to address ongoing deficits to improve mobility and activity tolerance with decreased pain interference.   OBJECTIVE IMPAIRMENTS: Abnormal gait, decreased activity tolerance, decreased balance, decreased coordination, decreased endurance, decreased knowledge of condition, decreased mobility, difficulty walking, decreased ROM, decreased strength, hypomobility, increased edema, increased fascial restrictions, impaired perceived functional ability, increased muscle spasms, impaired flexibility, postural dysfunction, and pain.   ACTIVITY LIMITATIONS: carrying, lifting, standing, squatting, stairs, bed mobility, and locomotion level  PARTICIPATION LIMITATIONS: meal prep, cleaning, laundry, shopping, community activity, and occupation  PERSONAL FACTORS: Fitness, Past/current experiences, Time since onset of injury/illness/exacerbation, and 3+ comorbidities: history of R foot surgery for heel spurs, plantar fasciitis, and Achilles tendon damage in 04/2011; DM; arthritis; asthma; cardiomyopathy; HTN; hypothyroidism; OSA; obesity; R sciatica  are also affecting patient's functional outcome.   REHAB POTENTIAL: Good  CLINICAL DECISION MAKING: Evolving/moderate complexity  EVALUATION COMPLEXITY: Moderate   GOALS: Goals reviewed with patient? Yes  SHORT TERM GOALS: Target date: 08/05/2023  Patient will be independent with initial HEP. Baseline:  Goal status: MET - 07/22/23  2.  Patient will report at least 25% improvement in L ankle/foot pain to improve  QOL. Baseline: 5-6/10 Goal status: MET - 07/28/23  3.  Patient will improve L ankle DF to at least neutral to promote more normal gait pattern. Baseline: -10 Goal status: MET - 08/04/23   LONG TERM GOALS: Target date: 09/02/2023   Patient will be independent with advanced/ongoing HEP to improve outcomes and carryover.  Baseline:  Goal status: IN PROGRESS  2.  Patient will report at least 50-75% improvement in L ankle/foot pain to improve QOL. Baseline: 5-6/10 Goal status: MET - 08/12/23 - 80-90% improvement   3.  Patient will demonstrate improved L ankle AROM to [facility] to allow for normal gait and stair mechanics. Baseline: Refer to above LE ROM table Goal status: IN PROGRESS  4.  Patient will demonstrate improved B LE strength to >/= 4+/5 for improved stability and ease of mobility. Baseline: Refer to above LE MMT table Goal status: IN PROGRESS - 08/12/23 - overall LE strength improving   5.  Patient will be able to ambulate with normal gait pattern without CAM boot or increased foot/ankle pain to access community.  Baseline: Antalgic gait in CAM boot Goal status: IN PROGRESS  6. Patient will be able to ascend/descend stairs with 1 HR and reciprocal step pattern safely to access home and community.  Baseline: NT Goal status: IN PROGRESS  7.  Patient will report >/= 50/80 on LEFS to demonstrate improved functional ability.  Baseline: 41 / 80 = 51.2 % Goal status: IN PROGRESS  8.  Patient will demonstrate ability to maintain L SLS for at least 10 sec w/o support to demonstrate improved ankle stability. Baseline: SLS: R = 13.63 sec, L = 1.69 sec  Goal status: IN PROGRESS    PLAN:  PT FREQUENCY: 2x/week  PT DURATION: 8 weeks  PLANNED INTERVENTIONS: 97164- PT Re-evaluation, 97110-Therapeutic exercises, 97530- Therapeutic activity, [id number]- Neuromuscular re-education, 97535- Self Care, 91478- Manual therapy, [id number]- Gait training, [phone]- Orthotic Fit/training, [phone]- Aquatic  Therapy, 97014- Electrical stimulation (unattended), [id number]- Electrical stimulation (manual), [id number]- Vasopneumatic device, [id number]- Ultrasound, [id number]- Ionotophoresis 4mg /ml Dexamethasone, Patient/Family education, Balance training, Stair training, Taping, Dry Needling, Joint mobilization, DME instructions, Cryotherapy, and Moist heat  PLAN FOR NEXT SESSION: gently progress L ankle ROM - needs more DF and pain-free ankle strengthening as tolerated; progress proximal LE strengthening; review/update HEP accordingly; MT +/- DN and modalities for pain management as needed   Marry Guan, PT 08/12/2023, 3:14 PM

## 2023-08-15 ENCOUNTER — Other Ambulatory Visit: Payer: Self-pay | Admitting: Physician Assistant

## 2023-08-15 DIAGNOSIS — I5042 Chronic combined systolic (congestive) and diastolic (congestive) heart failure: Secondary | ICD-10-CM

## 2023-08-18 ENCOUNTER — Ambulatory Visit: Payer: 59 | Attending: Podiatry

## 2023-08-18 DIAGNOSIS — M25572 Pain in left ankle and joints of left foot: Secondary | ICD-10-CM | POA: Insufficient documentation

## 2023-08-18 DIAGNOSIS — R2689 Other abnormalities of gait and mobility: Secondary | ICD-10-CM | POA: Diagnosis present

## 2023-08-18 DIAGNOSIS — M6281 Muscle weakness (generalized): Secondary | ICD-10-CM | POA: Insufficient documentation

## 2023-08-18 NOTE — Therapy (Signed)
OUTPATIENT PHYSICAL THERAPY TREATMENT   Patient Name: Myasiah Sjoblom MRN: [id number] DOB:Mar 19, 1961, 62 y.o., female Today's Date: 08/18/2023   END OF SESSION:  PT End of Session - 08/18/23 1113     Visit Number 7    Date for PT Re-Evaluation 09/02/23    Authorization Type [employer]    PT Start Time 1102    PT Stop Time 1148    PT Time Calculation (min) 46 min    Activity Tolerance Patient tolerated treatment well    Behavior During Therapy WFL for tasks assessed/performed                  Past Medical History:  Diagnosis Date   Anemia    Arthritis    Asthma    Cardiomyopathy (HCC)    EF 45% on echo 03/2021   Chronic combined systolic and diastolic CHF (congestive heart failure) (HCC)    Diabetes mellitus without complication (HCC)    Hypercalcemia    Hyperlipidemia    Hypertension    Hypothyroid    LBBB (left bundle branch block)    LVH (left ventricular hypertrophy)    NICM (nonischemic cardiomyopathy) (HCC)    OSA (obstructive sleep apnea)    Overweight    Prediabetes    Shortness of breath    Sleep apnea    Past Surgical History:  Procedure Laterality Date   ACHILLES TENDON SURGERY     right   BREAST BIOPSY Right 02/13/2023   MM RT BREAST BX W LOC DEV 1ST LESION IMAGE BX SPEC STEREO GUIDE 02/13/2023 [facility]   CESAREAN SECTION     x2   KNEE ARTHROSCOPY WITH MENISCAL REPAIR     right knee   OVARIAN CYST REMOVAL     right   Patient Active Problem List   Diagnosis Date Noted   DOE (dyspnea on exertion) 02/25/2023   Chronic combined systolic and diastolic CHF (congestive heart failure) (HCC) 11/08/2020   Hypercholesterolemia 09/11/2020   Hypothyroidism 09/11/2020   Mild persistent asthma, uncomplicated 09/11/2020   Osteoarthritis of knee 09/11/2020   Sciatica 09/11/2020   Hyperglycemia 09/11/2020   Hyperlipidemia associated with type 2 diabetes mellitus (HCC) 08/29/2020   Vitamin D deficiency 08/29/2020   Combined systolic and  diastolic congestive heart failure (HCC) 08/09/2020   Diabetes mellitus (HCC) 08/09/2020   DCM (dilated cardiomyopathy) (HCC) 02/15/2019   LBBB (left bundle branch block) 02/15/2019   Hypertension 02/15/2019   Obstructive sleep apnea syndrome 02/15/2019   Class 2 severe obesity with serious comorbidity and body mass index (BMI) of 35.0 to 35.9 in adult (HCC) 02/15/2019    PCP: Sigmund Hazel, MD   REFERRING PROVIDER: Edwin Cap, DPM   REFERRING DIAG: [phone] (ICD-10-CM) - Achilles tendinitis of right lower extremity   THERAPY DIAG:  Pain in left ankle and joints of left foot  Muscle weakness (generalized)  Other abnormalities of gait and mobility  RATIONALE FOR EVALUATION AND TREATMENT: Rehabilitation  ONSET DATE: 1+ yr, worsening as of September 2024  NEXT MD VISIT:  PRN   SUBJECTIVE:  SUBJECTIVE STATEMENT: Pt denies pain in her L ankle or heel, she notes some pain everywhere else though (unrelated to L ankle).  PAIN: Are you having pain? No and Yes: NPRS scale: 0/10 Pain location: posterior medial L heel Pain description: burning Aggravating factors: walking, stairs Relieving factors: Voltaren, Aleve, ace wrapping  PERTINENT HISTORY:  history of R foot surgery for heel spurs, plantar fasciitis, and Achilles tendon damage in 04/2011; DM; arthritis; asthma; cardiomyopathy; HTN; hypothyroidism; OSA; obesity; R sciatica  PRECAUTIONS: Other: walking boot is recommended for all walking to reduce strain on the tendon to allow soft tissue rest and healing     RED FLAGS: None  WEIGHT BEARING RESTRICTIONS: Yes she may transition out of the cam boot back to regular shoes utilizing a heel lift, she will utilize to these over the next 2 weeks then remove 1 and then remove the final  1 to be flat in her shoe. Per MD note  FALLS:  Has patient fallen in last 6 months? 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 - mixed sitting (60%) > standing (~40%); full standing for all overtime work  PLOF: Independent and Leisure: watching TV, walking 20-30 min - 4 days/wk (had to stop due to L foot pain)   PATIENT GOALS: "Be able to walk w/o pain."   OBJECTIVE: (objective measures completed at initial evaluation unless otherwise dated)  DIAGNOSTIC FINDINGS:  06/24/23 - R foot x-ray: Calcaneal spurs, Haglund deformity, no fracture or stress fracture   PATIENT SURVEYS:  LEFS 41 / 80 = 51.2 %  COGNITION: Overall cognitive status: Within functional limits for tasks assessed    SENSATION: WFL Intermittent numbness in L 2nd toe  EDEMA:  Figure 8: R = 54.0 cm, L = 54.5 cm (has not been on her feet much yet today and was off work yesterday)  MUSCLE LENGTH: Hamstrings:  ITB:  Piriformis:  Hip flexors: mild/mod tight R>L Quads:  Heelcord: mod tight B  POSTURE:  L>R equinovalgus  PALPATION: TTP over Achilles tendon and medial ankle  LOWER EXTREMITY ROM:  Active ROM Right eval Left eval L 08/03/13  Ankle dorsiflexion 4 -10 0  Ankle plantarflexion 42 41 60  Ankle inversion 34 22 40  Ankle eversion 17 12 32   Passive ROM Left eval  Ankle dorsiflexion 4  Ankle plantarflexion   Ankle inversion 30  Ankle eversion 24  (Blank rows = not tested)  LOWER EXTREMITY MMT:  MMT Right eval Left eval R 08/12/23 L 08/12/23  Hip flexion 4+ (pain in R hip) 4- 4+ 4+  Hip extension 4- 3+ 4 4-  Hip abduction 4- 4- 4 4  Hip adduction 4- 4 4 4   Hip internal rotation 3+ 4- 4+ 4+   Hip external rotation 4- (pain in R hip) 4- 4- 4  Knee flexion 4 4- 4+ 4  Knee extension 5 4+ 5 5  Ankle dorsiflexion 5 4- 5 4+  Ankle plantarflexion      Ankle inversion 5 4 5  4+  Ankle eversion 5 3+ 5 4   (Blank rows = not tested)  FUNCTIONAL TESTS:  SLS: R = 13.63 sec, L = 1.69 sec   GAIT: Distance walked: clinic distances Assistive device utilized:  CAM walking boot and None Level of assistance: Complete Independence Gait pattern: decreased stance time- Left, Right hip hike, and antalgic   TODAY'S TREATMENT:  08/18/23 THERAPEUTIC EXERCISE: to improve flexibility, strength and mobility.  Demonstration, verbal and tactile cues throughout for technique.  Rec Bike L3x77min Standing L runner stretch 2x30" Standing L soleus stretch in runner position 2x30" Assessed SLS on LLE- 25 seconds no UE support Standing heel and toe raises x 10 BLE Knee flexion + ankle DF mobilization on 6' step 10x5" bil Standing RTB 4-way SLR x 12 bil, UE support on back of chair  08/12/23 THERAPEUTIC EXERCISE: to improve flexibility, strength and mobility.  Demonstration, verbal and tactile cues throughout for technique.  NuStep L5 x 6 min Standing runner's position gastroc stretch at wall 3 x 30"stan Standing gastroc stretch with toes on 2" block x 30" - pt reporting better stretch with latter version Standing runner's position soleus stretch at wall 3 x 30" Seated RTB 4-way L ankle x 10 - mild increased pain reported with PF but tolerable Standing RTB 4-way SLR x 10 bil, UE support on back of chair  THERAPEUTIC ACTIVITIES: LE MMT  GAIT TRAINING: To normalize gait pattern. 180 ft with cues for heel strike on weight acceptance and heel-toe progress through stance phase on L foot   08/04/23 THERAPEUTIC EXERCISE: to improve flexibility, strength and mobility.  Demonstration, verbal and tactile cues throughout for technique. Seated gastroc stretch with strap 2x30" BAPS board level 2 L ankle:   -EV/IV with 2.5 lb AL then AM 2 x 10 each  -PF/DF with 2.5 lb posterior 2 x 10  Measured L ankle ROM  Bridge + blue TB hip ABD isometric 2x10 Clamshells blue TB s/l x 10 bil   07/28/23 THERAPEUTIC EXERCISE: to improve flexibility, strength and mobility.  Demonstration, verbal and tactile cues throughout for technique.  Nustep L5 x 6 min L ankle EV,IV,DF YTB 2x10; PF x 10 YTB - tolerable BAPS board level 2 L ankle:  -EV/IV with 2.5 lb AL then AM x 10 each  -PF/DF with 2.5 lb posterior x 10  Seated L hamstring + gastroc stretch w/ strap 2x30" Bridge + GTB hip ABD isometric 2x10 Clamshells GTB s/l x 10 bil    PATIENT EDUCATION:  Education details: HEP review and HEP progression - standing gastroc/soleus stretches & RTB for 4-way ankle   Person educated: Patient Education method: Programmer, multimedia, Demonstration, Verbal cues, and Handouts Education comprehension: verbalized understanding, returned demonstration, verbal cues required, and needs further education  HOME EXERCISE PROGRAM: Access Code: [email or web address] URL: [email or web address] Date: 08/18/2023 Prepared by: Verta Ellen  Exercises - Seated Ankle Inversion Eversion AROM  - 1 x daily - 7 x weekly - 2-3 sets - 10 reps - Seated Table Hamstring Stretch  - 2 x daily - 7 x weekly - 3 sets - 3 reps - 30 sec hold - Seated Ankle Dorsiflexion with Resistance (Mirrored)  - 1 x daily - 7 x weekly - 2 sets - 10 reps - 3 sec hold - Seated Ankle Inversion with Resistance (Mirrored)  - 1 x daily - 7 x weekly - 2 sets - 10 reps - 3 sec hold - Seated Ankle Eversion with Resistance  - 1 x daily - 7 x weekly - 2 sets - 10 reps - 3 sec hold - Bridge with Resistance  - 1 x daily - 7 x weekly - 2 sets - 10 reps - 5 sec hold - Hooklying Clamshell with Resistance  -  1 x daily - 7 x weekly - 2 sets - 10 reps - 3-5 sec hold - Clamshell with Resistance  - 1 x daily - 3-4 x weekly - 2 sets - 10 reps - Gastroc Stretch on Wall  - 2 x daily - 7 x  weekly - 3 reps - 30 sec hold - Soleus Stretch on Wall  - 2 x daily - 7 x weekly - 3 reps - 30 sec hold - Standing Knee Flexion Stretch on Step  - 2 x daily - 7 x weekly - 2 sets - 10 reps - 5 second holds hold - Heel Toe Raises with Counter Support  - 1 x daily - 7 x weekly - 2-3 sets - 10 reps   ASSESSMENT:  CLINICAL IMPRESSION: Pt demonstrates ability to maintain L SLS for at least 10 seconds today, meeting LTG #8. She is continuing to do well out of the CAM boot. Continued with advancement of exercises to improve weight acceptance on LLE and strength for daily task. Good tolerance for the progression of exercises today with no increased pain. Reshell will benefit from continued skilled PT to address ongoing deficits to improve mobility and activity tolerance with decreased pain interference.   OBJECTIVE IMPAIRMENTS: Abnormal gait, decreased activity tolerance, decreased balance, decreased coordination, decreased endurance, decreased knowledge of condition, decreased mobility, difficulty walking, decreased ROM, decreased strength, hypomobility, increased edema, increased fascial restrictions, impaired perceived functional ability, increased muscle spasms, impaired flexibility, postural dysfunction, and pain.   ACTIVITY LIMITATIONS: carrying, lifting, standing, squatting, stairs, bed mobility, and locomotion level  PARTICIPATION LIMITATIONS: meal prep, cleaning, laundry, shopping, community activity, and occupation  PERSONAL FACTORS: Fitness, Past/current experiences, Time since onset of injury/illness/exacerbation, and 3+ comorbidities: history of R foot surgery for heel spurs, plantar fasciitis, and Achilles tendon damage in 04/2011; DM; arthritis; asthma; cardiomyopathy; HTN; hypothyroidism; OSA; obesity; R sciatica  are also affecting patient's functional outcome.   REHAB POTENTIAL: Good  CLINICAL DECISION MAKING: Evolving/moderate complexity  EVALUATION COMPLEXITY:  Moderate   GOALS: Goals reviewed with patient? Yes  SHORT TERM GOALS: Target date: 08/05/2023  Patient will be independent with initial HEP. Baseline:  Goal status: MET - 07/22/23  2.  Patient will report at least 25% improvement in L ankle/foot pain to improve QOL. Baseline: 5-6/10 Goal status: MET - 07/28/23  3.  Patient will improve L ankle DF to at least neutral to promote more normal gait pattern. Baseline: -10 Goal status: MET - 08/04/23   LONG TERM GOALS: Target date: 09/02/2023   Patient will be independent with advanced/ongoing HEP to improve outcomes and carryover.  Baseline:  Goal status: IN PROGRESS  2.  Patient will report at least 50-75% improvement in L ankle/foot pain to improve QOL. Baseline: 5-6/10 Goal status: MET - 08/12/23 - 80-90% improvement   3.  Patient will demonstrate improved L ankle AROM to [facility] to allow for normal gait and stair mechanics. Baseline: Refer to above LE ROM table Goal status: IN PROGRESS  4.  Patient will demonstrate improved B LE strength to >/= 4+/5 for improved stability and ease of mobility. Baseline: Refer to above LE MMT table Goal status: IN PROGRESS - 08/12/23 - overall LE strength improving   5.  Patient will be able to ambulate with normal gait pattern without CAM boot or increased foot/ankle pain to access community.  Baseline: Antalgic gait in CAM boot Goal status: IN PROGRESS  6. Patient will be able to ascend/descend stairs with 1 HR and reciprocal  step pattern safely to access home and community.  Baseline: NT Goal status: IN PROGRESS  7.  Patient will report >/= 50/80 on LEFS to demonstrate improved functional ability. Baseline: 41 / 80 = 51.2 % Goal status: IN PROGRESS  8.  Patient will demonstrate ability to maintain L SLS for at least 10 sec w/o support to demonstrate improved ankle stability. Baseline: SLS: R = 13.63 sec, L = 1.69 sec  Goal status: MET- 08/18/23   PLAN:  PT FREQUENCY: 2x/week  PT  DURATION: 8 weeks  PLANNED INTERVENTIONS: 97164- PT Re-evaluation, 97110-Therapeutic exercises, 97530- Therapeutic activity, 97112- Neuromuscular re-education, 97535- Self Care, 11914- Manual therapy, [id number]- Gait training, [phone]- Orthotic Fit/training, [id number]- Aquatic Therapy, 97014- Electrical stimulation (unattended), [phone]- Electrical stimulation (manual), [id number]- Vasopneumatic device, [id number]- Ultrasound, [id number]- Ionotophoresis 4mg /ml Dexamethasone, Patient/Family education, Balance training, Stair training, Taping, Dry Needling, Joint mobilization, DME instructions, Cryotherapy, and Moist heat  PLAN FOR NEXT SESSION: gently progress L ankle ROM - needs more DF and pain-free ankle strengthening as tolerated; progress proximal LE strengthening; review/update HEP accordingly; MT +/- DN and modalities for pain management as needed   Darleene Cleaver, PTA 08/18/2023, 11:56 AM

## 2023-08-20 ENCOUNTER — Other Ambulatory Visit: Payer: Self-pay

## 2023-08-20 DIAGNOSIS — I5042 Chronic combined systolic (congestive) and diastolic (congestive) heart failure: Secondary | ICD-10-CM

## 2023-08-20 MED ORDER — DAPAGLIFLOZIN PROPANEDIOL 10 MG PO TABS
ORAL_TABLET | ORAL | 0 refills | Status: DC
Start: 2023-08-20 — End: 2023-10-07

## 2023-08-23 ENCOUNTER — Other Ambulatory Visit: Payer: Self-pay | Admitting: Cardiology

## 2023-08-23 DIAGNOSIS — I5042 Chronic combined systolic (congestive) and diastolic (congestive) heart failure: Secondary | ICD-10-CM

## 2023-08-25 ENCOUNTER — Ambulatory Visit: Payer: 59

## 2023-08-25 DIAGNOSIS — R2689 Other abnormalities of gait and mobility: Secondary | ICD-10-CM

## 2023-08-25 DIAGNOSIS — M6281 Muscle weakness (generalized): Secondary | ICD-10-CM

## 2023-08-25 DIAGNOSIS — M25572 Pain in left ankle and joints of left foot: Secondary | ICD-10-CM

## 2023-08-25 NOTE — Therapy (Signed)
OUTPATIENT PHYSICAL THERAPY TREATMENT   Patient Name: Lindsay Hayden MRN: [id number] DOB:1961/07/14, 62 y.o., female Today's Date: 08/25/2023   END OF SESSION:  PT End of Session - 08/25/23 1448     Visit Number 8    Date for PT Re-Evaluation 09/02/23    Authorization Type [employer]    PT Start Time 1405    PT Stop Time 1446    PT Time Calculation (min) 41 min    Activity Tolerance Patient tolerated treatment well    Behavior During Therapy WFL for tasks assessed/performed                   Past Medical History:  Diagnosis Date   Anemia    Arthritis    Asthma    Cardiomyopathy (HCC)    EF 45% on echo 03/2021   Chronic combined systolic and diastolic CHF (congestive heart failure) (HCC)    Diabetes mellitus without complication (HCC)    Hypercalcemia    Hyperlipidemia    Hypertension    Hypothyroid    LBBB (left bundle branch block)    LVH (left ventricular hypertrophy)    NICM (nonischemic cardiomyopathy) (HCC)    OSA (obstructive sleep apnea)    Overweight    Prediabetes    Shortness of breath    Sleep apnea    Past Surgical History:  Procedure Laterality Date   ACHILLES TENDON SURGERY     right   BREAST BIOPSY Right 02/13/2023   MM RT BREAST BX W LOC DEV 1ST LESION IMAGE BX SPEC STEREO GUIDE 02/13/2023 [facility]   CESAREAN SECTION     x2   KNEE ARTHROSCOPY WITH MENISCAL REPAIR     right knee   OVARIAN CYST REMOVAL     right   Patient Active Problem List   Diagnosis Date Noted   DOE (dyspnea on exertion) 02/25/2023   Chronic combined systolic and diastolic CHF (congestive heart failure) (HCC) 11/08/2020   Hypercholesterolemia 09/11/2020   Hypothyroidism 09/11/2020   Mild persistent asthma, uncomplicated 09/11/2020   Osteoarthritis of knee 09/11/2020   Sciatica 09/11/2020   Hyperglycemia 09/11/2020   Hyperlipidemia associated with type 2 diabetes mellitus (HCC) 08/29/2020   Vitamin D deficiency 08/29/2020   Combined systolic and  diastolic congestive heart failure (HCC) 08/09/2020   Diabetes mellitus (HCC) 08/09/2020   DCM (dilated cardiomyopathy) (HCC) 02/15/2019   LBBB (left bundle branch block) 02/15/2019   Hypertension 02/15/2019   Obstructive sleep apnea syndrome 02/15/2019   Class 2 severe obesity with serious comorbidity and body mass index (BMI) of 35.0 to 35.9 in adult (HCC) 02/15/2019    PCP: Sigmund Hazel, MD   REFERRING PROVIDER: Edwin Cap, DPM   REFERRING DIAG: [phone] (ICD-10-CM) - Achilles tendinitis of right lower extremity   THERAPY DIAG:  Pain in left ankle and joints of left foot  Muscle weakness (generalized)  Other abnormalities of gait and mobility  RATIONALE FOR EVALUATION AND TREATMENT: Rehabilitation  ONSET DATE: 1+ yr, worsening as of September 2024  NEXT MD VISIT:  PRN   SUBJECTIVE:  SUBJECTIVE STATEMENT: No real changes.   PAIN: Are you having pain? No and Yes: NPRS scale: 0/10 Pain location: posterior medial L heel Pain description: burning Aggravating factors: walking, stairs Relieving factors: Voltaren, Aleve, ace wrapping  PERTINENT HISTORY:  history of R foot surgery for heel spurs, plantar fasciitis, and Achilles tendon damage in 04/2011; DM; arthritis; asthma; cardiomyopathy; HTN; hypothyroidism; OSA; obesity; R sciatica  PRECAUTIONS: Other: walking boot is recommended for all walking to reduce strain on the tendon to allow soft tissue rest and healing     RED FLAGS: None  WEIGHT BEARING RESTRICTIONS: Yes she may transition out of the cam boot back to regular shoes utilizing a heel lift, she will utilize to these over the next 2 weeks then remove 1 and then remove the final 1 to be flat in her shoe. Per MD note  FALLS:  Has patient fallen in last 6 months?  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 - mixed sitting (60%) > standing (~40%); full standing for all overtime work  PLOF: Independent and Leisure: watching TV, walking 20-30 min - 4 days/wk (had to stop due to L foot pain)   PATIENT GOALS: "Be able to walk w/o pain."   OBJECTIVE: (objective measures completed at initial evaluation unless otherwise dated)  DIAGNOSTIC FINDINGS:  06/24/23 - R foot x-ray: Calcaneal spurs, Haglund deformity, no fracture or stress fracture   PATIENT SURVEYS:  LEFS 41 / 80 = 51.2 %  COGNITION: Overall cognitive status: Within functional limits for tasks assessed    SENSATION: WFL Intermittent numbness in L 2nd toe  EDEMA:  Figure 8: R = 54.0 cm, L = 54.5 cm (has not been on her feet much yet today and was off work yesterday)  MUSCLE LENGTH: Hamstrings:  ITB:  Piriformis:  Hip flexors: mild/mod tight R>L Quads:  Heelcord: mod tight B  POSTURE:  L>R equinovalgus  PALPATION: TTP over Achilles tendon and medial ankle  LOWER EXTREMITY ROM:  Active ROM Right eval Left eval L 08/03/13 L 08/25/23  Ankle dorsiflexion 4 -10 0 8  Ankle plantarflexion 42 41 60 62  Ankle inversion 34 22 40   Ankle eversion 17 12 32    Passive ROM Left eval  Ankle dorsiflexion 4  Ankle plantarflexion   Ankle inversion 30  Ankle eversion 24  (Blank rows = not tested)  LOWER EXTREMITY MMT:  MMT Right eval Left eval R 08/12/23 L 08/12/23  Hip flexion 4+ (pain in R hip) 4- 4+ 4+  Hip extension 4- 3+ 4 4-  Hip abduction 4- 4- 4 4  Hip adduction 4- 4 4 4   Hip internal rotation 3+ 4- 4+ 4+  Hip external rotation 4- (pain in R hip) 4- 4- 4  Knee flexion 4 4-  4+ 4  Knee extension 5 4+ 5 5  Ankle dorsiflexion 5 4- 5 4+  Ankle plantarflexion      Ankle inversion 5 4 5  4+  Ankle eversion 5 3+ 5 4   (Blank rows = not tested)  FUNCTIONAL TESTS:  SLS: R = 13.63 sec, L = 1.69 sec   GAIT: Distance walked: clinic distances Assistive device utilized:  CAM walking boot and None Level of assistance: Complete  Independence Gait pattern: decreased stance time- Left, Right hip hike, and antalgic   TODAY'S TREATMENT:  08/18/23 THERAPEUTIC EXERCISE: to improve flexibility, strength and mobility.  Demonstration, verbal and tactile cues throughout for technique.  Nustep L6x23min  L hip SLR with 2lb weight 2x10 (flex, abduction, extension) Heel raises from 2' book 2x10 Toe raises back to wall 2x10 Standing R/L lunge knee flexion stretch 10x5" bil Measured L ankle AROM  Tandem stance SLS 08/18/23 THERAPEUTIC EXERCISE: to improve flexibility, strength and mobility.  Demonstration, verbal and tactile cues throughout for technique.  Rec Bike L3x10min Standing L runner stretch 2x30" Standing L soleus stretch in runner position 2x30" Assessed SLS on LLE- 25 seconds no UE support Standing heel and toe raises x 10 BLE Knee flexion + ankle DF mobilization on 6' step 10x5" bil Standing RTB 4-way SLR x 12 bil, UE support on back of chair  08/12/23 THERAPEUTIC EXERCISE: to improve flexibility, strength and mobility.  Demonstration, verbal and tactile cues throughout for technique.  NuStep L5 x 6 min Standing runner's position gastroc stretch at wall 3 x 30"stan Standing gastroc stretch with toes on 2" block x 30" - pt reporting better stretch with latter version Standing runner's position soleus stretch at wall 3 x 30" Seated RTB 4-way L ankle x 10 - mild increased pain reported with PF but tolerable Standing RTB 4-way SLR x 10 bil, UE support on back of chair  THERAPEUTIC ACTIVITIES: LE MMT  GAIT TRAINING: To normalize gait pattern. 180 ft with cues for  heel strike on weight acceptance and heel-toe progress through stance phase on L foot   08/04/23 THERAPEUTIC EXERCISE: to improve flexibility, strength and mobility.  Demonstration, verbal and tactile cues throughout for technique. Seated gastroc stretch with strap 2x30" BAPS board level 2 L ankle:  -EV/IV with 2.5 lb AL then AM 2 x 10 each  -PF/DF with 2.5 lb posterior 2 x 10  Measured L ankle ROM  Bridge + blue TB hip ABD isometric 2x10 Clamshells blue TB s/l x 10 bil   07/28/23 THERAPEUTIC EXERCISE: to improve flexibility, strength and mobility.  Demonstration, verbal and tactile cues throughout for technique.  Nustep L5 x 6 min L ankle EV,IV,DF YTB 2x10; PF x 10 YTB - tolerable BAPS board level 2 L ankle:  -EV/IV with 2.5 lb AL then AM x 10 each  -PF/DF with 2.5 lb posterior x 10  Seated L hamstring + gastroc stretch w/ strap 2x30" Bridge + GTB hip ABD isometric 2x10 Clamshells GTB s/l x 10 bil    PATIENT EDUCATION:  Education details: HEP review and HEP progression - standing gastroc/soleus stretches & RTB for 4-way ankle   Person educated: Patient Education method: Programmer, multimedia, Demonstration, Verbal cues, and Handouts Education comprehension: verbalized understanding, returned demonstration, verbal cues required, and needs further education  HOME EXERCISE PROGRAM: Access Code: [email or web address] URL: [email or web address] Date: 08/18/2023 Prepared by: Verta Ellen  Exercises - Seated Ankle Inversion Eversion AROM  - 1 x daily - 7 x weekly - 2-3 sets - 10 reps - Seated Table Hamstring Stretch  - 2 x daily - 7 x weekly - 3 sets - 3 reps - 30 sec hold - Seated Ankle Dorsiflexion with Resistance (Mirrored)  - 1 x daily - 7 x weekly - 2 sets - 10 reps - 3 sec hold - Seated Ankle Inversion with Resistance (Mirrored)  - 1 x daily - 7 x weekly - 2 sets - 10 reps - 3 sec hold - Seated  Ankle Eversion with Resistance  - 1 x daily - 7 x weekly - 2 sets - 10 reps - 3 sec  hold - Bridge with Resistance  - 1 x daily - 7 x weekly - 2 sets - 10 reps - 5 sec hold - Hooklying Clamshell with Resistance  - 1 x daily - 7 x weekly - 2 sets - 10 reps - 3-5 sec hold - Clamshell with Resistance  - 1 x daily - 3-4 x weekly - 2 sets - 10 reps - Gastroc Stretch on Wall  - 2 x daily - 7 x weekly - 3 reps - 30 sec hold - Soleus Stretch on Wall  - 2 x daily - 7 x weekly - 3 reps - 30 sec hold - Standing Knee Flexion Stretch on Step  - 2 x daily - 7 x weekly - 2 sets - 10 reps - 5 second holds hold - Heel Toe Raises with Counter Support  - 1 x daily - 7 x weekly - 2-3 sets - 10 reps   ASSESSMENT:  CLINICAL IMPRESSION: Pt continues to show good tolerance for TE, her L ankle is not an issue but she is now reporting more problems from her knees. Progressed strengthening for proximal LE muscles and ankle to tolerance.  Caye will benefit from continued skilled PT to address ongoing deficits to improve mobility and activity tolerance with decreased pain interference.   OBJECTIVE IMPAIRMENTS: Abnormal gait, decreased activity tolerance, decreased balance, decreased coordination, decreased endurance, decreased knowledge of condition, decreased mobility, difficulty walking, decreased ROM, decreased strength, hypomobility, increased edema, increased fascial restrictions, impaired perceived functional ability, increased muscle spasms, impaired flexibility, postural dysfunction, and pain.   ACTIVITY LIMITATIONS: carrying, lifting, standing, squatting, stairs, bed mobility, and locomotion level  PARTICIPATION LIMITATIONS: meal prep, cleaning, laundry, shopping, community activity, and occupation  PERSONAL FACTORS: Fitness, Past/current experiences, Time since onset of injury/illness/exacerbation, and 3+ comorbidities: history of R foot surgery for heel spurs, plantar fasciitis, and Achilles tendon damage in 04/2011; DM; arthritis; asthma; cardiomyopathy; HTN; hypothyroidism; OSA; obesity; R  sciatica  are also affecting patient's functional outcome.   REHAB POTENTIAL: Good  CLINICAL DECISION MAKING: Evolving/moderate complexity  EVALUATION COMPLEXITY: Moderate   GOALS: Goals reviewed with patient? Yes  SHORT TERM GOALS: Target date: 08/05/2023  Patient will be independent with initial HEP. Baseline:  Goal status: MET - 07/22/23  2.  Patient will report at least 25% improvement in L ankle/foot pain to improve QOL. Baseline: 5-6/10 Goal status: MET - 07/28/23  3.  Patient will improve L ankle DF to at least neutral to promote more normal gait pattern. Baseline: -10 Goal status: MET - 08/04/23   LONG TERM GOALS: Target date: 09/02/2023   Patient will be independent with advanced/ongoing HEP to improve outcomes and carryover.  Baseline:  Goal status: IN PROGRESS  2.  Patient will report at least 50-75% improvement in L ankle/foot pain to improve QOL. Baseline: 5-6/10 Goal status: MET - 08/12/23 - 80-90% improvement   3.  Patient will demonstrate improved L ankle AROM to [facility] to allow for normal gait and stair mechanics. Baseline: Refer to above LE ROM table Goal status: IN PROGRESS-08/25/23 measured  4.  Patient will demonstrate improved B LE strength to >/= 4+/5 for improved stability and ease of mobility. Baseline: Refer to above LE MMT table Goal status: IN PROGRESS - 08/12/23 - overall LE strength improving   5.  Patient will be able to ambulate with normal gait pattern  without CAM boot or increased foot/ankle pain to access community.  Baseline: Antalgic gait in CAM boot Goal status: IN PROGRESS  6. Patient will be able to ascend/descend stairs with 1 HR and reciprocal step pattern safely to access home and community.  Baseline: NT Goal status: IN PROGRESS  7.  Patient will report >/= 50/80 on LEFS to demonstrate improved functional ability. Baseline: 41 / 80 = 51.2 % Goal status: IN PROGRESS  8.  Patient will demonstrate ability to maintain L SLS  for at least 10 sec w/o support to demonstrate improved ankle stability. Baseline: SLS: R = 13.63 sec, L = 1.69 sec  Goal status: MET- 08/18/23   PLAN:  PT FREQUENCY: 2x/week  PT DURATION: 8 weeks  PLANNED INTERVENTIONS: 97164- PT Re-evaluation, 97110-Therapeutic exercises, 97530- Therapeutic activity, 97112- Neuromuscular re-education, 97535- Self Care, 29562- Manual therapy, [id number]- Gait training, [phone]- Orthotic Fit/training, [phone]- Aquatic Therapy, 97014- Electrical stimulation (unattended), [phone]- Electrical stimulation (manual), [id number]- Vasopneumatic device, [id number]- Ultrasound, [id number]- Ionotophoresis 4mg /ml Dexamethasone, Patient/Family education, Balance training, Stair training, Taping, Dry Needling, Joint mobilization, DME instructions, Cryotherapy, and Moist heat  PLAN FOR NEXT SESSION: gently progress L ankle ROM - needs more DF and pain-free ankle strengthening as tolerated; progress proximal LE strengthening; review/update HEP accordingly; MT +/- DN and modalities for pain management as needed   Darleene Cleaver, PTA 08/25/2023, 2:50 PM

## 2023-08-26 ENCOUNTER — Telehealth: Payer: Self-pay

## 2023-08-26 NOTE — Telephone Encounter (Signed)
Call to patient as our office received a fax dated 08/20/23 stating farxiga was not covered by insurance. Patient last seen 08/27/22 by Leda Gauze PA-C and has no f/u scheduled. No answer left detailed message per DPR asking patient to call our office to schedule follow up and to let us know if she is having difficulty getting her farxiga refilled.

## 2023-09-01 ENCOUNTER — Ambulatory Visit: Payer: 59

## 2023-09-01 DIAGNOSIS — M6281 Muscle weakness (generalized): Secondary | ICD-10-CM

## 2023-09-01 DIAGNOSIS — R2689 Other abnormalities of gait and mobility: Secondary | ICD-10-CM

## 2023-09-01 DIAGNOSIS — M25572 Pain in left ankle and joints of left foot: Secondary | ICD-10-CM | POA: Diagnosis not present

## 2023-09-01 NOTE — Therapy (Signed)
OUTPATIENT PHYSICAL THERAPY TREATMENT   Patient Name: Sarahmarie Batcho MRN: [id number] DOB:09/25/1960, 62 y.o., female Today's Date: 09/01/2023   END OF SESSION:  PT End of Session - 09/01/23 1140     Visit Number 9    Date for PT Re-Evaluation 09/02/23    Authorization Type [employer] APWU    PT Start Time 1101    PT Stop Time 1137    PT Time Calculation (min) 36 min    Activity Tolerance Patient tolerated treatment well    Behavior During Therapy WFL for tasks assessed/performed                    Past Medical History:  Diagnosis Date   Anemia    Arthritis    Asthma    Cardiomyopathy (HCC)    EF 45% on echo 03/2021   Chronic combined systolic and diastolic CHF (congestive heart failure) (HCC)    Diabetes mellitus without complication (HCC)    Hypercalcemia    Hyperlipidemia    Hypertension    Hypothyroid    LBBB (left bundle branch block)    LVH (left ventricular hypertrophy)    NICM (nonischemic cardiomyopathy) (HCC)    OSA (obstructive sleep apnea)    Overweight    Prediabetes    Shortness of breath    Sleep apnea    Past Surgical History:  Procedure Laterality Date   ACHILLES TENDON SURGERY     right   BREAST BIOPSY Right 02/13/2023   MM RT BREAST BX W LOC DEV 1ST LESION IMAGE BX SPEC STEREO GUIDE 02/13/2023 [facility]   CESAREAN SECTION     x2   KNEE ARTHROSCOPY WITH MENISCAL REPAIR     right knee   OVARIAN CYST REMOVAL     right   Patient Active Problem List   Diagnosis Date Noted   DOE (dyspnea on exertion) 02/25/2023   Chronic combined systolic and diastolic CHF (congestive heart failure) (HCC) 11/08/2020   Hypercholesterolemia 09/11/2020   Hypothyroidism 09/11/2020   Mild persistent asthma, uncomplicated 09/11/2020   Osteoarthritis of knee 09/11/2020   Sciatica 09/11/2020   Hyperglycemia 09/11/2020   Hyperlipidemia associated with type 2 diabetes mellitus (HCC) 08/29/2020   Vitamin D deficiency 08/29/2020   Combined systolic  and diastolic congestive heart failure (HCC) 08/09/2020   Diabetes mellitus (HCC) 08/09/2020   DCM (dilated cardiomyopathy) (HCC) 02/15/2019   LBBB (left bundle branch block) 02/15/2019   Hypertension 02/15/2019   Obstructive sleep apnea syndrome 02/15/2019   Class 2 severe obesity with serious comorbidity and body mass index (BMI) of 35.0 to 35.9 in adult (HCC) 02/15/2019    PCP: Sigmund Hazel, MD   REFERRING PROVIDER: Edwin Cap, DPM   REFERRING DIAG: [phone] (ICD-10-CM) - Achilles tendinitis of right lower extremity   THERAPY DIAG:  Pain in left ankle and joints of left foot  Muscle weakness (generalized)  Other abnormalities of gait and mobility  RATIONALE FOR EVALUATION AND TREATMENT: Rehabilitation  ONSET DATE: 1+ yr, worsening as of September 2024  NEXT MD VISIT:  PRN   SUBJECTIVE:  SUBJECTIVE STATEMENT: No changes.  PAIN: Are you having pain? No and Yes: NPRS scale: 0/10 Pain location: posterior medial L heel Pain description: burning Aggravating factors: walking, stairs Relieving factors: Voltaren, Aleve, ace wrapping  PERTINENT HISTORY:  history of R foot surgery for heel spurs, plantar fasciitis, and Achilles tendon damage in 04/2011; DM; arthritis; asthma; cardiomyopathy; HTN; hypothyroidism; OSA; obesity; R sciatica  PRECAUTIONS: Other: walking boot is recommended for all walking to reduce strain on the tendon to allow soft tissue rest and healing     RED FLAGS: None  WEIGHT BEARING RESTRICTIONS: Yes she may transition out of the cam boot back to regular shoes utilizing a heel lift, she will utilize to these over the next 2 weeks then remove 1 and then remove the final 1 to be flat in her shoe. Per MD note  FALLS:  Has patient fallen in last 6 months?  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 - mixed sitting (60%) > standing (~40%); full standing for all overtime work  PLOF: Independent and Leisure: watching TV, walking 20-30 min - 4 days/wk (had to stop due to L foot pain)   PATIENT GOALS: "Be able to walk w/o pain."   OBJECTIVE: (objective measures completed at initial evaluation unless otherwise dated)  DIAGNOSTIC FINDINGS:  06/24/23 - R foot x-ray: Calcaneal spurs, Haglund deformity, no fracture or stress fracture   PATIENT SURVEYS:  LEFS 41 / 80 = 51.2 %  COGNITION: Overall cognitive status: Within functional limits for tasks assessed    SENSATION: WFL Intermittent numbness in L 2nd toe  EDEMA:  Figure 8: R = 54.0 cm, L = 54.5 cm (has not been on her feet much yet today and was off work yesterday)  MUSCLE LENGTH: Hamstrings:  ITB:  Piriformis:  Hip flexors: mild/mod tight R>L Quads:  Heelcord: mod tight B  POSTURE:  L>R equinovalgus  PALPATION: TTP over Achilles tendon and medial ankle  LOWER EXTREMITY ROM:  Active ROM Right eval Left eval L 08/03/13 L 08/25/23  Ankle dorsiflexion 4 -10 0 8  Ankle plantarflexion 42 41 60 62  Ankle inversion 34 22 40   Ankle eversion 17 12 32    Passive ROM Left eval  Ankle dorsiflexion 4  Ankle plantarflexion   Ankle inversion 30  Ankle eversion 24  (Blank rows = not tested)  LOWER EXTREMITY MMT:  MMT Right eval Left eval R 08/12/23 L 08/12/23 R 09/01/23 L 09/01/23  Hip flexion 4+ (pain in R hip) 4- 4+ 4+ 5 5  Hip extension 4- 3+ 4 4- 4+ 4+  Hip abduction 4- 4- 4 4 4+ 4+  Hip adduction 4- 4 4 4  4+ 4+  Hip internal rotation 3+ 4- 4+ 4+ 4+ 4+  Hip external  rotation 4- (pain in R hip) 4- 4- 4 4 4   Knee flexion 4 4- 4+ 4 4+ 4+  Knee extension 5 4+ 5 5    Ankle dorsiflexion 5 4- 5 4+    Ankle plantarflexion        Ankle inversion 5 4 5  4+    Ankle eversion 5 3+ 5 4 4+ 4+   (Blank rows = not tested)  FUNCTIONAL TESTS:  SLS: R =  13.63 sec, L = 1.69 sec   GAIT: Distance walked: clinic distances Assistive device utilized:  CAM walking boot and None Level of assistance: Complete Independence Gait pattern: decreased stance time- Left, Right hip hike, and antalgic   TODAY'S TREATMENT:  09/01/23  Nustep L6x54min UE/LE Stairs: 14 stairs ascending/descending- difficulty going down bil-p! Bil knees and L heel Gait Training: 600'- decreased stride length, more WS to R, decreased heel strike LEFS: 68 / 80 = 85.0 % MMT for B LE  08/18/23 THERAPEUTIC EXERCISE: to improve flexibility, strength and mobility.  Demonstration, verbal and tactile cues throughout for technique.  Nustep L6x2min  L hip SLR with 2lb weight 2x10 (flex, abduction, extension) Heel raises from 2' book 2x10 Toe raises back to wall 2x10 Standing R/L lunge knee flexion stretch 10x5" bil Measured L ankle AROM  Tandem stance SLS 08/18/23 THERAPEUTIC EXERCISE: to improve flexibility, strength and mobility.  Demonstration, verbal and tactile cues throughout for technique.  Rec Bike L3x50min Standing L runner stretch 2x30" Standing L soleus stretch in runner position 2x30" Assessed SLS on LLE- 25 seconds no UE support Standing heel and toe raises x 10 BLE Knee flexion + ankle DF mobilization on 6' step 10x5" bil Standing RTB 4-way SLR x 12 bil, UE support on back of chair  08/12/23 THERAPEUTIC EXERCISE: to improve flexibility, strength and mobility.  Demonstration, verbal and tactile cues throughout for technique.  NuStep L5 x 6 min Standing runner's position gastroc stretch at wall 3 x 30"stan Standing gastroc stretch with toes on 2" block x 30" - pt reporting better stretch  with latter version Standing runner's position soleus stretch at wall 3 x 30" Seated RTB 4-way L ankle x 10 - mild increased pain reported with PF but tolerable Standing RTB 4-way SLR x 10 bil, UE support on back of chair  THERAPEUTIC ACTIVITIES: LE MMT  GAIT TRAINING: To normalize gait pattern. 180 ft with cues for heel strike on weight acceptance and heel-toe progress through stance phase on L foot   08/04/23 THERAPEUTIC EXERCISE: to improve flexibility, strength and mobility.  Demonstration, verbal and tactile cues throughout for technique. Seated gastroc stretch with strap 2x30" BAPS board level 2 L ankle:  -EV/IV with 2.5 lb AL then AM 2 x 10 each  -PF/DF with 2.5 lb posterior 2 x 10  Measured L ankle ROM  Bridge + blue TB hip ABD isometric 2x10 Clamshells blue TB s/l x 10 bil   07/28/23 THERAPEUTIC EXERCISE: to improve flexibility, strength and mobility.  Demonstration, verbal and tactile cues throughout for technique.  Nustep L5 x 6 min L ankle EV,IV,DF YTB 2x10; PF x 10 YTB - tolerable BAPS board level 2 L ankle:  -EV/IV with 2.5 lb AL then AM x 10 each  -PF/DF with 2.5 lb posterior x 10  Seated L hamstring + gastroc stretch w/ strap 2x30" Bridge + GTB hip ABD isometric 2x10 Clamshells GTB s/l x 10 bil    PATIENT EDUCATION:  Education details: HEP review and HEP progression - standing gastroc/soleus stretches & RTB for 4-way ankle   Person educated: Patient Education method: Programmer, multimedia, Demonstration, Verbal cues, and Handouts Education comprehension: verbalized understanding, returned demonstration, verbal cues required, and needs further education  HOME EXERCISE PROGRAM: Access Code: [email or web address] URL: [email or web address] Date: 08/18/2023 Prepared by: Verta Ellen  Exercises - Seated Ankle Inversion Eversion AROM  - 1 x daily - 7 x weekly - 2-3 sets - 10 reps - Seated Table Hamstring Stretch  - 2 x  daily - 7 x weekly - 3 sets - 3 reps - 30 sec  hold - Seated Ankle Dorsiflexion with Resistance (Mirrored)  - 1 x daily - 7 x weekly - 2 sets - 10 reps - 3 sec hold - Seated Ankle Inversion with Resistance (Mirrored)  - 1 x daily - 7 x weekly - 2 sets - 10 reps - 3 sec hold - Seated Ankle Eversion with Resistance  - 1 x daily - 7 x weekly - 2 sets - 10 reps - 3 sec hold - Bridge with Resistance  - 1 x daily - 7 x weekly - 2 sets - 10 reps - 5 sec hold - Hooklying Clamshell with Resistance  - 1 x daily - 7 x weekly - 2 sets - 10 reps - 3-5 sec hold - Clamshell with Resistance  - 1 x daily - 3-4 x weekly - 2 sets - 10 reps - Gastroc Stretch on Wall  - 2 x daily - 7 x weekly - 3 reps - 30 sec hold - Soleus Stretch on Wall  - 2 x daily - 7 x weekly - 3 reps - 30 sec hold - Standing Knee Flexion Stretch on Step  - 2 x daily - 7 x weekly - 2 sets - 10 reps - 5 second holds hold - Heel Toe Raises with Counter Support  - 1 x daily - 7 x weekly - 2-3 sets - 10 reps   ASSESSMENT:  CLINICAL IMPRESSION: Pt has progressed well with PT. She is able to navigate stairs safely but with notable deviations. She also shows minor gait deviations but no pain with longer distance walking. Strength is mostly 4+/5 except for B hip ER. LEFS score has improved a lot as well. She has met all LTG's except #4 for LE strength. She is in agreement with a 30 day hold at this time.   OBJECTIVE IMPAIRMENTS: Abnormal gait, decreased activity tolerance, decreased balance, decreased coordination, decreased endurance, decreased knowledge of condition, decreased mobility, difficulty walking, decreased ROM, decreased strength, hypomobility, increased edema, increased fascial restrictions, impaired perceived functional ability, increased muscle spasms, impaired flexibility, postural dysfunction, and pain.   ACTIVITY LIMITATIONS: carrying, lifting, standing, squatting, stairs, bed mobility, and locomotion level  PARTICIPATION LIMITATIONS: meal prep, cleaning, laundry, shopping,  community activity, and occupation  PERSONAL FACTORS: Fitness, Past/current experiences, Time since onset of injury/illness/exacerbation, and 3+ comorbidities: history of R foot surgery for heel spurs, plantar fasciitis, and Achilles tendon damage in 04/2011; DM; arthritis; asthma; cardiomyopathy; HTN; hypothyroidism; OSA; obesity; R sciatica  are also affecting patient's functional outcome.   REHAB POTENTIAL: Good  CLINICAL DECISION MAKING: Evolving/moderate complexity  EVALUATION COMPLEXITY: Moderate   GOALS: Goals reviewed with patient? Yes  SHORT TERM GOALS: Target date: 08/05/2023  Patient will be independent with initial HEP. Baseline:  Goal status: MET - 07/22/23  2.  Patient will report at least 25% improvement in L ankle/foot pain to improve QOL. Baseline: 5-6/10 Goal status: MET - 07/28/23  3.  Patient will improve L ankle DF to at least neutral to promote more normal gait pattern. Baseline: -10 Goal status: MET - 08/04/23   LONG TERM GOALS: Target date: 09/02/2023   Patient will be independent with advanced/ongoing HEP to improve outcomes and carryover.  Baseline:  Goal status: IN PROGRESS  2.  Patient will report at least 50-75% improvement in L ankle/foot pain to improve QOL. Baseline: 5-6/10 Goal status: MET - 08/12/23 - 80-90% improvement   3.  Patient will demonstrate improved L ankle AROM to [facility] to allow for normal gait and stair mechanics. Baseline: Refer to above LE ROM table Goal status: MET-08/25/23   4.  Patient will demonstrate improved B LE strength to >/= 4+/5 for improved stability and ease of mobility. Baseline: Refer to above LE MMT table Goal status: IN PROGRESS -09/01/23- met except for ER bil  5.  Patient will be able to ambulate with normal gait pattern without CAM boot or increased foot/ankle pain to access community.  Baseline: Antalgic gait in CAM boot Goal status: MET- 09/01/23 no increased pain see treatment for details  6. Patient  will be able to ascend/descend stairs with 1 HR and reciprocal step pattern safely to access home and community.  Baseline: NT Goal status: MET- 09/01/23 see treatment for specifics  7.  Patient will report >/= 50/80 on LEFS to demonstrate improved functional ability. Baseline: 41 / 80 = 51.2 % Goal status: MET- 09/01/23  8.  Patient will demonstrate ability to maintain L SLS for at least 10 sec w/o support to demonstrate improved ankle stability. Baseline: SLS: R = 13.63 sec, L = 1.69 sec  Goal status: MET- 08/18/23   PLAN:  PT FREQUENCY: 2x/week  PT DURATION: 8 weeks  PLANNED INTERVENTIONS: 97164- PT Re-evaluation, 97110-Therapeutic exercises, 97530- Therapeutic activity, 97112- Neuromuscular re-education, 97535- Self Care, 40981- Manual therapy, [id number]- Gait training, [phone]- Orthotic Fit/training, [phone]- Aquatic Therapy, 97014- Electrical stimulation (unattended), [phone]- Electrical stimulation (manual), [id number]- Vasopneumatic device, [id number]- Ultrasound, [id number]- Ionotophoresis 4mg /ml Dexamethasone, Patient/Family education, Balance training, Stair training, Taping, Dry Needling, Joint mobilization, DME instructions, Cryotherapy, and Moist heat  PLAN FOR NEXT SESSION: gently progress L ankle ROM - needs more DF and pain-free ankle strengthening as tolerated; progress proximal LE strengthening; review/update HEP accordingly; MT +/- DN and modalities for pain management as needed   Darleene Cleaver, PTA 09/01/2023, 11:40 AM

## 2023-09-08 ENCOUNTER — Other Ambulatory Visit: Payer: Self-pay | Admitting: Cardiology

## 2023-09-08 DIAGNOSIS — I5042 Chronic combined systolic (congestive) and diastolic (congestive) heart failure: Secondary | ICD-10-CM

## 2023-09-16 NOTE — Telephone Encounter (Signed)
Verified that patient has been able to pick up farxiga and also has f/u appt with T. Conte on 10/06/22.

## 2023-09-18 LAB — COLOGUARD: COLOGUARD: NEGATIVE

## 2023-09-18 LAB — EXTERNAL GENERIC LAB PROCEDURE: COLOGUARD: NEGATIVE

## 2023-09-25 ENCOUNTER — Other Ambulatory Visit: Payer: Self-pay | Admitting: Cardiology

## 2023-09-25 DIAGNOSIS — I5042 Chronic combined systolic (congestive) and diastolic (congestive) heart failure: Secondary | ICD-10-CM

## 2023-10-06 NOTE — Progress Notes (Unsigned)
Cardiology Office Note:  .   Date:  10/07/2023  ID:  Lindsay Hayden, DOB 05-Feb-1961, MRN [id number] PCP: Sigmund Hazel, MD  [facility] Providers Cardiologist:  Armanda Magic, MD {  History of Present Illness: .   Lindsay Hayden is a 63 y.o. female with a past medical history of chronic combined CHF, nonischemic cardiomyopathy diagnosed in [place], HTN, HLD, DM2, chronic LBBB, OSA on CPAP here for follow-up appointment.  History includes initially diagnosed in 2006 with nonischemic cardiomyopathy by Dr. Kristine Royal in [place].  Cardiac cath performed at that time showed normal coronary arteries.  A few years later she had a cardiac catheterization in [place] showing similar findings.  Echocardiogram obtained in July 2020 showed EF 35 to 40%, moderate LVH, LBBB, normal RVSP.  Unable to tolerate nighttime dose of hydralazine due to tendinitis.  Cardiac MRI obtained 08/08/2021 showed a moderate LVE, diffuse hypokinesis, EF 43%, no sign of infarction/scar and no evidence of amyloidosis, mild MR, normal RV size and function.  Unable to tolerate higher doses of Entresto due to problems sleeping however she is able to tolerate moderate dose of Entresto.  Repeat echo 03/20/2021 showed EF improved to 4550%, abnormal septal motion is significant from LBBB, no regional wall motion abnormality, trivial MR.  She was seen in the clinic 08/27/2022 and she recently had an infection of RSV which she thinks she got from her grandson.  Using CPAP during the day to help her breathe.  Denied chest pain, palpitations, dyspnea, edema.  Had some wheezing and PCP gave her prednisone for 5 days.  Works at the [employer]   Today, she presents with a history of heart disease and asthma for a follow-up visit. The patient reports difficulty in taking Entresto twice a day due to sleep disturbances. The patient has been managing asthma with Symbicort and albuterol as needed. The patient also reports a history  of Achilles tendonitis, for which she underwent physical therapy. The patient denies any recent heart-related symptoms such as chest pain, shortness of breath, or irregular heartbeats. The patient's blood sugar levels have been lower recently, and the patient's A1c was 6.4 in December. The patient also reports no recent swelling in the feet or hands.  Reports no chest pain, pressure, or tightness. No edema, orthopnea, PND. Reports no palpitations.    Discussed the use of AI scribe software for clinical note transcription with the patient, who gave verbal consent to proceed.  ROS: Pertinent ROS in HPI  Studies Reviewed: Marland Kitchen   EKG Interpretation Date/Time:  Tuesday October 07 2023 10:15:47 EST Ventricular Rate:  63 PR Interval:  198 QRS Duration:  168 QT Interval:  480 QTC Calculation: 491 R Axis:   77  Text Interpretation: Normal sinus rhythm Left bundle branch block When compared with ECG of 20-Jul-2021 09:49, Premature ventricular complexes are no longer Present Confirmed by Jari Favre [phone]) on 10/07/2023 10:31:24 AM         Physical Exam:   VS:  BP 134/84   Pulse 63   Ht 5\' 3"  (1.6 m)   Wt 192 lb 3.2 oz (87.2 kg)   LMP 07/16/2017   SpO2 95%   BMI 34.05 kg/m    Wt Readings from Last 3 Encounters:  10/07/23 192 lb 3.2 oz (87.2 kg)  08/05/23 209 lb (94.8 kg)  02/24/23 209 lb (94.8 kg)    GEN: Well nourished, well developed in no acute distress NECK: No JVD; No carotid bruits CARDIAC: RRR, no  murmurs, rubs, gallops RESPIRATORY:  Clear to auscultation without rales, + inspiratory wheezing in all fields ABDOMEN: Soft, non-tender, non-distended EXTREMITIES:  No edema; No deformity   ASSESSMENT AND PLAN: .    Asthma Wheezing noted on exam, intermittent symptoms. Currently on Symbicort as needed but probably should switch to daily. -Encouraged f/u with Pulmonary  Heart Failure Patient currently taking Entresto once daily due to sleep disturbances when taken twice daily.  Stable left bundle branch block noted on EKG. -Advise to split current Entresto dose (49/51mg ) into two daily doses until current supply is exhausted. -Plan to switch to lower dose (24/26mg ) twice daily after current supply is exhausted. -Continue monitoring blood pressure.  Achilles Tendonitis Resolved with physical therapy and boot immobilization. -No further action required at this time.  Diabetes Recent A1c of 6.4, indicating good control. -Continue Farxiga as prescribed.  Hyperlipidemia Recent LDL of 82, within target range. -Continue current management.       Dispo: She can f/u with Dr. Mayford Knife in a year.   Signed, Sharlene Dory, PA-C

## 2023-10-07 ENCOUNTER — Ambulatory Visit: Payer: Commercial Managed Care - PPO | Attending: Physician Assistant | Admitting: Physician Assistant

## 2023-10-07 ENCOUNTER — Encounter: Payer: Self-pay | Admitting: Physician Assistant

## 2023-10-07 VITALS — BP 134/84 | HR 63 | Ht 63.0 in | Wt 192.2 lb

## 2023-10-07 DIAGNOSIS — I10 Essential (primary) hypertension: Secondary | ICD-10-CM

## 2023-10-07 DIAGNOSIS — I5042 Chronic combined systolic (congestive) and diastolic (congestive) heart failure: Secondary | ICD-10-CM | POA: Diagnosis not present

## 2023-10-07 DIAGNOSIS — E7849 Other hyperlipidemia: Secondary | ICD-10-CM

## 2023-10-07 DIAGNOSIS — E0800 Diabetes mellitus due to underlying condition with hyperosmolarity without nonketotic hyperglycemic-hyperosmolar coma (NKHHC): Secondary | ICD-10-CM

## 2023-10-07 DIAGNOSIS — G4733 Obstructive sleep apnea (adult) (pediatric): Secondary | ICD-10-CM

## 2023-10-07 DIAGNOSIS — I428 Other cardiomyopathies: Secondary | ICD-10-CM

## 2023-10-07 MED ORDER — DAPAGLIFLOZIN PROPANEDIOL 10 MG PO TABS
ORAL_TABLET | ORAL | 3 refills | Status: DC
Start: 2023-10-07 — End: 2024-08-06

## 2023-10-07 MED ORDER — ENTRESTO 24-26 MG PO TABS: 1.0000 | ORAL_TABLET | Freq: Two times a day (BID) | ORAL | 11 refills | Status: DC

## 2023-10-07 NOTE — Patient Instructions (Addendum)
Medication Instructions:  DECREASE Entresto to 24/26 mg twice daily - monitor your blood pressure at home, take 1 hour after your medication about the same time each day  *If you need a refill on your cardiac medications before your next appointment, please call your pharmacy*   Follow-Up: At [facility], you and your health needs are our priority.  As part of our continuing mission to provide you with exceptional heart care, we have created designated Provider Care Teams.  These Care Teams include your primary Cardiologist (physician) and Advanced Practice Providers (APPs -  Physician Assistants and Nurse Practitioners) who all work together to provide you with the care you need, when you need it.  We recommend signing up for the patient portal called "MyChart".  Sign up information is provided on this After Visit Summary.  MyChart is used to connect with patients for Virtual Visits (Telemedicine).  Patients are able to view lab/test results, encounter notes, upcoming appointments, etc.  Non-urgent messages can be sent to your provider as well.   To learn more about what you can do with MyChart, go to [email or web address].    Your next appointment:   1 year(s)  Provider:   Armanda Magic, MD    Other Instructions Please keep follow up with your pulmonologist

## 2023-10-08 ENCOUNTER — Telehealth: Payer: Self-pay | Admitting: Pharmacy Technician

## 2023-10-08 ENCOUNTER — Other Ambulatory Visit (HOSPITAL_COMMUNITY): Payer: Self-pay

## 2023-10-08 NOTE — Telephone Encounter (Signed)
Received notification from [facility] that dapagliflozin 10mg  needed a prior auth. I ran a test claim for brand and brand went through. I called and [facility] submitted for brand for 75.00 for 90 days

## 2023-10-28 ENCOUNTER — Encounter (HOSPITAL_BASED_OUTPATIENT_CLINIC_OR_DEPARTMENT_OTHER): Payer: Self-pay | Admitting: Cardiology

## 2023-10-29 ENCOUNTER — Telehealth: Payer: Self-pay | Admitting: Pharmacy Technician

## 2023-10-29 ENCOUNTER — Other Ambulatory Visit (HOSPITAL_COMMUNITY): Payer: Self-pay

## 2023-10-29 NOTE — Telephone Encounter (Signed)
Pharmacy Patient Advocate Encounter   Received notification from Patient Advice Request messages that prior authorization for Lindsay Hayden is required/requested.   Insurance verification completed.   The patient is insured through [employer] .   Per test claim: The current 10/29/23 day co-pay is, $25.00 one month.  No PA needed at this time. This test claim was processed through [facility] copay amounts may vary at other pharmacies due to pharmacy/plan contracts, or as the patient moves through the different stages of their insurance plan.

## 2023-10-29 NOTE — Telephone Encounter (Signed)
    I called the pharmacy and gave them the coupon information. They are now filling her prescription for 90 days and its 0.00 copay

## 2023-11-10 ENCOUNTER — Other Ambulatory Visit: Payer: Self-pay | Admitting: Cardiology

## 2023-11-10 DIAGNOSIS — I5042 Chronic combined systolic (congestive) and diastolic (congestive) heart failure: Secondary | ICD-10-CM

## 2023-12-05 ENCOUNTER — Other Ambulatory Visit: Payer: Self-pay | Admitting: Cardiology

## 2023-12-05 DIAGNOSIS — I5042 Chronic combined systolic (congestive) and diastolic (congestive) heart failure: Secondary | ICD-10-CM

## 2023-12-24 ENCOUNTER — Other Ambulatory Visit: Payer: Self-pay | Admitting: Cardiology

## 2023-12-24 DIAGNOSIS — I5042 Chronic combined systolic (congestive) and diastolic (congestive) heart failure: Secondary | ICD-10-CM

## 2024-02-23 ENCOUNTER — Other Ambulatory Visit: Payer: Self-pay | Admitting: Family Medicine

## 2024-02-23 DIAGNOSIS — Z1231 Encounter for screening mammogram for malignant neoplasm of breast: Secondary | ICD-10-CM

## 2024-02-24 ENCOUNTER — Ambulatory Visit
Admission: RE | Admit: 2024-02-24 | Discharge: 2024-02-24 | Disposition: A | Discharge status: Home / Self Care | Source: Ambulatory Visit

## 2024-02-24 DIAGNOSIS — Z1231 Encounter for screening mammogram for malignant neoplasm of breast: Secondary | ICD-10-CM

## 2024-08-06 ENCOUNTER — Other Ambulatory Visit: Payer: Self-pay

## 2024-08-06 DIAGNOSIS — I5042 Chronic combined systolic (congestive) and diastolic (congestive) heart failure: Secondary | ICD-10-CM

## 2024-08-09 MED ORDER — DAPAGLIFLOZIN PROPANEDIOL 10 MG PO TABS: ORAL_TABLET | ORAL | 0 refills | Status: DC

## 2024-08-27 ENCOUNTER — Encounter: Payer: Self-pay | Admitting: Cardiology

## 2024-10-11 ENCOUNTER — Other Ambulatory Visit: Payer: Self-pay | Admitting: Cardiology

## 2024-10-11 DIAGNOSIS — I5042 Chronic combined systolic (congestive) and diastolic (congestive) heart failure: Secondary | ICD-10-CM

## 2024-10-17 NOTE — Progress Notes (Unsigned)
 " Cardiology Office Note:    Date:  10/18/2024   ID:  Lindsay Hayden, DOB 13-Feb-1961, MRN [id number]  PCP:  Cleotilde Planas, MD   [facility] Providers Cardiologist:  Wilbert Bihari, MD     Referring MD: Cleotilde Planas, MD   Chief complaint: Annual follow-up of chronic cardiovascular conditions     History of Present Illness:   Lindsay Hayden is a 64 y.o. female with a hx of chronic combined CHF, nonischemic cardiomyopathy diagnosed in [place] , HTN, HLD, DM2, chronic LBBB, OSA on CPAP here for follow-up appointment.   History includes initially diagnosed in 2006 with nonischemic cardiomyopathy by Dr. Debora in [place] .  Cardiac cath performed at that time showed normal coronary arteries.  A few years later she had a cardiac catheterization in [place]  showing similar findings.   Echocardiogram obtained in July 2020 showed EF 35 to 40%, moderate LVH, LBBB, normal RVSP.  Unable to tolerate nighttime dose of hydralazine due to tendinitis.  Cardiac MRI obtained 08/08/2021 showed a moderate LVE, diffuse hypokinesis, EF 43%, no sign of infarction/scar and no evidence of amyloidosis, mild MR, normal RV size and function.  Unable to tolerate higher doses of Entresto  due to problems sleeping however she is able to tolerate moderate dose of Entresto .  Repeat echo 03/20/2021 showed EF improved to 45-50%, abnormal septal motion is significant from LBBB, no regional wall motion abnormality, trivial MR.  Most recently seen by our cardiology services in January 2025 by Orren Fabry, PA, was doing well at that time.  Entresto  decreased due to sleep disturbances.  Presents with her fianc, Bihari, appears stable from a cardiovascular standpoint. She denies chest pain, palpitations, dyspnea, orthopnea, n, v, dark/tarry/bloody stools, hematuria, dizziness, syncope, edema, weight gain.   States she occasionally does not take her evening dose of Entresto , as when she takes it she has a  difficult time falling asleep at night due to hearing her heartbeat in her ear.  When she does not take Entresto  at nighttime, she does not have this issue and sleeps well.  She reports consistent use of her CPAP mask every night when sleeping.  She works at the [employer] in [employer] business [employer].  ROS:   Please see the history of present illness.    All other systems reviewed and are negative.     Past Medical History:  Diagnosis Date   Anemia    Arthritis    Asthma    Cardiomyopathy (HCC)    EF 45% on echo 03/2021   Chronic combined systolic and diastolic CHF (congestive heart failure) (HCC)    Diabetes mellitus without complication (HCC)    Hypercalcemia    Hyperlipidemia    Hypertension    Hypothyroid    LBBB (left bundle branch block)    LVH (left ventricular hypertrophy)    NICM (nonischemic cardiomyopathy) (HCC)    OSA (obstructive sleep apnea)    Overweight    Prediabetes    Shortness of breath    Sleep apnea     Past Surgical History:  Procedure Laterality Date   ACHILLES TENDON SURGERY     right   BREAST BIOPSY Right 02/13/2023   MM RT BREAST BX W LOC DEV 1ST LESION IMAGE BX SPEC STEREO GUIDE 02/13/2023 [facility]   CESAREAN SECTION     x2   KNEE ARTHROSCOPY WITH MENISCAL REPAIR     right knee   OVARIAN CYST REMOVAL     right    Current  Medications: Active Medications[1]   Allergies:   Patient has no known allergies.   Social History   Socioeconomic History   Marital status: Single    Spouse name: Not on file   Number of children: 2   Years of education: AD   Highest education level: Not on file  Occupational History   Occupation: Air Traffic Controller: [employer]  Tobacco Use   Smoking status: Never   Smokeless tobacco: Never  Vaping Use   Vaping status: Never Used  Substance and Sexual Activity   Alcohol use: Yes    Alcohol/week: 1.0 standard drink of alcohol    Types: 1 Glasses of wine per week    Comment: 1-2 times a month   Drug use:  No   Sexual activity: Not on file  Other Topics Concern   Not on file  Social History Narrative   Patient is single, lives with boyfriend in a single story home, though does have a full basement that she rarely goes down the stairs to. No pets.   Social Drivers of Health   Tobacco Use: Low Risk (10/18/2024)   Patient History    Smoking Tobacco Use: Never    Smokeless Tobacco Use: Never    Passive Exposure: Not on file  Financial Resource Strain: Not on file  Food Insecurity: Not on file  Transportation Needs: Not on file  Physical Activity: Not on file  Stress: Not on file  Social Connections: Not on file  Depression (EYV7-0): Not on file  Alcohol Screen: Not on file  Housing: Not on file  Utilities: Not on file  Health Literacy: Not on file     Family History: The patient's family history includes Alcohol abuse in her father; Cancer in her father; Diabetes in her mother; Hypertension in her father and mother; Obesity in her mother; Stroke in her mother.  EKGs/Labs/Other Studies Reviewed:    The following studies were reviewed today:  EKG Interpretation Date/Time:  Monday October 18 2024 13:44:33 EST Ventricular Rate:  66 PR Interval:  196 QRS Duration:  164 QT Interval:  456 QTC Calculation: 478 R Axis:   -22  Text Interpretation: Normal sinus rhythm Possible Left atrial enlargement Left bundle branch block No significant change from prior studies Confirmed by Myleen Brailsford [phone]) on 10/18/2024 1:46:35 PM    Recent Labs: No results found for requested labs within last 365 days.  Recent Lipid Panel    Component Value Date/Time   CHOL 167 08/29/2020 0941   TRIG 62 08/29/2020 0941   HDL 45 08/29/2020 0941   CHOLHDL 3.2 10/07/2019 0847   LDLCALC 110 (H) 08/29/2020 0941     Risk Assessment/Calculations:                Physical Exam:    VS:  BP 110/72   Pulse 66   Ht 5' 3 (1.6 m)   Wt 192 lb (87.1 kg)   LMP 07/16/2017   SpO2 93%   BMI 34.01 kg/m         Wt Readings from Last 3 Encounters:  10/18/24 192 lb (87.1 kg)  10/07/23 192 lb 3.2 oz (87.2 kg)  08/05/23 209 lb (94.8 kg)     GEN:  Well nourished, well developed in no acute distress HEENT: Normal NECK: No JVD; No carotid bruits CARDIAC:  S1-S2 normal, RRR, no murmurs, rubs, gallops RESPIRATORY: Very faint Rales throughout, wheezing or rhonchi  MUSCULOSKELETAL:  No edema; No deformity  SKIN: Warm and  dry NEUROLOGIC:  Alert and oriented x 3 PSYCHIATRIC:  Normal affect       Assessment & Plan Chronic combined systolic and diastolic congestive heart failure (HCC) NICM (nonischemic cardiomyopathy) (HCC) Echo 03/20/2021 showed EF improved to 45-50%, abnormal septal motion is significant from LBBB, no regional wall motion abnormality, trivial MR. EKG: NSR, 66 bpm, chronic LBBB, no significant change from prior studies Asymptomatic, doing well Weight is same as last year, stable No change in stamina Very faint Rales vs mild atelectasis on lung auscultation, will get BNP, low suspicion for volume overload Takes Entresto  only in the morning, no evening dose due to sleep difficulties, same as last year. Advised patient to try and take her evening dose if possible, follow-up with PCP for difficulties with sleep. Continue Entresto  24-26 mg twice daily, spironolactone  25 mg daily, Toprol -XL 37.5 mg daily, Farxiga  10 mg daily, Lasix  20 mg 5 days a week Will update BMP for medication monitoring Could consider repeat echo in the future if new symptoms develop Hyperlipidemia, unspecified hyperlipidemia type Managed by PCP Continue atorvastatin  as prescribed Essential hypertension BPs reported well-controlled at home Continue spironolactone , Entresto , Toprol , Lasix , Farxiga  as above OSA on CPAP Reports nightly CPAP use  Disposition: Follow-up in 1 year or sooner if necessary, proceed to ED with any new or worsening symptoms            Medication Adjustments/Labs and Tests  Ordered: Current medicines are reviewed at length with the patient today.  Concerns regarding medicines are outlined above.  Orders Placed This Encounter  Procedures   Brain natriuretic peptide   Basic metabolic panel with GFR   EKG 87-Ozji   Meds ordered this encounter  Medications   dapagliflozin  propanediol (FARXIGA ) 10 MG TABS tablet    Sig: TAKE 1 TABLET(10 MG) BY MOUTH DAILY    Dispense:  90 tablet    Refill:  0   sacubitril -valsartan  (ENTRESTO ) 24-26 MG    Sig: Take 1 tablet by mouth 2 (two) times daily.    Dispense:  60 tablet    Refill:  11   metoprolol  succinate (TOPROL -XL) 25 MG 24 hr tablet    Sig: Take 1.5 tablets (37.5 mg total) by mouth daily.    Dispense:  135 tablet    Refill:  3   spironolactone  (ALDACTONE ) 25 MG tablet    Sig: Take 1 tablet (25 mg total) by mouth daily.    Dispense:  90 tablet    Refill:  3   furosemide  (LASIX ) 20 MG tablet    Sig: TAKE 1 TABLET FIVE DAYS A WEEK    Dispense:  60 tablet    Refill:  1    Patient Instructions  Medication Instructions:  Your refills have been sent to your selected pharmacies. *If you need a refill on your cardiac medications before your next appointment, please call your pharmacy*  Lab Work: BMET, BNP If you have labs (blood work) drawn today and your tests are completely normal, you will receive your results only by: MyChart Message (if you have MyChart) OR A paper copy in the mail If you have any lab test that is abnormal or we need to change your treatment, we will call you to review the results.  Testing/Procedures: No procedures were ordered during today's visit.   Follow-Up: At [facility], you and your health needs are our priority.  As part of our continuing mission to provide you with exceptional heart care, our providers are all part of one  team.  This team includes your primary Cardiologist (physician) and Advanced Practice Providers or APPs (Physician Assistants and Nurse  Practitioners) who all work together to provide you with the care you need, when you need it.  Your next appointment:   1 year(s)  Provider:   Wilbert Bihari, MD    We recommend signing up for the patient portal called MyChart.  Sign up information is provided on this After Visit Summary.  MyChart is used to connect with patients for Virtual Visits (Telemedicine).  Patients are able to view lab/test results, encounter notes, upcoming appointments, etc.  Non-urgent messages can be sent to your provider as well.   To learn more about what you can do with MyChart, go to [email or web address].   Other Instructions            Signed, Ahni Bradwell E Huda Petrey, NP  10/18/2024 6:48 PM    [facility]     [1]  Current Meds  Medication Sig   acetaminophen  (TYLENOL ) 650 MG CR tablet Take 650 mg by mouth every 8 (eight) hours as needed for pain.   albuterol  (PROVENTIL  HFA;VENTOLIN  HFA) 108 (90 Base) MCG/ACT inhaler Inhale into the lungs every 6 (six) hours as needed for wheezing or shortness of breath.   atorvastatin  (LIPITOR) 20 MG tablet Take 1 tablet (20 mg total) by mouth daily.   budesonide-formoterol (BREYNA) 80-4.5 MCG/ACT inhaler Inhale 2 puffs into the lungs 2 (two) times daily.   Cholecalciferol (VITAMIN D ) 125 MCG (5000 UT) CAPS Take by mouth 5 days per week.   diclofenac  Sodium (VOLTAREN ) 1 % GEL Apply 4 g topically 4 (four) times daily.   fluticasone (FLOVENT HFA) 110 MCG/ACT inhaler Inhale 1 puff into the lungs daily as needed (congestion).   levothyroxine (SYNTHROID) 100 MCG tablet Take 100 mcg by mouth daily before breakfast.   meclizine  (ANTIVERT ) 25 MG tablet Take 1 tablet (25 mg total) by mouth 3 (three) times daily as needed for dizziness.   naproxen sodium (ALEVE) 220 MG tablet Take 220 mg by mouth daily as needed (pain).   RYBELSUS 7 MG TABS Take 1 tablet by mouth every morning.   [DISCONTINUED] dapagliflozin  propanediol (FARXIGA ) 10 MG TABS tablet TAKE 1 TABLET(10  MG) BY MOUTH DAILY   [DISCONTINUED] furosemide  (LASIX ) 20 MG tablet TAKE 1 TABLET FIVE DAYS A WEEK   [DISCONTINUED] metoprolol  succinate (TOPROL -XL) 25 MG 24 hr tablet Take 1.5 tablets (37.5 mg total) by mouth daily.   [DISCONTINUED] sacubitril -valsartan  (ENTRESTO ) 24-26 MG Take 1 tablet by mouth 2 (two) times daily.   [DISCONTINUED] spironolactone  (ALDACTONE ) 25 MG tablet TAKE 1 TABLET DAILY (MUST KEEP UPCOMING APPOINTMENT IN JANUARY 2025 WITH CARDIOLOGY BEFORE ANYMORE REFILLS, FINAL ATTEMPT)   "

## 2024-10-18 ENCOUNTER — Ambulatory Visit: Admitting: Emergency Medicine

## 2024-10-18 ENCOUNTER — Encounter: Payer: Self-pay | Admitting: Emergency Medicine

## 2024-10-18 VITALS — BP 110/72 | HR 66 | Ht 63.0 in | Wt 192.0 lb

## 2024-10-18 DIAGNOSIS — I5040 Unspecified combined systolic (congestive) and diastolic (congestive) heart failure: Secondary | ICD-10-CM | POA: Diagnosis not present

## 2024-10-18 DIAGNOSIS — E785 Hyperlipidemia, unspecified: Secondary | ICD-10-CM | POA: Diagnosis not present

## 2024-10-18 DIAGNOSIS — I428 Other cardiomyopathies: Secondary | ICD-10-CM

## 2024-10-18 DIAGNOSIS — I10 Essential (primary) hypertension: Secondary | ICD-10-CM | POA: Diagnosis not present

## 2024-10-18 DIAGNOSIS — I5042 Chronic combined systolic (congestive) and diastolic (congestive) heart failure: Secondary | ICD-10-CM

## 2024-10-18 DIAGNOSIS — G4733 Obstructive sleep apnea (adult) (pediatric): Secondary | ICD-10-CM

## 2024-10-18 MED ORDER — SACUBITRIL-VALSARTAN 24-26 MG PO TABS: 1.0000 | ORAL_TABLET | Freq: Two times a day (BID) | ORAL | 11 refills | Status: AC

## 2024-10-18 MED ORDER — SPIRONOLACTONE 25 MG PO TABS: 25.0000 mg | ORAL_TABLET | Freq: Every day | ORAL | 3 refills | Status: AC

## 2024-10-18 MED ORDER — DAPAGLIFLOZIN PROPANEDIOL 10 MG PO TABS: ORAL_TABLET | ORAL | 0 refills | Status: AC

## 2024-10-18 MED ORDER — FUROSEMIDE 20 MG PO TABS: ORAL_TABLET | ORAL | 1 refills | Status: AC

## 2024-10-18 MED ORDER — METOPROLOL SUCCINATE ER 25 MG PO TB24: 37.5000 mg | ORAL_TABLET | Freq: Every day | ORAL | 3 refills | Status: AC

## 2024-10-18 NOTE — Assessment & Plan Note (Signed)
 Echo 03/20/2021 showed EF improved to 45-50%, abnormal septal motion is significant from LBBB, no regional wall motion abnormality, trivial MR. EKG: NSR, 66 bpm, chronic LBBB, no significant change from prior studies Asymptomatic, doing well Weight is same as last year, stable No change in stamina Very faint Rales vs mild atelectasis on lung auscultation, will get BNP, low suspicion for volume overload Takes Entresto  only in the morning, no evening dose due to sleep difficulties, same as last year. Advised patient to try and take her evening dose if possible, follow-up with PCP for difficulties with sleep. Continue Entresto  24-26 mg twice daily, spironolactone  25 mg daily, Toprol -XL 37.5 mg daily, Farxiga  10 mg daily, Lasix  20 mg 5 days a week Will update BMP for medication monitoring Could consider repeat echo in the future if new symptoms develop

## 2024-10-18 NOTE — Patient Instructions (Addendum)
 Medication Instructions:  Your refills have been sent to your selected pharmacies. *If you need a refill on your cardiac medications before your next appointment, please call your pharmacy*  Lab Work: BMET, BNP If you have labs (blood work) drawn today and your tests are completely normal, you will receive your results only by: MyChart Message (if you have MyChart) OR A paper copy in the mail If you have any lab test that is abnormal or we need to change your treatment, we will call you to review the results.  Testing/Procedures: No procedures were ordered during today's visit.   Follow-Up: At [facility], you and your health needs are our priority.  As part of our continuing mission to provide you with exceptional heart care, our providers are all part of one team.  This team includes your primary Cardiologist (physician) and Advanced Practice Providers or APPs (Physician Assistants and Nurse Practitioners) who all work together to provide you with the care you need, when you need it.  Your next appointment:   1 year(s)  Provider:   Wilbert Bihari, MD    We recommend signing up for the patient portal called MyChart.  Sign up information is provided on this After Visit Summary.  MyChart is used to connect with patients for Virtual Visits (Telemedicine).  Patients are able to view lab/test results, encounter notes, upcoming appointments, etc.  Non-urgent messages can be sent to your provider as well.   To learn more about what you can do with MyChart, go to [email or web address].   Other Instructions

## 2024-10-19 LAB — BASIC METABOLIC PANEL WITH GFR
BUN/Creatinine Ratio: 26 (ref 12–28)
BUN: 23 mg/dL (ref 8–27)
CO2: 21 mmol/L (ref 20–29)
Calcium: 10.7 mg/dL — ABNORMAL HIGH (ref 8.7–10.3)
Chloride: 107 mmol/L — ABNORMAL HIGH (ref 96–106)
Creatinine, Ser: 0.9 mg/dL (ref 0.57–1.00)
Glucose: 85 mg/dL (ref 70–99)
Potassium: 4.5 mmol/L (ref 3.5–5.2)
Sodium: 141 mmol/L (ref 134–144)
eGFR: 73 mL/min/{1.73_m2}

## 2024-10-19 LAB — BRAIN NATRIURETIC PEPTIDE: BNP: 28.3 pg/mL (ref 0.0–100.0)

## 2024-10-20 ENCOUNTER — Ambulatory Visit: Payer: Self-pay | Admitting: Emergency Medicine
